# Patient Record
Sex: Female | Born: 1950 | Race: Black or African American | Hispanic: No | Smoking: Never smoker
Health system: Southern US, Community
[De-identification: ages and names within clinical notes are randomized; demographics above are authoritative.]

## PROBLEM LIST (undated history)

## (undated) DIAGNOSIS — C7951 Secondary malignant neoplasm of bone: Secondary | ICD-10-CM

## (undated) DIAGNOSIS — R011 Cardiac murmur, unspecified: Secondary | ICD-10-CM

## (undated) DIAGNOSIS — R600 Localized edema: Secondary | ICD-10-CM

## (undated) DIAGNOSIS — F419 Anxiety disorder, unspecified: Secondary | ICD-10-CM

## (undated) DIAGNOSIS — E785 Hyperlipidemia, unspecified: Secondary | ICD-10-CM

## (undated) DIAGNOSIS — E119 Type 2 diabetes mellitus without complications: Secondary | ICD-10-CM

## (undated) DIAGNOSIS — M1990 Unspecified osteoarthritis, unspecified site: Secondary | ICD-10-CM

## (undated) DIAGNOSIS — R609 Edema, unspecified: Secondary | ICD-10-CM

## (undated) DIAGNOSIS — R5383 Other fatigue: Secondary | ICD-10-CM

## (undated) HISTORY — PX: COLONOSCOPY: SHX174

## (undated) HISTORY — PX: CHOLECYSTECTOMY: SHX55

## (undated) HISTORY — PX: LAPAROSCOPIC ABDOMINAL EXPLORATION: SHX6249

---

## 2014-10-06 ENCOUNTER — Emergency Department: Payer: Self-pay | Admitting: Emergency Medicine

## 2020-03-21 DIAGNOSIS — E7800 Pure hypercholesterolemia, unspecified: Secondary | ICD-10-CM | POA: Insufficient documentation

## 2020-03-21 DIAGNOSIS — E1169 Type 2 diabetes mellitus with other specified complication: Secondary | ICD-10-CM | POA: Insufficient documentation

## 2020-04-02 ENCOUNTER — Encounter: Payer: Medicare PPO | Attending: Physician Assistant | Admitting: Physician Assistant

## 2020-04-02 ENCOUNTER — Other Ambulatory Visit: Payer: Self-pay

## 2020-04-02 DIAGNOSIS — Z87891 Personal history of nicotine dependence: Secondary | ICD-10-CM | POA: Diagnosis not present

## 2020-04-02 DIAGNOSIS — M1610 Unilateral primary osteoarthritis, unspecified hip: Secondary | ICD-10-CM | POA: Insufficient documentation

## 2020-04-02 DIAGNOSIS — E11622 Type 2 diabetes mellitus with other skin ulcer: Secondary | ICD-10-CM | POA: Diagnosis not present

## 2020-04-02 DIAGNOSIS — L98492 Non-pressure chronic ulcer of skin of other sites with fat layer exposed: Secondary | ICD-10-CM | POA: Insufficient documentation

## 2020-04-03 NOTE — Progress Notes (Signed)
Kristy York, Kristy York ([id number]) Visit Report for 04/02/2020 Abuse/Suicide Risk Screen Details Patient Name: Kristy York, Kristy York Date of Service: 04/02/2020 12:45 PM Medical Record Number: [id number] Patient Account Number: [id number] Date of Birth/Sex: 1951-07-08 (69 y.o. F) Treating RN: Cornell Barman Primary Care Vibhav Waddill: Dion Body Other Clinician: Referring Lynda Wanninger: Dion Body Treating Mickaela Starlin/Extender: Melburn Hake, HOYT Weeks in Treatment: 0 Abuse/Suicide Risk Screen Items Answer ABUSE RISK SCREEN: Has anyone close to you tried to hurt or harm you recentlyo No Do you feel uncomfortable with anyone in your familyo No Has anyone forced you do things that you didnot want to doo No Electronic Signature(s) Signed: 04/03/2020 12:55:53 PM By: Gretta Cool, BSN, RN, CWS, Kim RN, BSN Entered By: Gretta Cool, BSN, RN, CWS, Kim on 04/02/2020 13:08:31 Kristy York ([id number]) -------------------------------------------------------------------------------- Activities of Daily Living Details Patient Name: Kristy York Date of Service: 04/02/2020 12:45 PM Medical Record Number: [id number] Patient Account Number: [id number] Date of Birth/Sex: [birth year] (69 y.o. F) Treating RN: Cornell Barman Primary Care Makyna Niehoff: Dion Body Other Clinician: Referring Curly Mackowski: Dion Body Treating Baillie Mohammad/Extender: Melburn Hake, HOYT Weeks in Treatment: 0 Activities of Daily Living Items Answer Activities of Daily Living (Please select one for each item) Drive Automobile Completely Able Take Medications Completely Able Use Telephone Completely Able Care for Appearance Completely Able Use Toilet Completely Able Bath / Shower Completely Able Dress Self Completely Able Feed Self Completely Able Walk Completely Able Get In / Out Bed Completely Able Housework Completely Able Prepare Meals Completely [place] for Self Completely Able Electronic Signature(s) Signed: 04/03/2020  12:55:53 PM By: Gretta Cool, BSN, RN, CWS, Kim RN, BSN Entered By: Gretta Cool, BSN, RN, CWS, Kim on 04/02/2020 13:08:42 Kristy York ([id number]) -------------------------------------------------------------------------------- Education Screening Details Patient Name: Kristy York Date of Service: 04/02/2020 12:45 PM Medical Record Number: [id number] Patient Account Number: [id number] Date of Birth/Sex: 06-25-1951 (69 y.o. F) Treating RN: Cornell Barman Primary Care Aubrianne Molyneux: Dion Body Other Clinician: Referring Roxy Filler: Dion Body Treating Nichola Warren/Extender: Melburn Hake, HOYT Weeks in Treatment: 0 Primary Learner Assessed: Patient Learning Preferences/Education Level/Primary Language Learning Preference: Explanation, Demonstration Highest Education Level: High School Preferred Language: English Cognitive Barrier Language Barrier: No Translator Needed: No Memory Deficit: No Emotional Barrier: No Cultural/Religious Beliefs Affecting Medical Care: No Physical Barrier Impaired Vision: No Impaired Hearing: No Decreased Hand dexterity: No Knowledge/Comprehension Knowledge Level: Medium Comprehension Level: Medium Ability to understand written instructions: Medium Ability to understand verbal instructions: Medium Motivation Anxiety Level: Calm Cooperation: Cooperative Education Importance: Acknowledges Need Interest in Health Problems: Asks Questions Perception: Coherent Willingness to Engage in Self-Management High Activities: Readiness to Engage in Self-Management High Activities: Engineer, maintenance) Signed: 04/03/2020 12:55:53 PM By: Gretta Cool, BSN, RN, CWS, Kim RN, BSN Entered By: Gretta Cool, BSN, RN, CWS, Kim on 04/02/2020 13:09:17 Kristy York ([id number]) -------------------------------------------------------------------------------- Fall Risk Assessment Details Patient Name: Kristy York Date of Service: 04/02/2020 12:45 PM Medical Record Number: [id number] Patient  Account Number: [id number] Date of Birth/Sex: 08/12/1951 (69 y.o. F) Treating RN: Cornell Barman Primary Care Dulcy Sida: Dion Body Other Clinician: Referring Benuel Ly: Dion Body Treating Nyra Anspaugh/Extender: Melburn Hake, HOYT Weeks in Treatment: 0 Fall Risk Assessment Items Have you had 2 or more falls in the last 12 monthso 0 No Have you had any fall that resulted in injury in the last 12 monthso 0 No FALLS RISK SCREEN History of falling - immediate or within 3 months 0 No Secondary diagnosis (Do you have 2 or more medical diagnoseso) 0 No Ambulatory aid None/bed rest/wheelchair/nurse 0 Yes Crutches/cane/walker 0 No Furniture 0 No  Intravenous therapy Access/Saline/Heparin Lock 0 No Gait/Transferring Normal/ bed rest/ wheelchair 0 Yes Weak (short steps with or without shuffle, stooped but able to lift head while walking, may 0 No seek support from furniture) Impaired (short steps with shuffle, may have difficulty arising from chair, head down, impaired 0 No balance) Mental Status Oriented to own ability 0 Yes Electronic Signature(s) Signed: 04/03/2020 12:55:53 PM By: Gretta Cool, BSN, RN, CWS, Kim RN, BSN Entered By: Gretta Cool, BSN, RN, CWS, Kim on 04/02/2020 13:09:28 Kristy York ([id number]) -------------------------------------------------------------------------------- Foot Assessment Details Patient Name: Kristy York Date of Service: 04/02/2020 12:45 PM Medical Record Number: [id number] Patient Account Number: [id number] Date of Birth/Sex: [birth year] (69 y.o. F) Treating RN: Cornell Barman Primary Care Lashea Goda: Dion Body Other Clinician: Referring Hudsyn Champine: Dion Body Treating Ashlin Kreps/Extender: Melburn Hake, HOYT Weeks in Treatment: 0 Foot Assessment Items Site Locations + = Sensation present, - = Sensation absent, C = Callus, U = Ulcer R = Redness, W = Warmth, M = Maceration, PU = Pre-ulcerative lesion F = Fissure, S = Swelling, D =  Dryness Assessment Right: Left: Other Deformity: No No Prior Foot Ulcer: No No Prior Amputation: No No Charcot Joint: No No Ambulatory Status: Ambulatory Without Help Gait: Steady Electronic Signature(s) Signed: 04/03/2020 12:55:53 PM By: Gretta Cool, BSN, RN, CWS, Kim RN, BSN Entered By: Gretta Cool, BSN, RN, CWS, Kim on 04/02/2020 13:09:48 Kristy York ([id number]) -------------------------------------------------------------------------------- Nutrition Risk Screening Details Patient Name: Kristy York Date of Service: 04/02/2020 12:45 PM Medical Record Number: [id number] Patient Account Number: [id number] Date of Birth/Sex: 1951-08-05 (69 y.o. F) Treating RN: Cornell Barman Primary Care Caroleena Paolini: Dion Body Other Clinician: Referring Josue Kass: Dion Body Treating Chalisa Kobler/Extender: Melburn Hake, HOYT Weeks in Treatment: 0 Height (in): 63 Weight (lbs): 168 Body Mass Index (BMI): 29.8 Nutrition Risk Screening Items Score Screening NUTRITION RISK SCREEN: I have an illness or condition that made me change the kind and/or amount of food I eat 0 No I eat fewer than two meals per day 0 No I eat few fruits and vegetables, or milk products 0 No I have three or more drinks of beer, liquor or wine almost every day 0 No I have tooth or mouth problems that make it hard for me to eat 0 No I don't always have enough money to buy the food I need 0 No I eat alone most of the time 0 No I take three or more different prescribed or over-the-counter drugs a day 0 No Without wanting to, I have lost or gained 10 pounds in the last six months 0 No I am not always physically able to shop, cook and/or feed myself 0 No Nutrition Protocols Good Risk Protocol 0 No interventions needed Moderate Risk Protocol High Risk Proctocol Risk Level: Good Risk Score: 0 Electronic Signature(s) Signed: 04/03/2020 12:55:53 PM By: Gretta Cool, BSN, RN, CWS, Kim RN, BSN Entered By: Gretta Cool, BSN, RN, CWS, Kim on 04/02/2020  13:09:37

## 2020-04-03 NOTE — Progress Notes (Signed)
Kristy York, Kristy York ([id number]) Visit Report for 04/02/2020 Allergy List Details Patient Name: Kristy York, Kristy York Date of Service: 04/02/2020 12:45 PM Medical Record Number: [id number] Patient Account Number: [id number] Date of Birth/Sex: 1951-06-22 (69 y.o. F) Treating RN: Kristy York Primary Care Kristy York: Kristy York Other Clinician: Referring Kristy York: Kristy York Treating Kristy York/Extender: Kristy York Weeks in Treatment: 0 Allergies Active Allergies No Known Drug Allergies Allergy Notes Electronic Signature(s) Signed: 04/03/2020 12:55:53 PM By: Kristy York, BSN, RN, CWS, Kristy York Entered By: Kristy York, BSN, RN, CWS, Kristy on 04/02/2020 13:04:58 Kristy York ([id number]) -------------------------------------------------------------------------------- Arrival Information Details Patient Name: Kristy York Date of Service: 04/02/2020 12:45 PM Medical Record Number: [id number] Patient Account Number: [id number] Date of Birth/Sex: 29-Sep-1951 (69 y.o. F) Treating RN: Kristy York Primary Care Kristy York: Kristy York Other Clinician: Referring Kristy York: Kristy York Treating Kristy York/Extender: Kristy York Weeks in Treatment: 0 Visit Information Patient Arrived: Kristy York Arrival Time: 12:51 Accompanied By: self Transfer Assistance: None Patient Identification Verified: Yes Secondary Verification Process Completed: Yes Electronic Signature(s) Signed: 04/02/2020 4:25:49 PM By: Kristy York Entered By: Kristy Bears on 04/02/2020 12:52:08 Kristy York ([id number]) -------------------------------------------------------------------------------- Clinic Level of Care Assessment Details Patient Name: Kristy York Date of Service: 04/02/2020 12:45 PM Medical Record Number: [id number] Patient Account Number: [id number] Date of Birth/Sex: Jan 17, 1951 (69 y.o. F) Treating RN: Kristy York Primary Care Kristy York: Kristy York Other  Clinician: Referring Kristy York: Kristy York Treating Kristy York/Extender: Kristy York Weeks in Treatment: 0 Clinic Level of Care Assessment Items TOOL 2 Quantity Score []  - Use when only an EandM is performed on the INITIAL visit 0 ASSESSMENTS - Nursing Assessment / Reassessment X - General Physical Exam (combine w/ comprehensive assessment (listed just below) when performed on new 1 20 pt. evals) X- 1 25 Comprehensive Assessment (HX, ROS, Risk Assessments, Wounds Hx, etc.) ASSESSMENTS - Wound and Skin Assessment / Reassessment X - Simple Wound Assessment / Reassessment - one wound 1 5 []  - 0 Complex Wound Assessment / Reassessment - multiple wounds []  - 0 Dermatologic / Skin Assessment (not related to wound area) ASSESSMENTS - Ostomy and/or Continence Assessment and Care []  - Incontinence Assessment and Management 0 []  - 0 Ostomy Care Assessment and Management (repouching, etc.) PROCESS - Coordination of Care X - Simple Patient / Family Education for ongoing care 1 15 []  - 0 Complex (extensive) Patient / Family Education for ongoing care X- 1 10 Staff obtains Programmer, systems, Records, Test Results / Process Orders []  - 0 Staff telephones HHA, Nursing Homes / Clarify orders / etc []  - 0 Routine Transfer to another Facility (non-emergent condition) []  - 0 Routine Hospital Admission (non-emergent condition) []  - 0 New Admissions / Biomedical engineer / Ordering NPWT, Apligraf, etc. []  - 0 Emergency Hospital Admission (emergent condition) X- 1 10 Simple Discharge Coordination []  - 0 Complex (extensive) Discharge Coordination PROCESS - Special Needs []  - Pediatric / Minor Patient Management 0 []  - 0 Isolation Patient Management []  - 0 Hearing / Language / Visual special needs []  - 0 Assessment of Community assistance (transportation, D/C planning, etc.) []  - 0 Additional assistance / Altered mentation []  - 0 Support Surface(s) Assessment (bed, cushion, seat,  etc.) INTERVENTIONS - Wound Cleansing / Measurement X - Wound Imaging (photographs - any number of wounds) 1 5 []  - 0 Wound Tracing (instead of photographs) X- 1 5 Simple Wound Measurement - one wound []  - 0 Complex Wound Measurement - multiple wounds Kristy York ([id number]) X- 1 5 Simple Wound Cleansing -  one wound []  - 0 Complex Wound Cleansing - multiple wounds INTERVENTIONS - Wound Dressings []  - Small Wound Dressing one or multiple wounds 0 []  - 0 Medium Wound Dressing one or multiple wounds X- 1 20 Large Wound Dressing one or multiple wounds []  - 0 Application of Medications - injection INTERVENTIONS - Miscellaneous []  - External ear exam 0 []  - 0 Specimen Collection (cultures, biopsies, blood, York fluids, etc.) []  - 0 Specimen(s) / Culture(s) sent or taken to Lab for analysis []  - 0 Patient Transfer (multiple staff / Kristy York Lift / Similar devices) []  - 0 Simple Staple / Suture removal (25 or less) []  - 0 Complex Staple / Suture removal (26 or more) []  - 0 Hypo / Hyperglycemic Management (close monitor of Blood Glucose) []  - 0 Ankle / Brachial Index (ABI) - do not check if billed separately Has the patient been seen at the hospital within the last three years: Yes Total Score: 120 Level Of Care: New/Established - Level 4 Electronic Signature(s) Signed: 04/03/2020 12:55:53 PM By: Kristy York, BSN, RN, CWS, Kristy York Entered By: Kristy York, BSN, RN, CWS, Kristy on 04/02/2020 17:28:26 Kristy York ([id number]) -------------------------------------------------------------------------------- Encounter Discharge Information Details Patient Name: Kristy York Date of Service: 04/02/2020 12:45 PM Medical Record Number: [id number] Patient Account Number: [id number] Date of Birth/Sex: Jul 11, 1951 (69 y.o. F) Treating RN: Kristy York Primary Care Derel York: Kristy York Other Clinician: Referring Kristy York: Kristy York Treating Kristy York/Extender: Kristy York Weeks  in Treatment: 0 Encounter Discharge Information Items Post Procedure Vitals Discharge Condition: Stable Temperature (F): 98.4 Ambulatory Status: Ambulatory Pulse (bpm): 86 Discharge Destination: Home Respiratory Rate (breaths/min): 18 Transportation: Other Blood Pressure (mmHg): 139/72 Accompanied By: self Schedule Follow-up Appointment: Yes Clinical Summary of Care: Electronic Signature(s) Signed: 04/02/2020 5:33:18 PM By: Kristy York, BSN, RN, CWS, Kristy York Entered By: Kristy York, BSN, RN, CWS, Kristy on 04/02/2020 17:33:18 Kristy York ([id number]) -------------------------------------------------------------------------------- Lower Extremity Assessment Details Patient Name: Kristy York Date of Service: 04/02/2020 12:45 PM Medical Record Number: [id number] Patient Account Number: [id number] Date of Birth/Sex: [birth year] (69 y.o. F) Treating RN: Kristy York Primary Care Zalaya Astarita: Kristy York Other Clinician: Referring Gelena Klosinski: Kristy York Treating Raegyn Renda/Extender: Kristy York Weeks in Treatment: 0 Electronic Signature(s) Signed: 04/03/2020 12:55:53 PM By: Kristy York, BSN, RN, CWS, Kristy York Entered By: Kristy York, BSN, RN, CWS, Kristy on 04/02/2020 13:40:39 Kristy York ([id number]) -------------------------------------------------------------------------------- Multi Wound Chart Details Patient Name: Kristy York Date of Service: 04/02/2020 12:45 PM Medical Record Number: [id number] Patient Account Number: [id number] Date of Birth/Sex: 06-23-1951 (69 y.o. F) Treating RN: Kristy York Primary Care Antoine Fiallos: Kristy York Other Clinician: Referring Anaclara Acklin: Kristy York Treating Margia Wiesen/Extender: Kristy York Weeks in Treatment: 0 Vital Signs Height(in): 63 Pulse(bpm): 70 Weight(lbs): 168 Blood Pressure(mmHg): 139/72 York Mass Index(BMI): 30 Temperature(F): 98.4 Respiratory Rate(breaths/min): 18 Photos: [N/A:N/A] Wound Location: Right Breast N/A  N/A Wounding Event: Gradually Appeared N/A N/A Primary Etiology: Malignant Wound N/A N/A Comorbid History: Type II Diabetes, Osteoarthritis N/A N/A Date Acquired: 03/02/2020 N/A N/A Weeks of Treatment: 0 N/A N/A Wound Status: Open N/A N/A Measurements L x W x D (cm) 2x5x1.5 N/A N/A Area (cm) : 7.854 N/A N/A Volume (cm) : 11.781 N/A N/A % Reduction in Area: 0.00% N/A N/A % Reduction in Volume: 0.00% N/A N/A Classification: Unclassifiable N/A N/A Exudate Amount: Large N/A N/A Exudate Type: Sanguinous N/A N/A Exudate Color: red N/A N/A Wound Margin: Thickened N/A N/A Granulation Amount: None Present (0%) N/A N/A Necrotic Amount: Large (67-100%) N/A N/A Necrotic Tissue: Eschar,  Adherent Slough N/A N/A Exposed Structures: Fat Layer (Subcutaneous Tissue) N/A N/A Exposed: Yes Epithelialization: None N/A N/A Treatment Notes Electronic Signature(s) Signed: 04/02/2020 5:26:52 PM By: Kristy York, BSN, RN, CWS, Kristy York Entered By: Kristy York, BSN, RN, CWS, Kristy on 04/02/2020 17:26:52 Kristy York ([id number]) -------------------------------------------------------------------------------- Pain Assessment Details Patient Name: Kristy York Date of Service: 04/02/2020 12:45 PM Medical Record Number: [id number] Patient Account Number: [id number] Date of Birth/Sex: [birth year] (69 y.o. F) Treating RN: Kristy York Primary Care Reggie Bise: Kristy York Other Clinician: Referring Brianah Hopson: Kristy York Treating Quron Ruddy/Extender: Kristy York Weeks in Treatment: 0 Active Problems Location of Pain Severity and Description of Pain Patient Has Paino No Site Locations Pain Management and Medication Current Pain Management: Electronic Signature(s) Signed: 04/02/2020 4:25:49 PM By: Kristy York Signed: 04/03/2020 12:55:53 PM By: Kristy York, BSN, RN, CWS, Kristy York Entered By: Kristy Bears on 04/02/2020 12:52:22 Kristy York  ([id number]) -------------------------------------------------------------------------------- Patient/Caregiver Education Details Patient Name: Kristy York Date of Service: 04/02/2020 12:45 PM Medical Record Number: [id number] Patient Account Number: [id number] Date of Birth/Gender: 1950-10-31 (69 y.o. F) Treating RN: Kristy York Primary Care Physician: Kristy York Other Clinician: Referring Physician: Dion York Treating Physician/Extender: Sharalyn Ink in Treatment: 0 Education Assessment Education Provided To: Patient Education Topics Provided Malignant/Atypical Wounds: Handouts: Malignant/Atypical Wounds, Other: wound care Methods: Demonstration Responses: State content correctly Electronic Signature(s) Signed: 04/03/2020 12:55:53 PM By: Kristy York, BSN, RN, CWS, Kristy York Entered By: Kristy York, BSN, RN, CWS, Kristy on 04/02/2020 17:29:23 Kristy York ([id number]) -------------------------------------------------------------------------------- Wound Assessment Details Patient Name: Kristy York Date of Service: 04/02/2020 12:45 PM Medical Record Number: [id number] Patient Account Number: [id number] Date of Birth/Sex: [birth year] (69 y.o. 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 Wound Status: Open Wounding Event: Gradually Appeared Comorbid History: Type II Diabetes, Osteoarthritis Date Acquired: 03/02/2020 Weeks Of Treatment: 0 Clustered Wound: No Photos Wound Measurements Length: (cm) 2 Width: (cm) 5 Depth: (cm) 1.5 Area: (cm) 7.854 Volume: (cm) 11.781 % Reduction in Area: 0% % Reduction in Volume: 0% Epithelialization: None Tunneling: No Undermining: No Wound Description Classification:  Unclassifiable Wound Margin: Thickened Exudate Amount: Large Exudate Type: Sanguinous Exudate Color: red Foul Odor After Cleansing: Yes Due to Product Use: No Slough/Fibrino Yes Wound Bed Granulation Amount: None Present (0%) Exposed Structure Necrotic Amount: Large (67-100%) Fat Layer (Subcutaneous Tissue) Exposed: Yes Necrotic Quality: Eschar, Adherent Therapist, music) Signed: 04/02/2020 5:31:30 PM By: Kristy York, BSN, RN, CWS, Kristy York Previous Signature: 04/02/2020 1:21:18 PM Version By: Kristy York, BSN, RN, CWS, Kristy York Entered By: Kristy York, BSN, RN, CWS, Kristy on 04/02/2020 17:31:30 Kristy York ([id number]) -------------------------------------------------------------------------------- Vitals Details Patient Name: Kristy York Date of Service: 04/02/2020 12:45 PM Medical Record Number: [id number] Patient Account Number: [id number] Date of Birth/Sex: Feb 27, 1951 (69 y.o. F) Treating RN: Kristy York Primary Care Lashay Osborne: Kristy York Other Clinician: Referring Cailee Blanke: Kristy York Treating Bryton Waight/Extender: Kristy York Weeks in Treatment: 0 Vital Signs Time Taken: 13:50 Temperature (F): 98.4 Height (in): 63 Pulse (bpm): 86 Source: Stated Respiratory Rate (breaths/min): 18 Weight (lbs): 168 Blood Pressure (mmHg): 139/72 Source: Measured Reference Range: 80 - 120 mg / dl York Mass Index (BMI): 29.8 Electronic Signature(s) Signed: 04/02/2020 4:25:49 PM By: Kristy York Entered By: Kristy Bears on 04/02/2020 12:55:40

## 2020-04-04 NOTE — Progress Notes (Signed)
RAECHAL, RABEN ([id number]) Visit Report for 04/02/2020 Chief Complaint Document Details Patient Name: Kristy York, Kristy York Date of Service: 04/02/2020 12:45 PM Medical Record Number: [id number] Patient Account Number: [id number] Date of Birth/Sex: [birth year] (69 y.o. F) Treating RN: Cornell Barman Primary Care Provider: Dion Body Other Clinician: Referring Provider: Dion Body Treating Provider/Extender: Melburn Hake, Dominique Calvey Weeks in Treatment: 0 Information Obtained from: Patient Chief Complaint Right Breast Ulcer Electronic Signature(s) Signed: 04/02/2020 2:03:05 PM By: Worthy Keeler PA-C Entered By: Worthy Keeler on 04/02/2020 14:03:05 Kristy York ([id number]) -------------------------------------------------------------------------------- Debridement Details Patient Name: Kristy York Date of Service: 04/02/2020 12:45 PM Medical Record Number: [id number] Patient Account Number: [id number] Date of Birth/Sex: 11/07/1950 (69 y.o. F) Treating RN: Cornell Barman Primary Care Provider: Dion Body Other Clinician: Referring Provider: Dion Body Treating Provider/Extender: Melburn Hake, Elexia Friedt Weeks in Treatment: 0 Debridement Performed for Wound #1 Right Breast Assessment: Performed By: Physician STONE III, Lorice Lafave E., PA-C Debridement Type: Chemical/Enzymatic/Mechanical Agent Used: saline and gauze Level of Consciousness (Pre- Awake and Alert procedure): Pre-procedure Verification/Time Out Yes - 13:00 Taken: Instrument: Other : saline and gauze Bleeding: Moderate Hemostasis Achieved: Pressure Response to Treatment: Procedure was tolerated well Level of Consciousness (Post- Awake and Alert procedure): Post Debridement Measurements of Total Wound Length: (cm) 2 Width: (cm) 5 Depth: (cm) 1.5 Volume: (cm) 11.781 Character of Wound/Ulcer Post Debridement: Stable Post Procedure Diagnosis Same as Pre-procedure Electronic Signature(s) Signed: 04/02/2020 5:30:55 PM  By: Gretta Cool, BSN, RN, CWS, Kim RN, BSN Signed: 04/03/2020 5:22:40 PM By: Worthy Keeler PA-C Entered By: Gretta Cool, BSN, RN, CWS, Kim on 04/02/2020 17:30:54 Kristy York ([id number]) -------------------------------------------------------------------------------- HPI Details Patient Name: Kristy York Date of Service: 04/02/2020 12:45 PM Medical Record Number: [id number] Patient Account Number: [id number] Date of Birth/Sex: 1951/02/21 (69 y.o. F) Treating RN: Cornell Barman Primary Care Provider: Dion Body Other Clinician: Referring Provider: Dion Body Treating Provider/Extender: Melburn Hake, Delane Wessinger Weeks in Treatment: 0 History of Present Illness HPI Description: 04/02/2020 patient presents today for initial evaluation in our clinic as a referral from Dr. Netty Starring her primary care provider. She has an ulceration on her right breast which apparently has been present for at least a month although some of what she keeps telling me about ignoring this for a bit longer because she was afraid to go get it checked out makes me feel like that she has had something on this breast region affected for quite some time prior. With that being said there does not appear to be any obvious signs of infection based on what I am seeing today and I am most concerned to be honest about the possibility of breast cancer based on my evaluation today. She does have a history of diabetes mellitus type 2 but states no other major medical problems per review of systems which was reviewed today as well. She does have a family history of cancer although I am not sure what kind this was in her siblings. She does not have any personal history of cancer however. In fact I was not really able to inquire and asked her about this today simply due to the fact that to be honest she told me "I do not want you to give me any bad news". She subsequently only asked me one question as far as my opinion of the situation was  concerned whether or not I thought she was going to have to have surgery to which I answered yes. Other than that she would not really let me talk to her about  anything else also offered to talk to her son but she did not want me to really tell him anything either. They also during the discussion when I suggested that the ER would be the best place for her to go really did not want to take that route they wanted to just hold off and have her seen by her primary care provider and let them manage the situation. With that being said I am going to try to get in touch with them but nonetheless my recommendation had been to have her go to the ER for more expeditious care and imaging. Electronic Signature(s) Signed: 04/02/2020 2:04:04 PM By: Worthy Keeler PA-C Entered By: Worthy Keeler on 04/02/2020 14:04:03 Kristy York ([id number]) -------------------------------------------------------------------------------- Physical Exam Details Patient Name: Kristy York Date of Service: 04/02/2020 12:45 PM Medical Record Number: [id number] Patient Account Number: [id number] Date of Birth/Sex: [birth year] (69 y.o. F) Treating RN: Cornell Barman Primary Care Provider: Dion Body Other Clinician: Referring Provider: Dion Body Treating Provider/Extender: Melburn Hake, Cristen Bredeson Weeks in Treatment: 0 Constitutional patient is hypertensive.. pulse regular and within target range for patient.Marland Kitchen respirations regular, non-labored and within target range for patient.Marland Kitchen temperature within target range for patient.. Well-nourished and well-hydrated in no acute distress. Eyes conjunctiva clear no eyelid edema noted. pupils equal round and reactive to light and accommodation. Ears, Nose, Mouth, and Throat no gross abnormality of ear auricles or external auditory canals. normal hearing noted during conversation. mucus membranes moist. Respiratory normal breathing without difficulty. Cardiovascular no clubbing,  cyanosis, significant edema, <3 sec cap refill. Musculoskeletal Patient unable to walk without assistance. Psychiatric this patient is able to make decisions and demonstrates good insight into disease process. Alert and Oriented x 3. patient is agitated. Notes Upon inspection patient's wound bed actually showed signs of significant necrotic tissue at the base of the wound but again no debridement was attempted simply due to the fact that I do believe this is a cancerous lesion I do not believe that she would be benefited by sharp debridement and to be honest I do not see any signs of infection either. 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 With that being said I do not have any testing at this point in order to identify this for certain as such. Electronic Signature(s) Signed: 04/02/2020 2:05:12 PM By: Worthy Keeler PA-C Entered By: Worthy Keeler on 04/02/2020 14:05:10 Kristy York ([id number]) -------------------------------------------------------------------------------- Physician Orders Details Patient Name: Kristy York Date of Service: 04/02/2020 12:45 PM Medical Record Number: [id number] Patient Account Number: [id number] Date of Birth/Sex: 29-Apr-1951 (69 y.o. F) Treating RN: Cornell Barman Primary Care Provider: Dion Body Other Clinician: Referring Provider: Dion Body Treating Provider/Extender: Melburn Hake, Rae Sutcliffe Weeks in Treatment: 0 Verbal / Phone Orders: No Diagnosis Coding ICD-10 Coding Code Description L98.492 Non-pressure chronic ulcer of skin of other sites with fat layer exposed E11.622 Type 2 diabetes mellitus with other skin ulcer Wound Cleansing Wound #1 Right Breast o Cleanse wound with mild soap and water - Dial antibacterial soap Primary Wound Dressing Wound #1 Right Breast o Silver Alginate Secondary Dressing Wound #1 Right Breast o Other -  Carboflex and a boarder foam dressing Notes Care transferred to PCP per patient request. patient is a consult only today. Electronic Signature(s) Signed: 04/02/2020 2:08:46 PM By: Worthy Keeler PA-C Entered By: Worthy Keeler on 04/02/2020 14:08:45 Kristy York ([id number]) -------------------------------------------------------------------------------- Problem List Details Patient Name: Kristy York,  Kristy York Date of Service: 04/02/2020 12:45 PM Medical Record Number: [id number] Patient Account Number: [id number] Date of Birth/Sex: 01/11/1951 (69 y.o. F) Treating RN: Cornell Barman Primary Care Provider: Dion Body Other Clinician: Referring Provider: Dion Body Treating Provider/Extender: Melburn Hake, Estephany Perot Weeks in Treatment: 0 Active Problems ICD-10 Encounter Code Description Active Date MDM Diagnosis L98.492 Non-pressure chronic ulcer of skin of other sites with fat layer exposed 04/02/2020 No Yes E11.622 Type 2 diabetes mellitus with other skin ulcer 04/02/2020 No Yes Inactive Problems Resolved Problems Electronic Signature(s) Signed: 04/02/2020 2:01:48 PM By: Worthy Keeler PA-C Entered By: Worthy Keeler on 04/02/2020 14:01:48 Kristy York ([id number]) -------------------------------------------------------------------------------- Progress Note Details Patient Name: Kristy York Date of Service: 04/02/2020 12:45 PM Medical Record Number: [id number] Patient Account Number: [id number] Date of Birth/Sex: January 17, 1951 (69 y.o. F) Treating RN: Cornell Barman Primary Care Provider: Dion Body Other Clinician: Referring Provider: Dion Body Treating Provider/Extender: Melburn Hake, Shaunte Weissinger Weeks in Treatment: 0 Subjective Chief Complaint Information obtained from Patient Right Breast Ulcer History of Present Illness (HPI) 04/02/2020 patient presents today for initial evaluation in our clinic as a referral from Dr. Netty Starring her primary care provider. She has  an ulceration on her right breast which apparently has been present for at least a month although some of what she keeps telling me about ignoring this for a bit longer because she was afraid to go get it checked out makes me feel like that she has had something on this breast region affected for quite some time prior. With that being said there does not appear to be any obvious signs of infection based on what I am seeing today and I am most concerned to be honest about the possibility of breast cancer based on my evaluation today. She does have a history of diabetes mellitus type 2 but states no other major medical problems per review of systems which was reviewed today as well. She does have a family history of cancer although I am not sure what kind this was in her siblings. She does not have any personal history of cancer however. In fact I was not really able to inquire and asked her about this today simply due to the fact that to be honest she told me "I do not want you to give me any bad news". She subsequently only asked me one question as far as my opinion of the situation was concerned whether or not I thought she was going to have to have surgery to which I answered yes. Other than that she would not really let me talk to her about anything else also offered to talk to her son but she did not want me to really tell him anything either. They also during the discussion when I suggested that the ER would be the best place for her to go really did not want to take that route they wanted to just hold off and have her seen by her primary care provider and let them manage the situation. With that being said I am going to try to get in touch with them but nonetheless my recommendation had been to have her go to the ER for more expeditious care and imaging. Patient History Information obtained from Patient. Allergies No Known Drug Allergies Family History Cancer - Siblings, No family history  of Diabetes. Social History Former smoker, Marital Status - Divorced, Alcohol Use - Never, Drug Use - No History, Caffeine Use - Rarely. Medical History Endocrine Patient has history of Type  II Diabetes Musculoskeletal Patient has history of Osteoarthritis - Hip Medical And Surgical History Notes Endocrine Borderline Review of Systems (ROS) Eyes Complains or has symptoms of Glasses / Contacts. Denies complaints or symptoms of Dry Eyes, Vision Changes. Ear/Nose/Mouth/Throat Denies complaints or symptoms of Difficult clearing ears, Sinusitis. Hematologic/Lymphatic Denies complaints or symptoms of Bleeding / Clotting Disorders, Human Immunodeficiency Virus. Respiratory Denies complaints or symptoms of Chronic or frequent coughs, Shortness of Breath. Cardiovascular Denies complaints or symptoms of Chest pain, LE edema. Gastrointestinal Denies complaints or symptoms of Frequent diarrhea, Nausea, Vomiting. Genitourinary Denies complaints or symptoms of Kidney failure/ Dialysis, Incontinence/dribbling. Immunological Denies complaints or symptoms of Hives, Itching. Integumentary (Skin) Denies complaints or symptoms of Bleeding or bruising tendency. Kristy York, Kristy York ([id number]) Neurologic Denies complaints or symptoms of Numbness/parasthesias, Focal/Weakness. Psychiatric Denies complaints or symptoms of Anxiety, Claustrophobia. Objective Constitutional patient is hypertensive.. pulse regular and within target range for patient.Marland Kitchen respirations regular, non-labored and within target range for patient.Marland Kitchen temperature within target range for patient.. Well-nourished and well-hydrated in no acute distress. Vitals Time Taken: 1:50 PM, Height: 63 in, Source: Stated, Weight: 168 lbs, Source: Measured, BMI: 29.8, Temperature: 98.4 F, Pulse: 86 bpm, Respiratory Rate: 18 breaths/min, Blood Pressure: 139/72 mmHg. Eyes conjunctiva clear no eyelid edema noted. pupils equal round and reactive to light  and accommodation. Ears, Nose, Mouth, and Throat no gross abnormality of ear auricles or external auditory canals. normal hearing noted during conversation. mucus membranes moist. Respiratory normal breathing without difficulty. Cardiovascular no clubbing, cyanosis, significant edema, Musculoskeletal Patient unable to walk without assistance. Psychiatric this patient is able to make decisions and demonstrates good insight into disease process. Alert and Oriented x 3. patient is agitated. General Notes: Upon inspection patient's wound bed actually showed signs of significant necrotic tissue at the base of the wound but again no debridement was attempted simply due to the fact that I do believe this is a cancerous lesion I do not believe that she would be benefited by sharp debridement and to be honest I do not see any signs of infection either. 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 With that being said I do not have any testing at this point in order to identify this for certain as such. Integumentary (Hair, Skin) Wound #1 status is Open. Original cause of wound was Gradually Appeared. The wound is located on the Right Breast. The wound measures 2cm length x 5cm width x 1.5cm depth; 7.854cm^2 area and 11.781cm^3 volume. There is Fat Layer (Subcutaneous Tissue) Exposed exposed. There is no tunneling or undermining noted. There is a large amount of sanguinous drainage noted. The wound margin is thickened. There is no granulation within the wound bed. There is a large (67-100%) amount of necrotic tissue within the wound bed including Eschar and Adherent Slough. Assessment Active Problems ICD-10 Non-pressure chronic ulcer of skin of other sites with fat layer exposed Type 2 diabetes mellitus with other skin ulcer Plan Wound Cleansing: Wound #1 Right Breast: Cleanse wound with mild soap and water  - Dial antibacterial soap Primary Wound Dressing: Kristy York, Kristy York ([id number]) Wound #1 Right Breast: Silver Alginate Secondary Dressing: Wound #1 Right Breast: Other - Carboflex and a boarder foam dressing General Notes: Care transferred to PCP per patient request. patient is a consult only today. 1. I would recommend currently that the patient go to the ER for further evaluation and treatment specifically to have more rapid diagnosis by  way of imaging. With that being said the patient did not want to do this nor did her son want her to they want a wait till she sees her primary care provider. 2. I am going to call the primary care provider as I am dictating this note I was actually on hold for about 8 minutes in order to get in touch with him. I will continue to be on hold and relayed my opinion of what is going on to them as well. 3. 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 Obviously this will be better sooner rather than later especially since we do not know exactly how long this has been going on since the patient was reluctant to see anybody in the interim. 4. We did actually put silver alginate on this as well as CarboFlex and a border foam dressing for absorption purposes and to treat this as best we could. We will see about ordering supplies.could not perform a sharp debridement saline and gauze debridement was performed today. Not much of the slough was removed however. We will see the patient back for follow-up visit as needed as she really wants to transfer care to primary care and again this I do think is a cancerous lesion I believe that she is good to be under the care of oncology shortly if she decides to go that route. I was able to finally get in touch with Dr. Netty Starring and apparently the patient has seen him  just 1 time currently. I did discuss that I do not believe that this wound is good to heal with just wound care measures alone as I do believe that it is a cancerous lesion. Subsequently I did explain that the patient was wanting to go back and see him in order to order the appropriate testing required in order to manage the wound currently. I think the patient likely needs an ultrasound and/or mammogram in order to determine the extent of what is going on and where she needs to proceed from here. He noted all the above and told me that he had also discussed this with the patient but he was not sure they be able to do the testing without having the wound closed. I again reiterated that I am not sure that working to be able to get this to close being that it is a wound secondary to a cancerous lesion which is not doing well at all to be honest. Subsequently he is going to see her back and then have the discussion with her as far as getting the testing set up and moving forward according to what he told me today. With this could be seen the patient as a consult only as there is really not much more we can do we are can order her some dressing supplies until she gets into testing and likely the cancer center where she needs to be ASAP in my opinion. Electronic Signature(s) Signed: 04/02/2020 5:22:27 PM By: Worthy Keeler PA-C Previous Signature: 04/02/2020 2:16:33 PM Version By: Worthy Keeler PA-C Entered By: Worthy Keeler on 04/02/2020 17:22:27 Kristy York ([id number]) -------------------------------------------------------------------------------- ROS/PFSH Details Patient Name: Kristy York Date of Service: 04/02/2020 12:45 PM Medical Record Number: [id number] Patient Account Number: [id number] Date of Birth/Sex: [birth year] (69 y.o. F) Treating RN: Cornell Barman  Primary Care Provider: Dion Body Other Clinician: Referring Provider: Dion Body Treating Provider/Extender: Melburn Hake, Clarivel Callaway Weeks in Treatment: 0 Information Obtained From Patient Eyes Complaints and Symptoms: Positive for: Glasses / Contacts Negative for: Dry Eyes; Vision Changes Ear/Nose/Mouth/Throat Complaints and Symptoms: Negative for: Difficult clearing ears; Sinusitis Hematologic/Lymphatic Complaints and Symptoms: Negative for: Bleeding / Clotting Disorders; Human Immunodeficiency Virus Respiratory Complaints and Symptoms: Negative for: Chronic or frequent coughs; Shortness of Breath Cardiovascular Complaints and Symptoms: Negative for: Chest pain; LE edema Gastrointestinal Complaints and Symptoms: Negative for: Frequent diarrhea; Nausea; Vomiting Genitourinary Complaints and Symptoms: Negative for: Kidney failure/ Dialysis; Incontinence/dribbling Immunological Complaints and Symptoms: Negative for: Hives; Itching Integumentary (Skin) Complaints and Symptoms: Negative for: Bleeding or bruising tendency Neurologic Complaints and Symptoms: Negative for: Numbness/parasthesias; Focal/Weakness Psychiatric Complaints and Symptoms: Negative for: Anxiety; Kristy York, Kristy York ([id number]) Endocrine Medical History: Positive for: Type II Diabetes Past Medical History Notes: Borderline Musculoskeletal Medical History: Positive for: Osteoarthritis - Hip Oncologic Immunizations Pneumococcal Vaccine: Received Pneumococcal Vaccination: No Implantable Devices None Family and Social History Cancer: Yes - Siblings; Diabetes: No; Former smoker; Marital Status - Divorced; Alcohol Use: Never; Drug Use: No History; Caffeine Use: Rarely Electronic Signature(s) Signed: 04/02/2020 5:22:45 PM By: Worthy Keeler PA-C Signed: 04/03/2020 12:55:53 PM By: Gretta Cool, BSN, RN, CWS, Kim RN, BSN Entered By: Gretta Cool, BSN, RN, CWS, Kim on 04/02/2020 13:08:24 Kristy York ([id number]) -------------------------------------------------------------------------------- SuperBill Details Patient Name:  Kristy York Date of Service: 04/02/2020 Medical Record Number: [id number] Patient Account Number: [id number] Date of Birth/Sex: 1950/10/14 (69 y.o. F) Treating RN: Cornell Barman Primary Care Provider: Dion Body Other Clinician: Referring Provider: Dion Body Treating Provider/Extender: Melburn Hake, Jastin Fore Weeks in Treatment: 0 Diagnosis Coding ICD-10 Codes Code Description [phone] Non-pressure chronic ulcer of skin of other sites with fat layer exposed E11.622 Type 2 diabetes mellitus with other skin ulcer Facility Procedures CPT4 Code: 22336122 Description: 99214 - WOUND CARE VISIT-LEV 4 EST PT Modifier: Quantity: 1 Physician Procedures CPT4 Code: 4497530 Description: 05110 - WC PHYS LEVEL 4 - NEW PT Modifier: Quantity: 1 CPT4 Code: Description: ICD-10 Diagnosis Description L98.492 Non-pressure chronic ulcer of skin of other sites with fat layer expos E11.622 Type 2 diabetes mellitus with other skin ulcer Modifier: ed Quantity: Electronic Signature(s) Signed: 04/02/2020 5:28:55 PM By: Gretta Cool, BSN, RN, CWS, Kim RN, BSN Signed: 04/03/2020 5:22:40 PM By: Worthy Keeler PA-C Previous Signature: 04/02/2020 2:16:49 PM Version By: Worthy Keeler PA-C Entered By: Gretta Cool, BSN, RN, CWS, Kim on 04/02/2020 17:28:54

## 2020-04-16 ENCOUNTER — Other Ambulatory Visit: Payer: Self-pay | Admitting: Family Medicine

## 2020-04-16 DIAGNOSIS — L98499 Non-pressure chronic ulcer of skin of other sites with unspecified severity: Secondary | ICD-10-CM

## 2020-04-24 ENCOUNTER — Ambulatory Visit
Admission: RE | Admit: 2020-04-24 | Discharge: 2020-04-24 | Disposition: A | Discharge status: Home / Self Care | Payer: Medicare PPO | Source: Ambulatory Visit | Attending: Family Medicine | Admitting: Family Medicine

## 2020-04-24 DIAGNOSIS — L98499 Non-pressure chronic ulcer of skin of other sites with unspecified severity: Secondary | ICD-10-CM | POA: Diagnosis not present

## 2020-04-24 DIAGNOSIS — N6311 Unspecified lump in the right breast, upper outer quadrant: Secondary | ICD-10-CM | POA: Diagnosis not present

## 2020-04-26 ENCOUNTER — Other Ambulatory Visit: Payer: Self-pay | Admitting: Family Medicine

## 2020-04-26 DIAGNOSIS — R928 Other abnormal and inconclusive findings on diagnostic imaging of breast: Secondary | ICD-10-CM

## 2020-04-26 DIAGNOSIS — N6310 Unspecified lump in the right breast, unspecified quadrant: Secondary | ICD-10-CM

## 2020-05-02 ENCOUNTER — Ambulatory Visit
Admission: RE | Admit: 2020-05-02 | Discharge: 2020-05-02 | Disposition: A | Discharge status: Home / Self Care | Payer: Medicare PPO | Source: Ambulatory Visit | Attending: Family Medicine | Admitting: Family Medicine

## 2020-05-02 DIAGNOSIS — N6310 Unspecified lump in the right breast, unspecified quadrant: Secondary | ICD-10-CM | POA: Diagnosis present

## 2020-05-02 DIAGNOSIS — R928 Other abnormal and inconclusive findings on diagnostic imaging of breast: Secondary | ICD-10-CM | POA: Diagnosis not present

## 2020-05-02 DIAGNOSIS — C50919 Malignant neoplasm of unspecified site of unspecified female breast: Secondary | ICD-10-CM

## 2020-05-02 HISTORY — PX: BREAST BIOPSY: SHX20

## 2020-05-02 HISTORY — DX: Malignant neoplasm of unspecified site of unspecified female breast: C50.919

## 2020-05-07 ENCOUNTER — Encounter: Payer: Self-pay | Admitting: *Deleted

## 2020-05-07 DIAGNOSIS — C50911 Malignant neoplasm of unspecified site of right female breast: Secondary | ICD-10-CM

## 2020-05-07 NOTE — Progress Notes (Signed)
Received message from Electa Sniff, RN at [facility] that patient's son had been notified of his moms biopsy results and that he would be expecting the call from navigation.  Called patient's son [name] to review path and next steps.  He has requested Dr. Bary Castilla for surgical consult.  I have scheduled the patient to see Dr. Grayland Ormond on 05/08/20 and Dr. Bary Castilla on 05/09/20.  Will give educational literature at that time.  [name] is to call with any questions or needs.

## 2020-05-08 ENCOUNTER — Other Ambulatory Visit: Payer: Self-pay

## 2020-05-08 ENCOUNTER — Inpatient Hospital Stay: Payer: Medicare PPO

## 2020-05-08 ENCOUNTER — Encounter: Payer: Self-pay | Admitting: Oncology

## 2020-05-08 ENCOUNTER — Encounter: Payer: Self-pay | Admitting: *Deleted

## 2020-05-08 ENCOUNTER — Inpatient Hospital Stay: Payer: Medicare PPO | Attending: Oncology | Admitting: Oncology

## 2020-05-08 ENCOUNTER — Encounter (INDEPENDENT_AMBULATORY_CARE_PROVIDER_SITE_OTHER): Payer: Self-pay

## 2020-05-08 VITALS — BP 152/73 | HR 79 | Temp 98.0°F | Wt 167.7 lb

## 2020-05-08 DIAGNOSIS — C773 Secondary and unspecified malignant neoplasm of axilla and upper limb lymph nodes: Secondary | ICD-10-CM | POA: Diagnosis not present

## 2020-05-08 DIAGNOSIS — C7951 Secondary malignant neoplasm of bone: Secondary | ICD-10-CM | POA: Insufficient documentation

## 2020-05-08 DIAGNOSIS — Z853 Personal history of malignant neoplasm of breast: Secondary | ICD-10-CM | POA: Insufficient documentation

## 2020-05-08 DIAGNOSIS — E041 Nontoxic single thyroid nodule: Secondary | ICD-10-CM | POA: Insufficient documentation

## 2020-05-08 DIAGNOSIS — R590 Localized enlarged lymph nodes: Secondary | ICD-10-CM | POA: Diagnosis not present

## 2020-05-08 DIAGNOSIS — Z170 Estrogen receptor positive status [ER+]: Secondary | ICD-10-CM | POA: Diagnosis not present

## 2020-05-08 DIAGNOSIS — C50911 Malignant neoplasm of unspecified site of right female breast: Secondary | ICD-10-CM | POA: Diagnosis not present

## 2020-05-08 DIAGNOSIS — Z79811 Long term (current) use of aromatase inhibitors: Secondary | ICD-10-CM | POA: Insufficient documentation

## 2020-05-08 DIAGNOSIS — C50511 Malignant neoplasm of lower-outer quadrant of right female breast: Secondary | ICD-10-CM | POA: Diagnosis not present

## 2020-05-08 DIAGNOSIS — Z79899 Other long term (current) drug therapy: Secondary | ICD-10-CM | POA: Insufficient documentation

## 2020-05-08 DIAGNOSIS — F419 Anxiety disorder, unspecified: Secondary | ICD-10-CM | POA: Insufficient documentation

## 2020-05-08 NOTE — Progress Notes (Signed)
[place]  [phone] [phone]  ID: Kristy York OB: 26-Apr-1951  MR#: [id number]  IOE#:[id number]  Patient Care Team: Dion Body, MD as PCP - General (Family Medicine) Rico Junker, RN as Registered Nurse  CHIEF COMPLAINT: Locally advanced right breast cancer.  INTERVAL HISTORY: Patient is a 68 year old female who noticed a lump in her right breast several months ago and subsequently developed ulceration of the skin.  She was initially referred to wound care clinic, but subsequent biopsy revealed underlying malignancy.  She is anxious, but otherwise feels well.  She has no neurologic complaints.  She denies any recent fevers or illnesses.  She has a fair appetite, but denies weight loss.  She has no chest pain, shortness of breath, cough, or hemoptysis.  She denies any nausea, vomiting, constipation, or diarrhea.  She has no urinary complaints.  [name]  REVIEW OF SYSTEMS:   Review of Systems  Constitutional: Negative.  Negative for fever, malaise/fatigue and weight loss.  Respiratory: Negative.  Negative for cough, hemoptysis and shortness of breath.   Cardiovascular: Negative.  Negative for chest pain and leg swelling.  Gastrointestinal: Negative.  Negative for abdominal pain.  Genitourinary: Negative.  Negative for dysuria.  Musculoskeletal: Negative.  Negative for back pain.  Skin: Negative.  Negative for rash.  Neurological: Negative.  Negative for dizziness, focal weakness, weakness and headaches.  Psychiatric/Behavioral: The patient is nervous/anxious.     As per HPI. Otherwise, a complete review of systems is negative.  PAST MEDICAL HISTORY: History reviewed. No pertinent past medical history.  PAST SURGICAL HISTORY: Past Surgical History:  Procedure Laterality Date  . BREAST BIOPSY Right 05/02/2020   [place] Bx, Q clip, path pending  . BREAST BIOPSY Right 05/02/2020   [place]  Axilla Bx, path pending    FAMILY HISTORY: Family History  Problem Relation Age of Onset  . Breast cancer Sister     ADVANCED DIRECTIVES (Y/N):  N  HEALTH MAINTENANCE: Social History   Tobacco Use  . Smoking status: Never Smoker  . Smokeless tobacco: Never Used  Vaping Use  . Vaping Use: Never used  Substance Use Topics  . Alcohol use: Never  . Drug use: Not on file     Colonoscopy:  PAP:  Bone density:  Lipid panel:  No Known Allergies  No current outpatient medications on file.   No current facility-administered medications for this visit.    OBJECTIVE: Vitals:   05/08/20 0919  BP: (!) 152/73  Pulse: 79  Temp: 98 F (36.7 C)  SpO2: 99%     There is no height or weight on file to calculate BMI.    ECOG FS:0 - Asymptomatic  General: Well-developed, well-nourished, no acute distress. Eyes: Pink conjunctiva, anicteric sclera. HEENT: Normocephalic, moist mucous membranes. Breast: Patient declined exam today. Lungs: No audible wheezing or coughing. Heart: Regular rate and rhythm. Abdomen: Soft, nontender, no obvious distention. Musculoskeletal: No edema, cyanosis, or clubbing. Neuro: Alert, answering all questions appropriately. Cranial nerves grossly intact. Skin: No rashes or petechiae noted. Psych: Normal affect. Lymphatics: No cervical, calvicular, axillary or inguinal LAD.   LAB RESULTS:  No results found for: NA, K, CL, CO2, GLUCOSE, BUN, CREATININE, CALCIUM, PROT, ALBUMIN, AST, ALT, ALKPHOS, BILITOT, GFRNONAA, GFRAA  No results found for: WBC, NEUTROABS, HGB, HCT, MCV, PLT   STUDIES: US BREAST LTD UNI LEFT INC AXILLA  Result Date: 04/24/2020 CLINICAL DATA:  RIGHT breast wound for 3-6 months. EXAM: DIGITAL DIAGNOSTIC  BILATERAL MAMMOGRAM WITH CAD AND TOMO ULTRASOUND BILATERAL BREAST COMPARISON:  None. ACR Breast Density Category b: There are scattered areas of fibroglandular density. FINDINGS: RIGHT BREAST: Mammogram: RIGHT breast is largely  replaced by an ulcerating mass. There is marked skin and trabecular thickening, skin measuring up to 11 millimeters anteriorly. The central portion of the RIGHT breast shows an irregular mass with spiculated margins measuring at least 5.6 x 7.7 x 5.5 centimeters. This mass is contiguous with the thickened, irregular nipple. Large ulcerating cavity identified along the UPPER OUTER QUADRANT. Mammographic images were processed with CAD. Physical Exam: The RIGHT breast is replaced by a large mass. Along the superior portion of the RIGHT breast, there is a large open wound, measuring at least 10 centimeters in maximum diameter and 2 centimeters in depth. The RIGHT nipple is enlarged, thickened, and woody in appearance. The skin of the entire RIGHT breast is hyper pigmented and firm. I palpate enlarged RIGHT axillary lymph nodes. Ultrasound: Ultrasound of the RIGHT breast is limited by the open wound. Limited ultrasound along the inferior and LATERAL portions are RIGHT breast show marked skin thickening. The mass suspected in the central portion of the RIGHT breast is not well seen sonographically due to marked breast edema. Evaluation of the RIGHT axilla shows at least 8 lymph nodes with abnormal cortical thickening. Largest lymph nodes has a cortical thickness of 7 millimeters. LEFT BREAST: Mammogram: An oval circumscribed mass is identified in the superior LATERAL portion of the LEFT breast. Mass measures 1.6 x 1.3 centimeters. Remainder of the LEFT breast is negative. Mammographic images were processed with CAD. Physical Exam: I palpate enlarged LOWER LEFT axillary lymph node. Ultrasound: Targeted ultrasound is performed, showing numerous enlarged LEFT axillary lymph nodes, largest corresponding to the mammographic finding and measuring 2.3 x 1.5 x 1.8 centimeters. Additionally, 3 other axillary lymph nodes have abnormal morphology with thickened cortex. IMPRESSION: 1. Large ulcerating RIGHT breast mass is highly  likely to represent malignancy. 2. Suspect metastatic lymph nodes to the RIGHT axilla. 3. Suspect LEFT axillary adenopathy. There is no imaging evidence for primary malignancy of the LEFT breast, raising the question of diffuse metastatic disease. 4. At the patient's specific request, results of this study have not been shared with her. Instead, at her request, I spoke with her sister, Shauna Hugh in person, and her son, Arleatha Philipps by telephone in a conference call. RECOMMENDATION: 1. Recommend ultrasound-guided core biopsy of RIGHT breast and RIGHT axilla. If the patient agrees to only a single biopsy, I would recommend core biopsy of the RIGHT breast. 2. Tissue diagnosis of the LEFT axilla may be helpful but could be deferred, pending total-body scans. I have discussed the findings and recommendations with the patient's sister and son. If applicable, a reminder letter will be sent to the patient regarding the next appointment. BI-RADS CATEGORY  5: Highly suggestive of malignancy. Electronically Signed   By: Nolon Nations M.D.   On: 04/24/2020 13:00   US BREAST LTD UNI RIGHT INC AXILLA  Result Date: 04/24/2020 CLINICAL DATA:  RIGHT breast wound for 3-6 months. EXAM: DIGITAL DIAGNOSTIC BILATERAL MAMMOGRAM WITH CAD AND TOMO ULTRASOUND BILATERAL BREAST COMPARISON:  None. ACR Breast Density Category b: There are scattered areas of fibroglandular density. FINDINGS: RIGHT BREAST: Mammogram: RIGHT breast is largely replaced by an ulcerating mass. There is marked skin and trabecular thickening, skin measuring up to 11 millimeters anteriorly. The central portion of the RIGHT breast shows an irregular mass with spiculated margins measuring at least  5.6 x 7.7 x 5.5 centimeters. This mass is contiguous with the thickened, irregular nipple. Large ulcerating cavity identified along the UPPER OUTER QUADRANT. Mammographic images were processed with CAD. Physical Exam: The RIGHT breast is replaced by a large mass. Along the  superior portion of the RIGHT breast, there is a large open wound, measuring at least 10 centimeters in maximum diameter and 2 centimeters in depth. The RIGHT nipple is enlarged, thickened, and woody in appearance. The skin of the entire RIGHT breast is hyper pigmented and firm. I palpate enlarged RIGHT axillary lymph nodes. Ultrasound: Ultrasound of the RIGHT breast is limited by the open wound. Limited ultrasound along the inferior and LATERAL portions are RIGHT breast show marked skin thickening. The mass suspected in the central portion of the RIGHT breast is not well seen sonographically due to marked breast edema. Evaluation of the RIGHT axilla shows at least 8 lymph nodes with abnormal cortical thickening. Largest lymph nodes has a cortical thickness of 7 millimeters. LEFT BREAST: Mammogram: An oval circumscribed mass is identified in the superior LATERAL portion of the LEFT breast. Mass measures 1.6 x 1.3 centimeters. Remainder of the LEFT breast is negative. Mammographic images were processed with CAD. Physical Exam: I palpate enlarged LOWER LEFT axillary lymph node. Ultrasound: Targeted ultrasound is performed, showing numerous enlarged LEFT axillary lymph nodes, largest corresponding to the mammographic finding and measuring 2.3 x 1.5 x 1.8 centimeters. Additionally, 3 other axillary lymph nodes have abnormal morphology with thickened cortex. IMPRESSION: 1. Large ulcerating RIGHT breast mass is highly likely to represent malignancy. 2. Suspect metastatic lymph nodes to the RIGHT axilla. 3. Suspect LEFT axillary adenopathy. There is no imaging evidence for primary malignancy of the LEFT breast, raising the question of diffuse metastatic disease. 4. At the patient's specific request, results of this study have not been shared with her. Instead, at her request, I spoke with her sister, Shauna Hugh in person, and her son, Chrishelle Zito by telephone in a conference call. RECOMMENDATION: 1. Recommend ultrasound-guided  core biopsy of RIGHT breast and RIGHT axilla. If the patient agrees to only a single biopsy, I would recommend core biopsy of the RIGHT breast. 2. Tissue diagnosis of the LEFT axilla may be helpful but could be deferred, pending total-body scans. I have discussed the findings and recommendations with the patient's sister and son. If applicable, a reminder letter will be sent to the patient regarding the next appointment. BI-RADS CATEGORY  5: Highly suggestive of malignancy. Electronically Signed   By: Nolon Nations M.D.   On: 04/24/2020 13:00   MM DIAG BREAST TOMO BILATERAL  Result Date: 04/24/2020 CLINICAL DATA:  RIGHT breast wound for 3-6 months. EXAM: DIGITAL DIAGNOSTIC BILATERAL MAMMOGRAM WITH CAD AND TOMO ULTRASOUND BILATERAL BREAST COMPARISON:  None. ACR Breast Density Category b: There are scattered areas of fibroglandular density. FINDINGS: RIGHT BREAST: Mammogram: RIGHT breast is largely replaced by an ulcerating mass. There is marked skin and trabecular thickening, skin measuring up to 11 millimeters anteriorly. The central portion of the RIGHT breast shows an irregular mass with spiculated margins measuring at least 5.6 x 7.7 x 5.5 centimeters. This mass is contiguous with the thickened, irregular nipple. Large ulcerating cavity identified along the UPPER OUTER QUADRANT. Mammographic images were processed with CAD. Physical Exam: The RIGHT breast is replaced by a large mass. Along the superior portion of the RIGHT breast, there is a large open wound, measuring at least 10 centimeters in maximum diameter and 2 centimeters in depth. The RIGHT  nipple is enlarged, thickened, and woody in appearance. The skin of the entire RIGHT breast is hyper pigmented and firm. I palpate enlarged RIGHT axillary lymph nodes. Ultrasound: Ultrasound of the RIGHT breast is limited by the open wound. Limited ultrasound along the inferior and LATERAL portions are RIGHT breast show marked skin thickening. The mass suspected  in the central portion of the RIGHT breast is not well seen sonographically due to marked breast edema. Evaluation of the RIGHT axilla shows at least 8 lymph nodes with abnormal cortical thickening. Largest lymph nodes has a cortical thickness of 7 millimeters. LEFT BREAST: Mammogram: An oval circumscribed mass is identified in the superior LATERAL portion of the LEFT breast. Mass measures 1.6 x 1.3 centimeters. Remainder of the LEFT breast is negative. Mammographic images were processed with CAD. Physical Exam: I palpate enlarged LOWER LEFT axillary lymph node. Ultrasound: Targeted ultrasound is performed, showing numerous enlarged LEFT axillary lymph nodes, largest corresponding to the mammographic finding and measuring 2.3 x 1.5 x 1.8 centimeters. Additionally, 3 other axillary lymph nodes have abnormal morphology with thickened cortex. IMPRESSION: 1. Large ulcerating RIGHT breast mass is highly likely to represent malignancy. 2. Suspect metastatic lymph nodes to the RIGHT axilla. 3. Suspect LEFT axillary adenopathy. There is no imaging evidence for primary malignancy of the LEFT breast, raising the question of diffuse metastatic disease. 4. At the patient's specific request, results of this study have not been shared with her. Instead, at her request, I spoke with her sister, Shauna Hugh in person, and her son, Nazia Rhines by telephone in a conference call. RECOMMENDATION: 1. Recommend ultrasound-guided core biopsy of RIGHT breast and RIGHT axilla. If the patient agrees to only a single biopsy, I would recommend core biopsy of the RIGHT breast. 2. Tissue diagnosis of the LEFT axilla may be helpful but could be deferred, pending total-body scans. I have discussed the findings and recommendations with the patient's sister and son. If applicable, a reminder letter will be sent to the patient regarding the next appointment. BI-RADS CATEGORY  5: Highly suggestive of malignancy. Electronically Signed   By: Nolon Nations  M.D.   On: 04/24/2020 13:00   MM CLIP PLACEMENT RIGHT  Result Date: 05/02/2020 CLINICAL DATA:  69 year old female presenting for biopsy of a right breast mass and right axillary lymph node. EXAM: DIAGNOSTIC RIGHT MAMMOGRAM POST ULTRASOUND BIOPSY COMPARISON:  Previous exam(s). FINDINGS: Mammographic images were obtained following ultrasound guided biopsy of a right breast mass at 9 o'clock. The Q biopsy marking clip is in expected position at the site of biopsy. Mammographic images were obtained following ultrasound guided biopsy of a right axillary lymph node. The vision biopsy marking clip is in expected position at the site of biopsy. IMPRESSION: Appropriate positioning of the Q shaped biopsy marking clip at the site of biopsy in the right breast at 9 o'clock. Appropriate positioning of the vision shaped biopsy marking clip at the site of biopsy in the right axilla. Final Assessment: Post Procedure Mammograms for Marker Placement Electronically Signed   By: Audie Pinto M.D.   On: 05/02/2020 09:10   [place] RT BREAST BX W LOC DEV 1ST LESION IMG BX SPEC US GUIDE  Addendum Date: 05/07/2020   ADDENDUM REPORT: 05/07/2020 12:23 ADDENDUM: PATHOLOGY revealed: A. BREAST, RIGHT 9:00; ULTRASOUND-GUIDED BIOPSY: - PREDOMINANTLY ADIPOSE TISSUE WITH SCATTERED MAMMARY EPITHELIUM AND RARE FOCUS OF ATYPICAL CELLS. - FINDINGS ARE NOT DIAGNOSTIC OF MALIGNANCY. - DEEPER SECTIONS EXAMINED. B. 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 - EXTRACAPSULAR EXTENSION IS PRESENT. - LARGEST LINEAR FOCUS OF CARCINOMA MEASURES 0.9 CM. Comment: Definitive malignancy is not identified in the biopsy of the right breast 9:00 (specimen A). A limited panel of stains was performed and the metastatic carcinoma in the lymph node (specimen B) is positive for GATA-3 and GCDFP-15, supporting breast origin. While the metastatic carcinoma present in the lymph node  (specimen B) is poorly differentiated and morphologically resembles pleomorphic lobular-type carcinoma in areas definitive sub-classification and grading is deferred to an excision specimen. IHC for ER, PR, and HER2 will be performed on part B and resulted in an addendum. IHC slides were prepared by [facility] Path[facility], [place], [employer]. All controls stained appropriately. Pathology result A is DISCORDANT with imaging findings, per Dr. Audie Pinto. Pathology result B is CONCORDANT with imaging findings, per Dr. Audie Pinto. I spoke with patient, on 05/04/2020, to check on biopsy site. Patient reported biopsy site within normal limits with slight tenderness at the site. Post biopsy care instructions were reviewed and questions were answered. Patient was instructed to call [facility] if any concerns or questions arise related to the biopsy. At family's request, I spoke with patient's daughter Lorayne Bender) by telephone on 05/04/2020 and discussed patient's biopsy results and follow-up plan. Recommendation: Surgical referral. Rebiopsy of the right breast will be deferred at this time pending surgical consultation but may be considered at a future date. Request for surgical referral was relayed to Tanya Nones RN at [facility] by Electa Sniff RN on 05/04/2020. Patient is scheduled with medical oncologist (Dr. Delight Hoh) on 05/08/2020, and surgeon (Dr. Ollen Bowl) on 05/09/2020. Addendum by Electa Sniff RN on 05/07/2020. Electronically Signed   By: Audie Pinto M.D.   On: 05/07/2020 12:23   Result Date: 05/07/2020 CLINICAL DATA:  69 year old female presenting for biopsy of a right breast mass and right axillary lymph node. EXAM: ULTRASOUND GUIDED RIGHT BREAST CORE NEEDLE BIOPSY ULTRASOUND GUIDED RIGHT AXILLARY CORE NEEDLE BIOPSY COMPARISON:  Previous exam(s). PROCEDURE: I met with the patient and we discussed the procedure of ultrasound-guided biopsy,  including benefits and alternatives. We discussed the high likelihood of a successful procedure. We discussed the risks of the procedure, including infection, bleeding, tissue injury, clip migration, and inadequate sampling. Informed written consent was given. The usual time-out protocol was performed immediately prior to the procedure. 1.  Lesion quadrant: Lower outer quadrant Using sterile technique and 1% Lidocaine as local anesthetic, under direct ultrasound visualization, a 12 gauge spring-loaded device was used to perform biopsy of a mass in the right breast at 9 o'clock using a inferior approach. At the conclusion of the procedure a Q tissue marker clip was deployed into the biopsy cavity. Follow up 2 view mammogram was performed and dictated separately. 2. Axilla Using sterile technique and 1% Lidocaine as local anesthetic, under direct ultrasound visualization, a 14 gauge spring-loaded device was used to perform biopsy of a right axillary lymph node using a lateral approach. At the conclusion of the procedure a vision tissue marker clip was deployed into the biopsy cavity. Follow up 2 view mammogram was performed and dictated separately. IMPRESSION: Ultrasound guided biopsy of a right breast mass at 9 o'clock and of a right axillary lymph node. No apparent complications. Electronically Signed: By: Audie Pinto M.D. On: 05/02/2020 09:12   [place] RT BREAST BX W LOC DEV EA ADD LESION IMG BX SPEC US GUIDE  Addendum Date:  05/07/2020   ADDENDUM REPORT: 05/07/2020 12:23 ADDENDUM: PATHOLOGY revealed: A. BREAST, RIGHT 9:00; ULTRASOUND-GUIDED BIOPSY: - PREDOMINANTLY ADIPOSE TISSUE WITH SCATTERED MAMMARY EPITHELIUM AND RARE FOCUS OF ATYPICAL CELLS. - FINDINGS ARE NOT DIAGNOSTIC OF MALIGNANCY. - DEEPER SECTIONS EXAMINED. B. LYMPH NODE, RIGHT AXILLA; ULTRASOUND-GUIDED BIOPSY: - METASTATIC CARCINOMA CONSISTENT WITH BREAST ORIGIN INVOLVING LYMPH NODE. - EXTRACAPSULAR EXTENSION IS PRESENT. - LARGEST LINEAR FOCUS OF  CARCINOMA MEASURES 0.9 CM. Comment: Definitive malignancy is not identified in the biopsy of the right breast 9:00 (specimen A). A limited panel of stains was performed and the metastatic carcinoma in the lymph node (specimen B) is positive for GATA-3 and GCDFP-15, supporting breast origin. While the metastatic carcinoma present in the lymph node (specimen B) is poorly differentiated and morphologically resembles pleomorphic lobular-type carcinoma in areas definitive sub-classification and grading is deferred to an excision specimen. IHC for ER, PR, and HER2 will be performed on part B and resulted in an addendum. IHC slides were prepared by [facility] Path[facility], [facility], [place]. All controls stained appropriately. Pathology result A is DISCORDANT with imaging findings, per Dr. Audie Pinto. Pathology result B is CONCORDANT with imaging findings, per Dr. Audie Pinto. I spoke with patient, on 05/04/2020, to check on biopsy site. Patient reported biopsy site within normal limits with slight tenderness at the site. Post biopsy care instructions were reviewed and questions were answered. Patient was instructed to call [facility] if any concerns or questions arise related to the biopsy. At family's request, I spoke with patient's daughter Lorayne Bender) by telephone on 05/04/2020 and discussed patient's biopsy results and follow-up plan. Recommendation: Surgical referral. Rebiopsy of the right breast will be deferred at this time pending surgical consultation but may be considered at a future date. Request for surgical referral was relayed to Tanya Nones RN at [facility] by Electa Sniff RN on 05/04/2020. Patient is scheduled with medical oncologist (Dr. Delight Hoh) on 05/08/2020, and surgeon (Dr. Ollen Bowl) on 05/09/2020. Addendum by Electa Sniff RN on 05/07/2020. Electronically Signed   By: Audie Pinto M.D.   On: 05/07/2020 12:23   Result Date:  05/07/2020 CLINICAL DATA:  69 year old female presenting for biopsy of a right breast mass and right axillary lymph node. EXAM: ULTRASOUND GUIDED RIGHT BREAST CORE NEEDLE BIOPSY ULTRASOUND GUIDED RIGHT AXILLARY CORE NEEDLE BIOPSY COMPARISON:  Previous exam(s). PROCEDURE: I met with the patient and we discussed the procedure of ultrasound-guided biopsy, including benefits and alternatives. We discussed the high likelihood of a successful procedure. We discussed the risks of the procedure, including infection, bleeding, tissue injury, clip migration, and inadequate sampling. Informed written consent was given. The usual time-out protocol was performed immediately prior to the procedure. 1.  Lesion quadrant: Lower outer quadrant Using sterile technique and 1% Lidocaine as local anesthetic, under direct ultrasound visualization, a 12 gauge spring-loaded device was used to perform biopsy of a mass in the right breast at 9 o'clock using a inferior approach. At the conclusion of the procedure a Q tissue marker clip was deployed into the biopsy cavity. Follow up 2 view mammogram was performed and dictated separately. 2. Axilla Using sterile technique and 1% Lidocaine as local anesthetic, under direct ultrasound visualization, a 14 gauge spring-loaded device was used to perform biopsy of a right axillary lymph node using a lateral approach. At the conclusion of the procedure a vision tissue marker clip was deployed into the biopsy cavity. Follow up 2 view mammogram was performed and dictated separately.  IMPRESSION: Ultrasound guided biopsy of a right breast mass at 9 o'clock and of a right axillary lymph node. No apparent complications. Electronically Signed: By: Audie Pinto M.D. On: 05/02/2020 09:12    ASSESSMENT: Locally advanced right breast cancer.  PLAN:    1. Locally advanced right breast cancer: Imaging and biopsy results reviewed independently and discussed with pathology.  Concern for metastatic disease  given left axillary lymphadenopathy without definitive left breast mass.  Will get PET scan in the next week to complete the staging process.  Patient also has surgical consultation later this week.  Given the extent of disease, unclear if surgery is an option, but patient will require port placement in the future if chemotherapy is desired.  Return to clinic in 1 week after PET scan to discuss the final staging and pathology results and as well as treatment planning.  I spent a total of 60 minutes reviewing chart data, face-to-face evaluation with the patient, counseling and coordination of care as detailed above.   Patient expressed understanding and was in agreement with this plan. She also understands that She can call clinic at any time with any questions, concerns, or complaints.   Cancer Staging Breast cancer, right [facility]) Staging form: Breast, AJCC 8th Edition - Clinical: Stage IV (cT4b, cN2, cM1, ER+, PR+, HER2: Equivocal) - Signed by Lloyd Huger, MD on 05/08/2020   Lloyd Huger, MD   05/08/2020 5:09 PM

## 2020-05-08 NOTE — Progress Notes (Signed)
Met patient and her son [name] today during her initial medical oncology consult with Dr. Grayland Ormond.  Molecular studies pending.  At this time, plan is for PET scan and possible neoadjuvant chemotherapy.  Gave patient breast cancer educational literature, "My Breast Cancer Treatment Handbook" by Josephine Igo, RN.  Patient and her son were encouraged to call with any questions or needs.

## 2020-05-10 ENCOUNTER — Other Ambulatory Visit: Payer: Self-pay | Admitting: General Surgery

## 2020-05-11 NOTE — Progress Notes (Signed)
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 Electronically Signed   By: Audie Pinto M.D.   On: 05/07/2020 12:23   Result Date: 05/07/2020 CLINICAL DATA:  69 year old female presenting for biopsy of a right breast mass and right axillary lymph node. EXAM: ULTRASOUND GUIDED RIGHT BREAST CORE NEEDLE BIOPSY ULTRASOUND GUIDED RIGHT AXILLARY CORE NEEDLE BIOPSY COMPARISON:  Previous exam(s). PROCEDURE: I met with the patient and we discussed the procedure of ultrasound-guided biopsy, including benefits and alternatives. We discussed the high likelihood of a successful procedure. We discussed the risks of the procedure, including infection, bleeding, tissue injury, clip migration, and inadequate sampling. Informed written consent was given. The usual time-out protocol was performed immediately prior to the procedure. 1.  Lesion quadrant: Lower outer quadrant Using sterile technique and 1% Lidocaine as local anesthetic, under direct ultrasound visualization, a 12 gauge spring-loaded device was used to perform biopsy of a mass in the right breast at 9 o'clock using a inferior approach. At the conclusion of the  procedure a Q tissue marker clip was deployed into the biopsy cavity. Follow up 2 view mammogram was performed and dictated separately. 2. Axilla Using sterile technique and 1% Lidocaine as local anesthetic, under direct ultrasound visualization, a 14 gauge spring-loaded device was used to perform biopsy of a right axillary lymph node using a lateral approach. At the conclusion of the procedure a vision tissue marker clip was deployed into the biopsy cavity. Follow up 2 view mammogram was performed and dictated separately. IMPRESSION: Ultrasound guided biopsy of a right breast mass at 9 o'clock and of a right axillary lymph node. No apparent complications. Electronically Signed: By: Audie Pinto M.D. 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  PLAN:    1. 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  Patient will undergo total mastectomy on May 21, 2020.  She was given a prescription of letrozole today as initial treatment.  Patient will also require Zometa in the future for her bony metastasis.  We also discussed the option of adding in chemotherapy with oral Ibrance or possibly IV treatment, but patient  wishes to stick with letrozole only at this time.  Return to clinic 2 to 3 weeks after surgery for further evaluation and initiation of Zometa.   I spent a total of 30 minutes reviewing chart data, face-to-face evaluation with the patient, counseling and coordination of care as detailed above.   Patient expressed understanding and was in agreement with this plan. She also understands that  She can call clinic at any time with any questions, concerns, or complaints.   Cancer Staging Breast cancer, right [facility]) Staging form: Breast, AJCC 8th Edition - Clinical: Stage IV (cT4b, cN2, cM1, ER+, PR+, HER2-) - Signed by Lloyd Huger, MD on 05/17/2020   Lloyd Huger, MD   05/17/2020 2:58 PM

## 2020-05-14 ENCOUNTER — Encounter: Payer: Self-pay | Admitting: *Deleted

## 2020-05-14 ENCOUNTER — Other Ambulatory Visit: Payer: Self-pay | Admitting: *Deleted

## 2020-05-14 MED ORDER — ALPRAZOLAM 0.25 MG PO TABS: 0.2500 mg | ORAL_TABLET | Freq: Two times a day (BID) | ORAL | 0 refills | Status: DC | PRN

## 2020-05-14 MED ORDER — ALPRAZOLAM 0.25 MG PO TABS
0.2500 mg | ORAL_TABLET | Freq: Two times a day (BID) | ORAL | 0 refills | Status: DC | PRN
Start: 2020-05-14 — End: 2020-05-14

## 2020-05-14 NOTE — Progress Notes (Signed)
Patients son [name] called and states patient is very anxious about her upcoming PET scan.  Wants to know if Dr. Grayland Ormond could prescribe her an antianxiety.  Discussed with Dr. Grayland Ormond.  Order for Xanax 0.25 mg by mouth BID ordered.  Patient's son informed.

## 2020-05-15 ENCOUNTER — Encounter: Payer: Self-pay | Admitting: Oncology

## 2020-05-15 LAB — SURGICAL PATHOLOGY

## 2020-05-16 ENCOUNTER — Encounter
Admission: RE | Admit: 2020-05-16 | Discharge: 2020-05-16 | Disposition: A | Discharge status: Home / Self Care | Payer: Medicare PPO | Source: Ambulatory Visit | Attending: General Surgery | Admitting: General Surgery

## 2020-05-16 ENCOUNTER — Ambulatory Visit
Admission: RE | Admit: 2020-05-16 | Discharge: 2020-05-16 | Disposition: A | Discharge status: Home / Self Care | Payer: Medicare PPO | Source: Ambulatory Visit | Attending: Oncology | Admitting: Oncology

## 2020-05-16 ENCOUNTER — Other Ambulatory Visit: Payer: Self-pay

## 2020-05-16 DIAGNOSIS — C7951 Secondary malignant neoplasm of bone: Secondary | ICD-10-CM | POA: Insufficient documentation

## 2020-05-16 DIAGNOSIS — I700 Atherosclerosis of aorta: Secondary | ICD-10-CM | POA: Diagnosis not present

## 2020-05-16 DIAGNOSIS — C773 Secondary and unspecified malignant neoplasm of axilla and upper limb lymph nodes: Secondary | ICD-10-CM | POA: Diagnosis not present

## 2020-05-16 DIAGNOSIS — C50911 Malignant neoplasm of unspecified site of right female breast: Secondary | ICD-10-CM | POA: Insufficient documentation

## 2020-05-16 DIAGNOSIS — J439 Emphysema, unspecified: Secondary | ICD-10-CM | POA: Insufficient documentation

## 2020-05-16 HISTORY — DX: Anxiety disorder, unspecified: F41.9

## 2020-05-16 LAB — GLUCOSE, CAPILLARY: Glucose-Capillary: 107 mg/dL — ABNORMAL HIGH (ref 70–99)

## 2020-05-16 MED ORDER — FLUDEOXYGLUCOSE F - 18 (FDG) INJECTION
8.7000 | Freq: Once | INTRAVENOUS | Status: AC | PRN
  Administered 2020-05-16: 8.92 via INTRAVENOUS

## 2020-05-16 NOTE — Patient Instructions (Signed)
Your procedure is scheduled on:  Wednesday 05/23/20.  Report to DAY SURGERY DEPARTMENT LOCATED ON 2ND FLOOR MEDICAL MALL ENTRANCE. To find out your arrival time please call [phone] between 1PM - 3PM on Tuesday 05/22/20.   Remember: Instructions that are not followed completely may result in serious medical risk, up to and including death, or upon the discretion of your surgeon and anesthesiologist your surgery may need to be rescheduled.     __X__ 1. Do not eat food after midnight the night before your procedure.                 No gum chewing or hard candies. You may drink clear liquids up to 2 hours                 before you are scheduled to arrive for your surgery- DO NOT drink clear                 liquids within 2 hours of the start of your surgery.                 Clear Liquids include:  water, apple juice without pulp, clear carbohydrate                 drink such as Clearfast or Gatorade, Black Coffee or Tea (Do not add                 milk or creamer to coffee or tea).  __X__2.  On the morning of surgery brush your teeth with toothpaste and water, you may rinse your mouth with mouthwash if you wish.  Do not swallow any toothpaste or mouthwash.    __X__ 3.  No Alcohol for 24 hours before or after surgery.  __X__ 4.  Do Not Smoke or use e-cigarettes For 24 Hours Prior to Your Surgery.                 Do not use any chewable tobacco products for at least 6 hours prior to                 surgery.  __X__5.  Notify your doctor if there is any change in your medical condition      (cold, fever, infections).      Do NOT wear jewelry, make-up, hairpins, clips or nail polish. Do NOT wear lotions, powders, or perfumes.  Do NOT shave 48 hours prior to surgery. Men may shave face and neck. Do NOT bring valuables to the hospital.     [facility] is not responsible for any belongings or valuables.   Contacts, dentures/partials or body piercings may not be worn into surgery.  Bring a case for your contacts, glasses or hearing aids, a denture cup will be supplied.   For patients admitted to the hospital, discharge time is determined by your treatment team.    Patients discharged the day of surgery will not be allowed to drive home.     __X__ Take these medicines the morning of surgery with A SIP OF WATER:     1. ALPRAZolam Duanne Moron) if needed    __X__ Use CHG Soap as directed  __X__ Stop Anti-inflammatories 7 days before surgery such as Advil, Ibuprofen, Motrin, BC or Goodies Powder, Naprosyn, Naproxen, Aleve, Aspirin, Meloxicam. May take Tylenol if needed for pain or discomfort.   __X__Do not start taking any new herbal supplements or vitamins prior to your procedure.    Wear comfortable clothing (specific  to your surgery type) to the hospital.  Plan for stool softeners for home use; pain medications have a tendency to cause constipation. You can also help prevent constipation by eating foods high in fiber such as fruits and vegetables and drinking plenty of fluids as your diet allows.  After surgery, you can prevent lung complications by doing breathing exercises.Take deep breaths and cough every 1-2 hours. Your doctor may order a device called an Incentive Spirometer to help you take deep breaths.  Please call the [place] Department at [phone] if you have any questions about these instructions.

## 2020-05-17 ENCOUNTER — Other Ambulatory Visit: Payer: Self-pay

## 2020-05-17 ENCOUNTER — Inpatient Hospital Stay (HOSPITAL_BASED_OUTPATIENT_CLINIC_OR_DEPARTMENT_OTHER): Payer: Medicare PPO | Admitting: Oncology

## 2020-05-17 ENCOUNTER — Encounter: Payer: Self-pay | Admitting: Urgent Care

## 2020-05-17 ENCOUNTER — Encounter
Admission: RE | Admit: 2020-05-17 | Discharge: 2020-05-17 | Disposition: A | Discharge status: Home / Self Care | Payer: Medicare PPO | Source: Ambulatory Visit | Attending: General Surgery | Admitting: General Surgery

## 2020-05-17 VITALS — BP 127/70 | HR 92 | Temp 96.8°F | Resp 17 | Wt 168.3 lb

## 2020-05-17 DIAGNOSIS — C50911 Malignant neoplasm of unspecified site of right female breast: Secondary | ICD-10-CM

## 2020-05-17 DIAGNOSIS — R9431 Abnormal electrocardiogram [ECG] [EKG]: Secondary | ICD-10-CM | POA: Insufficient documentation

## 2020-05-17 DIAGNOSIS — Z01810 Encounter for preprocedural cardiovascular examination: Secondary | ICD-10-CM | POA: Insufficient documentation

## 2020-05-17 DIAGNOSIS — Z7189 Other specified counseling: Secondary | ICD-10-CM

## 2020-05-17 DIAGNOSIS — C50511 Malignant neoplasm of lower-outer quadrant of right female breast: Secondary | ICD-10-CM | POA: Diagnosis not present

## 2020-05-17 DIAGNOSIS — Z170 Estrogen receptor positive status [ER+]: Secondary | ICD-10-CM | POA: Diagnosis not present

## 2020-05-17 HISTORY — DX: Abnormal electrocardiogram (ECG) (EKG): R94.31

## 2020-05-17 MED ORDER — LETROZOLE 2.5 MG PO TABS: 2.5000 mg | ORAL_TABLET | Freq: Every day | ORAL | 3 refills | Status: DC

## 2020-05-17 NOTE — Progress Notes (Signed)
Here today for PET results. L hip and groin discomfort continues. The pain is not bad but ROM is difficult in that L hip.

## 2020-05-18 ENCOUNTER — Encounter: Payer: Self-pay | Admitting: General Surgery

## 2020-05-18 ENCOUNTER — Other Ambulatory Visit: Payer: Self-pay | Admitting: Cardiology

## 2020-05-18 DIAGNOSIS — I340 Nonrheumatic mitral (valve) insufficiency: Secondary | ICD-10-CM | POA: Insufficient documentation

## 2020-05-18 DIAGNOSIS — Z01818 Encounter for other preprocedural examination: Secondary | ICD-10-CM

## 2020-05-18 DIAGNOSIS — R9431 Abnormal electrocardiogram [ECG] [EKG]: Secondary | ICD-10-CM

## 2020-05-18 NOTE — Progress Notes (Signed)
[facility] Perioperative Services  Pre-Admission/Anesthesia Testing Clinical Review  Date: 05/18/20  Patient Demographics:  Name: Kristy York DOB:   Oct 28, 1950 MRN:   [id number]  Planned Surgical Procedure(s):    Case: [id number] Date/Time: 05/23/20 1145   Procedures:      MASTECTOMY MODIFIED RADICAL (Right )     INSERTION PORT-A-CATH (Left )     AXILLARY LYMPH NODE BIOPSY (Left )   Anesthesia type: General   Pre-op diagnosis: right breast cancer, left axillary adenopathy   Location: [facility] OR ROOM 09 / [place] ORS FOR ANESTHESIA GROUP   Surgeons: Robert Bellow, MD     NOTE: Available PAT nursing documentation and vital signs have been reviewed. Clinical nursing staff has updated patient's PMH/PSHx, current medication list, and drug allergies/intolerances to ensure comprehensive history available to assist in medical decision making as it pertains to the aforementioned surgical procedure and anticipated anesthetic course.   Clinical Discussion:  Kristy York is a 69 y.o. female who is submitted for pre-surgical anesthesia review and clearance prior to her undergoing the above procedure. Patient has never been a smoker. Pertinent PMH includes: abnormal EKG (new on 05/17/20), T2DM, HLD, OA, metastatic breast cancer (osseous metastasis), anxiety (on BZO).   Until recently, patient has not been seen for routine health care purposes "in decades". Patient established care with internal medicine at [facility] Netty Starring, MD) in June of this year at which time she was diagnosed with T2DM (diet controlled) and HLD. Patient was seen for concerns of a several month history of a non-healing wound to her RIGHT breast. Wound culture grew out  Morganella morganii, and patient was subsequently treated with a 10 day course of levofloxacin. Despite antibiotics, patient did not improve.  Mammogram done that revealed a large breast mass with associated RIGHT axillary LAD. Biopsy  obtained that resulted as possible for invasive carcinoma; ER/PR (+) and HER2/neu (-).    Patient referred to oncology for staging and treatment. She was seen by Dr. Delight Hoh on 05/09/2019. PET scan on 05/16/2020 showed intensely hypermetabolic 6.5 x 4.6 cm mass in the RIGHT breast. There was evidence of metastasis to the RIGHT axilla and lumbar spine, sacrum, and BILATERAL pelvic girdle. Patient diagnosed with metastatic stage IV invasive carcinoma of the RIGHT breast.    She was referred to general surgery for consideration of mastectomy. Seen in consult on 05/09/2020 by Dr. Hervey Ard; notes reviewed. Decision was made to pursue a modified RIGHT radical mastectomy.   Patient came in on 05/17/2020 for her pre-admission testing. At that time, 12 lead ECG was performed that revealed marked TWI. Patient denied associated symptoms; no chest pain, shortness of breath, nausea, diaphoresis, or vertiginous symptoms. She was very anxious during her appointment despite pre-visit anxiolytic dose (alprazolam). Patient denied any significant medical history beyond the aforementioned HLD and T2DM. She has never had any known cardiac issues. Given her age and significant EKG changes, I took the liberty of referring the patient to cardiology for further evaluation. We discussed the potential implications of the noted ECG changes. Patient became even more anxious. [facility] graciously able to work patient in on 05/18/2020 at 0930; patient amenable to seeing cardiology.  She was advised prior to leaving clinic that if she developed any symptoms prior to seeing cardiology she should proceed directly to the emergency department at [facility] for evaluation and treatment; verbalized understanding and consent.   Patient seen in consult by cardiology Margarito Courser, PA-C) on 05/18/2020; notes reviewed. ECG repeated  that was similar to the one obtained in PAT. Concern for HOCM. Patient was asymptomatic. She denied angina  or anginal equivalent symptoms; no shortness of breath, orthopnea, PND, palpitations, vertiginous symptoms, presyncope/syncope. Her only complaints in clinic were related to anxiety regarding her newly diagnosed malignancy and the plans for surgical treatment and adjuvant chemotherapy. Patient reported intermittent peripheral (pedal) edema. Exam revealed a grade 2/6 systolic cardiac murmur. Given her EKG changes, the decision was made to obtain a STAT echocardiogram while patient was in clinic. TTE performed revealing an LVEF of > 55% (see full interpretation below). Following review of the EKG and TTE, Dr. Saralyn Pilar issued clearance to proceed with surgery with a LOW risk stratification. This patient is not on daily anticoagulation therapy.   She denies previous intra-operative complications with anesthesia.    Vitals with BMI 05/17/2020 05/16/2020 05/08/2020  Height - '5\' 2"'  -  Weight 168 lbs 5 oz - 167 lbs 11 oz  BMI 25.95 - -  Systolic 638 - 756  Diastolic 70 - 73  Pulse 92 - 79    Providers/Specialists:   NOTE: Primary physician provider listed below. Patient may have been seen by APP or partner within same practice.   PROVIDER ROLE LAST Desma Mcgregor, MD General Surgery 05/09/2020  Dion Body, MD Primary Care Provider 04/16/2020  Isaias Cowman, MD Cardiology 05/18/2020  Delight Hoh, MD Oncology 05/17/2020   Allergies:  Nickel  Current Home Medications:   No current facility-administered medications for this encounter.   Marland Kitchen ALPRAZolam (XANAX) 0.25 MG tablet  . letrozole (FEMARA) 2.5 MG tablet   History:   Past Medical History:  Diagnosis Date  . Anxiety   . Arthritis   . Diet-controlled diabetes mellitus ([place])   . Fatigue   . HLD (hyperlipidemia)   . Invasive carcinoma of breast ([place]) 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; ER/PR (+), HER2/neu (-)  . Metastasis to bone [facility])    Breast primary  . Murmur, cardiac   . Peripheral edema   . T wave inversion on  electrocardiogram 05/17/2020   leads I, II, III, aVF, V3-V6   Past Surgical History:  Procedure Laterality Date  . BREAST BIOPSY Right 05/02/2020   [place] Bx, Q clip, path pending  . BREAST BIOPSY Right 05/02/2020   [place] Axilla Bx, path pending  . CHOLECYSTECTOMY    . COLONOSCOPY    . LAPAROSCOPIC ABDOMINAL EXPLORATION     Family History  Problem Relation Age of Onset  . Breast cancer Sister    Social History   Tobacco Use  . Smoking status: Never Smoker  . Smokeless tobacco: Never Used  Vaping Use  . Vaping Use: Never used  Substance Use Topics  . Alcohol use: Never  . Drug use: Never    Pertinent Clinical Results:  LABS: Labs reviewed: Acceptable for surgery.        ECG: Date: 05/17/2020 Time ECG obtained: 1208 PM Rate: 79 bpm Rhythm: normal sinus Axis (leads I and aVF): Extreme Intervals: PR 154 ms. QTc 472 ms. ST segment and T wave changes: Marked TWI in leads I, II, III, aVF, V3-V6 Comparison: No previous tracings available for review and comparison.    IMAGING / PROCEDURES: ECHOCARDIOGRAM done on 05/18/2020 1. LVEF > 55% 2. Normal LV systolic function 3. Normal RV systolic function 4. Mild MR 5. Trivial TR, PR 6. No valvular stenosis 7. Mild to moderate LVH  PET SCAN done on 05/16/2020 1. Intensely hypermetabolic infiltrative 6.5 x 4.6 cm  right breast mass with asymmetric diffuse hypermetabolic right breast skin thickening, compatible with primary right breast malignancy. 2. Hypermetabolic bilateral axillary lymph node metastases. 3. Multifocal sclerotic hypermetabolic bone metastases in the lumbar spine, sacrum and bilateral pelvic girdle. 4. Aortic atherosclerosis and emphysema   Impression and Plan:  Kristy York has been referred for pre-anesthesia review and clearance prior her undergoing the planned anesthetic and procedural courses. Available labs, pertinent testing, and imaging results were personally reviewed by me. This patient has been  appropriately cleared by cardiology.   Based on clinical review performed today (05/18/20), barring any significant acute changes in the patient's overall condition, it is anticipated that she will be able to proceed with the planned surgical intervention. Any acute changes in clinical condition may necessitate her procedure being postponed and/or cancelled. Pre-surgical instructions were reviewed with the patient during her PAT appointment and questions were fielded by PAT clinical staff.  Honor Loh, MSN, APRN, FNP-C, CEN [facility]  Peri-operative Services Nurse Practitioner Phone: [phone] 05/18/20 4:18 PM  NOTE: This note has been prepared using Dragon dictation software. Despite my best ability to proofread, there is always the potential that unintentional transcriptional errors may still occur from this process.

## 2020-05-21 ENCOUNTER — Other Ambulatory Visit
Admission: RE | Admit: 2020-05-21 | Discharge: 2020-05-21 | Disposition: A | Discharge status: Home / Self Care | Payer: Medicare PPO | Source: Ambulatory Visit | Attending: General Surgery | Admitting: General Surgery

## 2020-05-21 ENCOUNTER — Other Ambulatory Visit: Payer: Self-pay

## 2020-05-21 DIAGNOSIS — Z20822 Contact with and (suspected) exposure to covid-19: Secondary | ICD-10-CM | POA: Diagnosis not present

## 2020-05-21 DIAGNOSIS — Z01812 Encounter for preprocedural laboratory examination: Secondary | ICD-10-CM | POA: Diagnosis present

## 2020-05-21 LAB — SARS CORONAVIRUS 2 (TAT 6-24 HRS): SARS Coronavirus 2: NEGATIVE

## 2020-05-22 ENCOUNTER — Ambulatory Visit: Payer: Medicare PPO

## 2020-05-23 ENCOUNTER — Ambulatory Visit: Payer: Medicare PPO

## 2020-05-23 ENCOUNTER — Ambulatory Visit: Payer: Medicare PPO | Admitting: Urgent Care

## 2020-05-23 ENCOUNTER — Ambulatory Visit
Admission: RE | Admit: 2020-05-23 | Discharge: 2020-05-23 | Disposition: A | Discharge status: Home / Self Care | Payer: Medicare PPO | Attending: General Surgery | Admitting: General Surgery

## 2020-05-23 ENCOUNTER — Encounter
Admission: RE | Disposition: A | Discharge status: Home / Self Care | Payer: Self-pay | Source: Home / Self Care | Attending: General Surgery

## 2020-05-23 ENCOUNTER — Encounter: Payer: Self-pay | Admitting: General Surgery

## 2020-05-23 ENCOUNTER — Other Ambulatory Visit: Payer: Self-pay

## 2020-05-23 DIAGNOSIS — F419 Anxiety disorder, unspecified: Secondary | ICD-10-CM | POA: Insufficient documentation

## 2020-05-23 DIAGNOSIS — Z79811 Long term (current) use of aromatase inhibitors: Secondary | ICD-10-CM | POA: Insufficient documentation

## 2020-05-23 DIAGNOSIS — Z170 Estrogen receptor positive status [ER+]: Secondary | ICD-10-CM | POA: Diagnosis not present

## 2020-05-23 DIAGNOSIS — Z95828 Presence of other vascular implants and grafts: Secondary | ICD-10-CM

## 2020-05-23 DIAGNOSIS — Z452 Encounter for adjustment and management of vascular access device: Secondary | ICD-10-CM | POA: Diagnosis not present

## 2020-05-23 DIAGNOSIS — M1990 Unspecified osteoarthritis, unspecified site: Secondary | ICD-10-CM | POA: Diagnosis not present

## 2020-05-23 DIAGNOSIS — C773 Secondary and unspecified malignant neoplasm of axilla and upper limb lymph nodes: Secondary | ICD-10-CM | POA: Diagnosis not present

## 2020-05-23 DIAGNOSIS — C7951 Secondary malignant neoplasm of bone: Secondary | ICD-10-CM | POA: Diagnosis not present

## 2020-05-23 DIAGNOSIS — Z79899 Other long term (current) drug therapy: Secondary | ICD-10-CM | POA: Insufficient documentation

## 2020-05-23 DIAGNOSIS — R7303 Prediabetes: Secondary | ICD-10-CM | POA: Diagnosis not present

## 2020-05-23 DIAGNOSIS — C50911 Malignant neoplasm of unspecified site of right female breast: Secondary | ICD-10-CM | POA: Diagnosis not present

## 2020-05-23 HISTORY — PX: PORTACATH PLACEMENT: SHX2246

## 2020-05-23 HISTORY — PX: MASTECTOMY MODIFIED RADICAL: SHX5962

## 2020-05-23 HISTORY — DX: Type 2 diabetes mellitus without complications: E11.9

## 2020-05-23 HISTORY — DX: Edema, unspecified: R60.9

## 2020-05-23 HISTORY — DX: Hyperlipidemia, unspecified: E78.5

## 2020-05-23 HISTORY — DX: Unspecified osteoarthritis, unspecified site: M19.90

## 2020-05-23 HISTORY — DX: Localized edema: R60.0

## 2020-05-23 HISTORY — DX: Other fatigue: R53.83

## 2020-05-23 HISTORY — DX: Secondary malignant neoplasm of bone: C79.51

## 2020-05-23 HISTORY — DX: Cardiac murmur, unspecified: R01.1

## 2020-05-23 LAB — GLUCOSE, CAPILLARY: Glucose-Capillary: 128 mg/dL — ABNORMAL HIGH (ref 70–99)

## 2020-05-23 SURGERY — MASTECTOMY, MODIFIED RADICAL
Anesthesia: General | Laterality: Right

## 2020-05-23 MED ORDER — GLYCOPYRROLATE 0.2 MG/ML IJ SOLN
INTRAMUSCULAR | Status: AC
  Filled 2020-05-23: qty 1

## 2020-05-23 MED ORDER — MIDAZOLAM HCL 2 MG/2ML IJ SOLN
INTRAMUSCULAR | Status: DC | PRN
  Administered 2020-05-23: 2 mg via INTRAVENOUS

## 2020-05-23 MED ORDER — BUPIVACAINE-EPINEPHRINE 0.5% -1:200000 IJ SOLN
INTRAMUSCULAR | Status: DC | PRN
  Administered 2020-05-23: 10 mL

## 2020-05-23 MED ORDER — PHENYLEPHRINE HCL (PRESSORS) 10 MG/ML IV SOLN
INTRAVENOUS | Status: DC | PRN
  Administered 2020-05-23 (×3): 100 ug via INTRAVENOUS

## 2020-05-23 MED ORDER — PROPOFOL 500 MG/50ML IV EMUL
INTRAVENOUS | Status: DC | PRN
  Administered 2020-05-23: 15 ug/kg/min via INTRAVENOUS

## 2020-05-23 MED ORDER — FAMOTIDINE 20 MG PO TABS: 20.0000 mg | ORAL_TABLET | Freq: Once | ORAL | Status: AC

## 2020-05-23 MED ORDER — HYDROCODONE-ACETAMINOPHEN 5-325 MG PO TABS
1.0000 | ORAL_TABLET | ORAL | 0 refills | Status: DC | PRN
Start: 2020-05-23 — End: 2020-09-11

## 2020-05-23 MED ORDER — ONDANSETRON HCL 4 MG/2ML IJ SOLN: 4.0000 mg | Freq: Once | INTRAMUSCULAR | Status: DC | PRN

## 2020-05-23 MED ORDER — KETOROLAC TROMETHAMINE 30 MG/ML IJ SOLN
INTRAMUSCULAR | Status: DC | PRN
  Administered 2020-05-23: 30 mg via INTRAVENOUS

## 2020-05-23 MED ORDER — CEFAZOLIN SODIUM-DEXTROSE 2-4 GM/100ML-% IV SOLN
2.0000 g | INTRAVENOUS | Status: AC
  Administered 2020-05-23: 2 g via INTRAVENOUS

## 2020-05-23 MED ORDER — EPHEDRINE 5 MG/ML INJ
INTRAVENOUS | Status: AC
  Filled 2020-05-23: qty 10

## 2020-05-23 MED ORDER — FAMOTIDINE 20 MG PO TABS
ORAL_TABLET | ORAL | Status: AC
  Administered 2020-05-23: 20 mg via ORAL
  Filled 2020-05-23: qty 1

## 2020-05-23 MED ORDER — BUPIVACAINE-EPINEPHRINE (PF) 0.5% -1:200000 IJ SOLN
INTRAMUSCULAR | Status: AC
  Filled 2020-05-23: qty 30

## 2020-05-23 MED ORDER — KETOROLAC TROMETHAMINE 30 MG/ML IJ SOLN
INTRAMUSCULAR | Status: AC
  Filled 2020-05-23: qty 1

## 2020-05-23 MED ORDER — SODIUM CHLORIDE 0.9 % IV SOLN
INTRAVENOUS | Status: DC | PRN
  Administered 2020-05-23: 30 ug/min via INTRAVENOUS

## 2020-05-23 MED ORDER — FENTANYL CITRATE (PF) 100 MCG/2ML IJ SOLN: 25.0000 ug | INTRAMUSCULAR | Status: DC | PRN

## 2020-05-23 MED ORDER — FENTANYL CITRATE (PF) 100 MCG/2ML IJ SOLN
INTRAMUSCULAR | Status: AC
  Filled 2020-05-23: qty 2

## 2020-05-23 MED ORDER — SODIUM CHLORIDE (PF) 0.9 % IJ SOLN
INTRAMUSCULAR | Status: AC
  Filled 2020-05-23: qty 40

## 2020-05-23 MED ORDER — LIDOCAINE HCL (PF) 1 % IJ SOLN
INTRAMUSCULAR | Status: AC
  Filled 2020-05-23: qty 30

## 2020-05-23 MED ORDER — EPHEDRINE SULFATE 50 MG/ML IJ SOLN
INTRAMUSCULAR | Status: DC | PRN
  Administered 2020-05-23: 5 mg via INTRAVENOUS

## 2020-05-23 MED ORDER — PROPOFOL 10 MG/ML IV BOLUS
INTRAVENOUS | Status: AC
  Filled 2020-05-23: qty 40

## 2020-05-23 MED ORDER — DEXAMETHASONE SODIUM PHOSPHATE 10 MG/ML IJ SOLN
INTRAMUSCULAR | Status: DC | PRN
  Administered 2020-05-23: 8 mg via INTRAVENOUS

## 2020-05-23 MED ORDER — METOPROLOL SUCCINATE ER 25 MG PO TB24
25.0000 mg | ORAL_TABLET | Freq: Every day | ORAL | Status: DC
  Administered 2020-05-23: 25 mg via ORAL
  Filled 2020-05-23: qty 1

## 2020-05-23 MED ORDER — PROPOFOL 10 MG/ML IV BOLUS
INTRAVENOUS | Status: DC | PRN
  Administered 2020-05-23: 150 mg via INTRAVENOUS

## 2020-05-23 MED ORDER — REMIFENTANIL HCL 1 MG IV SOLR
INTRAVENOUS | Status: AC
  Filled 2020-05-23: qty 1000

## 2020-05-23 MED ORDER — SUCCINYLCHOLINE CHLORIDE 200 MG/10ML IV SOSY
PREFILLED_SYRINGE | INTRAVENOUS | Status: AC
  Filled 2020-05-23: qty 10

## 2020-05-23 MED ORDER — ONDANSETRON HCL 4 MG/2ML IJ SOLN
INTRAMUSCULAR | Status: AC
  Filled 2020-05-23: qty 2

## 2020-05-23 MED ORDER — CEFAZOLIN SODIUM-DEXTROSE 2-4 GM/100ML-% IV SOLN
INTRAVENOUS | Status: AC
  Filled 2020-05-23: qty 100

## 2020-05-23 MED ORDER — GLYCOPYRROLATE 0.2 MG/ML IJ SOLN
INTRAMUSCULAR | Status: DC | PRN
  Administered 2020-05-23: .2 mg via INTRAVENOUS

## 2020-05-23 MED ORDER — MIDAZOLAM HCL 2 MG/2ML IJ SOLN
INTRAMUSCULAR | Status: AC
  Filled 2020-05-23: qty 2

## 2020-05-23 MED ORDER — LIDOCAINE HCL (CARDIAC) PF 100 MG/5ML IV SOSY
PREFILLED_SYRINGE | INTRAVENOUS | Status: DC | PRN
  Administered 2020-05-23: 100 mg via INTRAVENOUS

## 2020-05-23 MED ORDER — DEXAMETHASONE SODIUM PHOSPHATE 10 MG/ML IJ SOLN
INTRAMUSCULAR | Status: AC
  Filled 2020-05-23: qty 1

## 2020-05-23 MED ORDER — LIDOCAINE HCL (PF) 2 % IJ SOLN
INTRAMUSCULAR | Status: AC
  Filled 2020-05-23: qty 5

## 2020-05-23 MED ORDER — ONDANSETRON HCL 4 MG/2ML IJ SOLN
INTRAMUSCULAR | Status: DC | PRN
  Administered 2020-05-23: 4 mg via INTRAVENOUS

## 2020-05-23 MED ORDER — CHLORHEXIDINE GLUCONATE 0.12 % MT SOLN
OROMUCOSAL | Status: AC
  Administered 2020-05-23: 15 mL via OROMUCOSAL
  Filled 2020-05-23: qty 15

## 2020-05-23 MED ORDER — FENTANYL CITRATE (PF) 100 MCG/2ML IJ SOLN
INTRAMUSCULAR | Status: DC | PRN
  Administered 2020-05-23: 50 ug via INTRAVENOUS
  Administered 2020-05-23: 100 ug via INTRAVENOUS

## 2020-05-23 MED ORDER — SUCCINYLCHOLINE CHLORIDE 20 MG/ML IJ SOLN
INTRAMUSCULAR | Status: DC | PRN
  Administered 2020-05-23: 100 mg via INTRAVENOUS

## 2020-05-23 MED ORDER — LACTATED RINGERS IV SOLN: INTRAVENOUS | Status: DC

## 2020-05-23 MED ORDER — CEFAZOLIN SODIUM 1 G IJ SOLR
INTRAMUSCULAR | Status: AC
  Filled 2020-05-23: qty 10

## 2020-05-23 MED ORDER — ORAL CARE MOUTH RINSE: 15.0000 mL | Freq: Once | OROMUCOSAL | Status: AC

## 2020-05-23 MED ORDER — CEFAZOLIN SODIUM-DEXTROSE 1-4 GM/50ML-% IV SOLN
INTRAVENOUS | Status: DC | PRN
  Administered 2020-05-23: 1 g via INTRAVENOUS

## 2020-05-23 MED ORDER — CHLORHEXIDINE GLUCONATE 0.12 % MT SOLN: 15.0000 mL | Freq: Once | OROMUCOSAL | Status: AC

## 2020-05-23 MED ORDER — REMIFENTANIL HCL 1 MG IV SOLR
INTRAVENOUS | Status: DC | PRN
  Administered 2020-05-23: .2 ug/kg/min via INTRAVENOUS

## 2020-05-23 SURGICAL SUPPLY — 86 items
APL PRP STRL LF DISP 70% ISPRP (MISCELLANEOUS) ×2
APPLIER CLIP 11 MED OPEN (CLIP)
APPLIER CLIP 13 LRG OPEN (CLIP)
APR CLP LRG 13 20 CLIP (CLIP)
APR CLP MED 11 20 MLT OPN (CLIP)
BAG DECANTER FOR FLEXI CONT (MISCELLANEOUS) ×4 IMPLANT
BINDER BREAST LRG (GAUZE/BANDAGES/DRESSINGS) IMPLANT
BINDER BREAST MEDIUM (GAUZE/BANDAGES/DRESSINGS) IMPLANT
BINDER BREAST XLRG (GAUZE/BANDAGES/DRESSINGS) ×2 IMPLANT
BINDER BREAST XXLRG (GAUZE/BANDAGES/DRESSINGS) IMPLANT
BLADE PHOTON ILLUMINATED (MISCELLANEOUS) ×2 IMPLANT
BLADE SURG 15 STRL SS SAFETY (BLADE) ×4 IMPLANT
BULB RESERV EVAC DRAIN JP 100C (MISCELLANEOUS) ×4 IMPLANT
CANISTER SUCT 1200ML W/VALVE (MISCELLANEOUS) ×4 IMPLANT
CHLORAPREP W/TINT 26 (MISCELLANEOUS) ×4 IMPLANT
CLIP APPLIE 11 MED OPEN (CLIP) IMPLANT
CLIP APPLIE 13 LRG OPEN (CLIP) IMPLANT
CLIP VESOCCLUDE MED 6/CT (CLIP) ×2 IMPLANT
CLOSURE WOUND 1/2 X4 (GAUZE/BANDAGES/DRESSINGS) ×1
CNTNR SPEC 2.5X3XGRAD LEK (MISCELLANEOUS) ×6
CONT SPEC 4OZ STER OR WHT (MISCELLANEOUS) ×6
CONT SPEC 4OZ STRL OR WHT (MISCELLANEOUS) ×6
CONTAINER SPEC 2.5X3XGRAD LEK (MISCELLANEOUS) ×6 IMPLANT
COVER LIGHT HANDLE STERIS (MISCELLANEOUS) ×8 IMPLANT
COVER PROBE FLX POLY STRL (MISCELLANEOUS) ×4 IMPLANT
COVER WAND RF STERILE (DRAPES) ×4 IMPLANT
DECANTER SPIKE VIAL GLASS SM (MISCELLANEOUS) ×8 IMPLANT
DEVICE DUBIN SPECIMEN MAMMOGRA (MISCELLANEOUS) IMPLANT
DRAIN CHANNEL JP 15F RND 16 (MISCELLANEOUS) ×4 IMPLANT
DRAPE C-ARM XRAY 36X54 (DRAPES) ×4 IMPLANT
DRAPE LAPAROTOMY TRNSV 106X77 (MISCELLANEOUS) ×4 IMPLANT
DRSG GAUZE FLUFF 36X18 (GAUZE/BANDAGES/DRESSINGS) ×6 IMPLANT
DRSG TEGADERM 2-3/8X2-3/4 SM (GAUZE/BANDAGES/DRESSINGS) ×4 IMPLANT
DRSG TEGADERM 4X4.75 (GAUZE/BANDAGES/DRESSINGS) ×6 IMPLANT
DRSG TELFA 3X8 NADH (GAUZE/BANDAGES/DRESSINGS) ×8 IMPLANT
DRSG TELFA 4X3 1S NADH ST (GAUZE/BANDAGES/DRESSINGS) ×4 IMPLANT
ELECT CAUTERY BLADE TIP 2.5 (TIP) ×4
ELECT REM PT RETURN 9FT ADLT (ELECTROSURGICAL) ×4
ELECTRODE CAUTERY BLDE TIP 2.5 (TIP) ×2 IMPLANT
ELECTRODE REM PT RTRN 9FT ADLT (ELECTROSURGICAL) ×2 IMPLANT
GAUZE SPONGE 4X4 12PLY STRL (GAUZE/BANDAGES/DRESSINGS) ×4 IMPLANT
GLOVE BIO SURGEON STRL SZ7.5 (GLOVE) ×4 IMPLANT
GLOVE INDICATOR 8.0 STRL GRN (GLOVE) ×4 IMPLANT
GOWN STRL REUS W/ TWL LRG LVL3 (GOWN DISPOSABLE) ×4 IMPLANT
GOWN STRL REUS W/TWL LRG LVL3 (GOWN DISPOSABLE) ×8
IV NS 500ML (IV SOLUTION) ×4
IV NS 500ML BAXH (IV SOLUTION) ×2 IMPLANT
KIT PORT POWER 8FR ISP CVUE (Port) ×4 IMPLANT
KIT TURNOVER KIT A (KITS) ×4 IMPLANT
LABEL OR SOLS (LABEL) ×4 IMPLANT
NDL FILTER BLUNT 18X1 1/2 (NEEDLE) IMPLANT
NDL HYPO 25X1 1.5 SAFETY (NEEDLE) ×2 IMPLANT
NEEDLE FILTER BLUNT 18X 1/2SAF (NEEDLE)
NEEDLE FILTER BLUNT 18X1 1/2 (NEEDLE) IMPLANT
NEEDLE HYPO 25X1 1.5 SAFETY (NEEDLE) ×4 IMPLANT
NS IRRIG 500ML POUR BTL (IV SOLUTION) ×4 IMPLANT
PACK BASIN MINOR (MISCELLANEOUS) ×4 IMPLANT
PACK PORT-A-CATH (MISCELLANEOUS) ×4 IMPLANT
PAD DRESSING TELFA 3X8 NADH (GAUZE/BANDAGES/DRESSINGS) ×2 IMPLANT
PIN SAFETY STRL (MISCELLANEOUS) ×4 IMPLANT
RETRACTOR RING XSMALL (MISCELLANEOUS) IMPLANT
RTRCTR WOUND ALEXIS 13CM XS SH (MISCELLANEOUS)
SHEARS FOC LG CVD HARMONIC 17C (MISCELLANEOUS) ×2 IMPLANT
SLEVE PROBE SENORX GAMMA FIND (MISCELLANEOUS) ×4 IMPLANT
SPONGE LAP 18X18 RF (DISPOSABLE) ×8 IMPLANT
STAPLER SKIN PROX 35W (STAPLE) ×4 IMPLANT
STRIP CLOSURE SKIN 1/2X4 (GAUZE/BANDAGES/DRESSINGS) ×5 IMPLANT
SUT ETHILON 3-0 FS-10 30 BLK (SUTURE) ×4
SUT MAXON ABS #0 GS21 30IN (SUTURE) ×2 IMPLANT
SUT PROLENE 3 0 SH DA (SUTURE) ×2 IMPLANT
SUT SILK 2 0 (SUTURE) ×4
SUT SILK 2-0 30XBRD TIE 12 (SUTURE) ×2 IMPLANT
SUT VIC AB 2-0 CT1 27 (SUTURE) ×36
SUT VIC AB 2-0 CT1 TAPERPNT 27 (SUTURE) ×6 IMPLANT
SUT VIC AB 3-0 54X BRD REEL (SUTURE) ×2 IMPLANT
SUT VIC AB 3-0 BRD 54 (SUTURE)
SUT VIC AB 3-0 SH 27 (SUTURE) ×8
SUT VIC AB 3-0 SH 27X BRD (SUTURE) ×2 IMPLANT
SUT VIC AB 4-0 FS2 27 (SUTURE) ×4 IMPLANT
SUT VIC AB 4-0 PS2 18 (SUTURE) ×4 IMPLANT
SUT VICRYL+ 3-0 144IN (SUTURE) ×2 IMPLANT
SUTURE EHLN 3-0 FS-10 30 BLK (SUTURE) ×2 IMPLANT
SWABSTK COMLB BENZOIN TINCTURE (MISCELLANEOUS) ×4 IMPLANT
SYR 10ML LL (SYRINGE) ×4 IMPLANT
SYR 10ML SLIP (SYRINGE) ×4 IMPLANT
TAPE TRANSPORE STRL 2 31045 (GAUZE/BANDAGES/DRESSINGS) ×4 IMPLANT

## 2020-05-23 NOTE — Anesthesia Procedure Notes (Signed)
Procedure Name: Intubation Date/Time: 05/23/2020 12:53 PM Performed by: Rudean Hitt, CRNA Pre-anesthesia Checklist: Patient identified, Patient being monitored, Timeout performed, Emergency Drugs available and Suction available Patient Re-evaluated:Patient Re-evaluated prior to induction Oxygen Delivery Method: Circle system utilized Preoxygenation: Pre-oxygenation with 100% oxygen Induction Type: IV induction Ventilation: Mask ventilation without difficulty and Oral airway inserted - appropriate to patient size Laryngoscope Size: McGraph and 4 (Elective Mcgrath. Patient has pertruding front teeth) Grade View: Grade I Tube type: Oral Tube size: 7.0 mm Number of attempts: 1 Airway Equipment and Method: Stylet Placement Confirmation: ETT inserted through vocal cords under direct vision,  positive ETCO2 and breath sounds checked- equal and bilateral Secured at: 21 cm Tube secured with: Tape Dental Injury: Teeth and Oropharynx as per pre-operative assessment

## 2020-05-23 NOTE — Op Note (Addendum)
Preoperative diagnosis: Advanced right breast cancer, stage IV.  Need for central venous access.  Postoperative diagnosis: Same.  Operative procedure: Right modified radical mastectomy with flap elevation for primary closure, left subclavian PowerPort placement with ultrasound and fluoroscopic guidance.  Operating surgeon: Hervey Ard, MD.  Anesthesia: General endotracheal, Marcaine 0.5% with 1: 200,000 units of epinephrine, 10 cc.  Estimated blood loss: 75 cc.  Clinical note: This 69 year old woman presented with an ulcerated mass where her right breast had previously resided.  PET scan shows metastatic disease.  She is felt to be a candidate for salvage mastectomy and PowerPort placement.  Operative note: The patient received Ancef 2 g intravenously on induction of anesthesia.  Skin flaps were outlined using the palpable tumor as a guide and trying to maintain 1 cm distance from this.  This resulted in a 25 cm ellipse on the superior and inferior aspects.  The skin was incised sharply and then the remaining dissection was completed with the photon blade.  Flaps were elevated to the area above the clavicle superiorly, cross the midline medially, approximately 8 cm below the inframammary fold inferiorly and laterally to the posterior axillary line.  The breast was elevated off the underlying pectoralis muscle taking the fascia of that muscle with the specimen.  Beneath the central tumor mass the pectoralis muscle was taken with the specimen.  The breast was amputated at the axillary tail and sent fresh for pathology review.  Attention was then turned towards axillary dissection.  The axillary vein was identified as well as the long thoracic nerve of Bell and thoracodorsal nerve artery and vein bundle.  These were protected while the axillary contents from behind the pectoralis minor muscle swept inferiorly and laterally.  This was sent in formalin for routine histology.  Multiple palpable nodes  were evident.  Good hemostasis was appreciated and both nerves were functioning at the end of the procedure.  Three hemoclips were placed at the apex of the axillary dissection.  A Blake drain was brought out through the inferior medial aspect of the flap and anchored in place with 3-0 nylon suture.  The flaps were then approximated with interrupted 2-0 Vicryl figure-of-eight sutures after taking tension off the wound with 0 Maxon transcutaneous sutures which were removed at the end of the case.  Minimal blanching of the skin.  Staples were applied.  Wound closure of this very large defect added approximately one hour to the procedure.   The area was covered with a towel and attention was turned to the left chest for port placement.  The patient was redosed with 1 g of Ancef.  Ultrasound was used to confirm patency of the left subclavian vein.  This was cannulated under ultrasound guidance and a permanent image was obtained for storage.  The guidewire was passed followed by the dilator.  The catheter was initially terminated near the junction of the SVC and right atrium.  This was then tunneled to a pocket on the left anterior chest.  It was in this location a local anesthesia was utilized.  The port was transfixed with 3, 3-0 Prolene sutures to minimize motion.  The catheter easily irrigated and aspirated at the end of the procedure.  The subcutaneous fat was approximated with a running 3-0 Vicryl suture.  The skin was closed with a running 4-0 Vicryl subcuticular suture.  Benzoin Steri-Strips followed by Telfa and Tegaderm dressings were applied.  Erect portable chest x-ray in recovery room showed the catheter in the proximal  SVC.  No evidence of pneumothorax.  The patient tolerated the procedure well.

## 2020-05-23 NOTE — Progress Notes (Signed)
Patient was concerned about consent stating "left breast biopsy". Anesthesia spoke with surgeon and verified that we are not doing that. I spoke with surgeon to get verification that we are in fact not doing that. Spoke with patient. Marked out left breast biopsy on consent with my initials and patients initials.

## 2020-05-23 NOTE — Discharge Instructions (Signed)
[facility] Surgical drains are used to remove extra fluid that normally builds up in a surgical wound after surgery. A surgical drain helps to heal a surgical wound. Different kinds of surgical drains include:  Active drains. These drains use suction to pull drainage away from the surgical wound. Drainage flows through a tube to a container outside of the body. With these drains, you need to keep the bulb or the drainage container flat (compressed) at all times, except while you empty it. Flattening the bulb or container creates suction.  Passive drains. These drains allow fluid to drain naturally, by gravity. Drainage flows through a tube to a bandage (dressing) or a container outside of the body. Passive drains do not need to be emptied. A drain is placed during surgery. Right after surgery, drainage is usually bright red and a little thicker than water. The drainage may gradually turn yellow or pink and become thinner. It is likely that your health care provider will remove the drain when the drainage stops or when the amount decreases to 1-2 Tbsp (15-30 mL) during a 24-hour period. Supplies needed:  Tape.  Germ-free cleaning solution (sterile saline).  Cotton swabs.  Split gauze drain sponge: 4 x 4 inches (10 x 10 cm).  Gauze square: 4 x 4 inches (10 x 10 cm). How to care for your surgical drain Care for your drain as told by your health care provider. This is important to help prevent infection. If your drain is placed at your back, or any other hard-to-reach area, ask another person to assist you in performing the following tasks: General care  Keep the skin around the drain dry and covered with a dressing at all times.  Check your drain area every day for signs of infection. Check for: ? Redness, swelling, or pain. ? Pus or a bad smell. ? Cloudy drainage. ? Tenderness or pressure at the drain exit site. Changing the dressing Follow instructions from your health care  provider about how to change your dressing. Change your dressing at least once a day. Change it more often if needed to keep the dressing dry. Make sure you: 1. Gather your supplies. 2. Wash your hands with soap and water before you change your dressing. If soap and water are not available, use hand sanitizer. 3. Remove the old dressing. Avoid using scissors to do that. 4. Wash your hands with soap and water again after removing the old dressing. 5. Use sterile saline to clean your skin around the drain. You may need to use a cotton swab to clean the skin. 6. Place the tube through the slit in a drain sponge. Place the drain sponge so that it covers your wound. 7. Place the gauze square or another drain sponge on top of the drain sponge that is on the wound. Make sure the tube is between those layers. 8. Tape the dressing to your skin. 9. Tape the drainage tube to your skin 1-2 inches (2.5-5 cm) below the place where the tube enters your body. Taping keeps the tube from pulling on any stitches (sutures) that you have. 10. Wash your hands with soap and water. 11. Write down the color of your drainage and how often you change your dressing. How to empty your active drain  1. Make sure that you have a measuring cup that you can empty your drainage into. 2. Wash your hands with soap and water. If soap and water are not available, use hand sanitizer. 3.  Loosen any pins or clips that hold the tube in place. 4. If your health care provider tells you to strip the tube to prevent clots and tube blockages: ? Hold the tube at the skin with one hand. Use your other hand to pinch the tubing with your thumb and first finger. ? Gently move your fingers down the tube while squeezing very lightly. This clears any drainage, clots, or tissue from the tube. ? You may need to do this several times each day to keep the tube clear. Do not pull on the tube. 5. Open the bulb cap or the drain plug. Do not touch the  inside of the cap or the bottom of the plug. 6. Turn the device upside down and gently squeeze. 7. Empty all of the drainage into the measuring cup. 8. Compress the bulb or the container and replace the cap or the plug. To compress the bulb or the container, squeeze it firmly in the middle while you close the cap or plug the container. 9. Write down the amount of drainage that you have in each 24-hour period. If you have less than 2 Tbsp (30 mL) of drainage during 24 hours, contact your health care provider. 10. Flush the drainage down the toilet. 11. Wash your hands with soap and water. Contact a health care provider if:  You have redness, swelling, or pain around your drain area.  You have pus or a bad smell coming from your drain area.  You have a fever or chills.  The skin around your drain is warm to the touch.  The amount of drainage that you have is increasing instead of decreasing.  You have drainage that is cloudy.  There is a sudden stop or a sudden decrease in the amount of drainage that you have.  Your drain tube falls out.  Your active drain does not stay compressed after you empty it. Summary  Surgical drains are used to remove extra fluid that normally builds up in a surgical wound after surgery.  Different kinds of surgical drains include active drains and passive drains. Active drains use suction to pull drainage away from the surgical wound, and passive drains allow fluid to drain naturally.  It is important to care for your drain to prevent infection. If your drain is placed at your back, or any other hard-to-reach area, ask another person to assist you.  Contact your health care provider if you have redness, swelling, or pain around your drain area. This information is not intended to replace advice given to you by your health care provider. Make sure you discuss any questions you have with your health care provider. Document Revised: 10/27/2018 Document  Reviewed: 10/27/2018 Elsevier Patient Education  2020 [place]   1) The drugs that you were given will stay in your system until tomorrow so for the next 24 hours you should not:  A) Drive an automobile B) Make any legal decisions C) Drink any alcoholic beverage   2) You may resume regular meals tomorrow.  Today it is better to start with liquids and gradually work up to solid foods.  You may eat anything you prefer, but it is better to start with liquids, then soup and crackers, and gradually work up to  solid foods.   3) Please notify your doctor immediately if you have any unusual bleeding, trouble breathing, redness and pain at the surgery site, drainage, fever, or pain not relieved by medication.    4) Additional Instructions:        Please contact your physician with any problems or Same Day Surgery at [phone], Monday through Friday 6 am to 4 pm, or [place] at [facility] number at [phone].

## 2020-05-23 NOTE — Anesthesia Postprocedure Evaluation (Signed)
Anesthesia Post Note  Patient: Jeanna Giuffre  Procedure(s) Performed: MASTECTOMY MODIFIED RADICAL, WITH EXCISION OF AXILLARY CONTENTS (Right ) INSERTION PORT-A-CATH (Left )  Patient location during evaluation: PACU Anesthesia Type: General Level of consciousness: awake and alert Pain management: pain level controlled Vital Signs Assessment: post-procedure vital signs reviewed and stable Respiratory status: spontaneous breathing, nonlabored ventilation, respiratory function stable and patient connected to nasal cannula oxygen Cardiovascular status: blood pressure returned to baseline and stable Postop Assessment: no apparent nausea or vomiting Anesthetic complications: no   No complications documented.   Last Vitals:  Vitals:   05/23/20 1718 05/23/20 1740  BP: (!) 146/70 136/69  Pulse: 74 64  Resp: 10 16  Temp:  (!) 36.2 C  SpO2: 96% 99%    Last Pain:  Vitals:   05/23/20 1740  TempSrc: Temporal  PainSc: 1                  Arita Miss

## 2020-05-23 NOTE — Anesthesia Preprocedure Evaluation (Addendum)
Anesthesia Evaluation  Patient identified by MRN, date of birth, ID band Patient awake    Reviewed: Allergy & Precautions, NPO status , Patient's Chart, lab work & pertinent test results  History of Anesthesia Complications Negative for: history of anesthetic complications  Airway Mallampati: II       Dental   Pulmonary neg sleep apnea, neg COPD, Not current smoker,           Cardiovascular (-) hypertension(-) Past MI and (-) CHF (-) dysrhythmias + Valvular Problems/Murmurs (murmur, no tx)      Neuro/Psych neg Seizures Anxiety    GI/Hepatic Neg liver ROS, neg GERD  ,  Endo/Other  diabetes (borderline, no meds), Type 2  Renal/GU negative Renal ROS     Musculoskeletal   Abdominal   Peds  Hematology   Anesthesia Other Findings   Reproductive/Obstetrics                             Anesthesia Physical Anesthesia Plan  ASA: III  Anesthesia Plan: General   Post-op Pain Management:    Induction: Intravenous  PONV Risk Score and Plan: 3 and Ondansetron, Dexamethasone and Treatment may vary due to age or medical condition  Airway Management Planned: Oral ETT  Additional Equipment:   Intra-op Plan:   Post-operative Plan:   Informed Consent: I have reviewed the patients History and Physical, chart, labs and discussed the procedure including the risks, benefits and alternatives for the proposed anesthesia with the patient or authorized representative who has indicated his/her understanding and acceptance.       Plan Discussed with:   Anesthesia Plan Comments:        Anesthesia Quick Evaluation

## 2020-05-23 NOTE — H&P (Signed)
Kristy York [id number] [birth year]     HPI:  69 y/o woman with Stage IV breast cancer with ulcerated mass involving the right breast for toilet mastectomy.  Power port placement for planned adjuvant chemotherapy.   Medications Prior to Admission  Medication Sig Dispense Refill Last Dose  . ALPRAZolam (XANAX) 0.25 MG tablet Take 1 tablet (0.25 mg total) by mouth 2 (two) times daily as needed for anxiety. 60 tablet 0 Past Month at Unknown time  . letrozole (FEMARA) 2.5 MG tablet Take 1 tablet (2.5 mg total) by mouth daily. 90 tablet 3 05/21/2020   Allergies  Allergen Reactions  . Nickel    Past Medical History:  Diagnosis Date  . Anxiety   . Arthritis   . Diet-controlled diabetes mellitus ([place])    borderline  . Fatigue   . HLD (hyperlipidemia)   . Invasive carcinoma of breast ([place]) 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; ER/PR (+), HER2/neu (-)  . Metastasis to bone [facility])    Breast primary  . Murmur, cardiac   . Peripheral edema   . T wave inversion on electrocardiogram 05/17/2020   leads I, II, III, aVF, V3-V6   Past Surgical History:  Procedure Laterality Date  . BREAST BIOPSY Right 05/02/2020   [place] Bx, Q clip, path pending  . BREAST BIOPSY Right 05/02/2020   [place] Axilla Bx, path pending  . CHOLECYSTECTOMY    . COLONOSCOPY    . LAPAROSCOPIC ABDOMINAL EXPLORATION     Social History   Socioeconomic History  . Marital status: Divorced    Spouse name: Not on file  . Number of children: Not on file  . Years of education: Not on file  . Highest education level: Not on file  Occupational History  . Not on file  Tobacco Use  . Smoking status: Never Smoker  . Smokeless tobacco: Never Used  Vaping Use  . Vaping Use: Never used  Substance and Sexual Activity  . Alcohol use: Never  . Drug use: Never  . Sexual activity: Not on file  Other Topics Concern  . Not on file  Social History Narrative  . Not on file   Social Determinants of Health   Financial Resource Strain:   .  Difficulty of Paying Living Expenses:   Food Insecurity:   . Worried About Charity fundraiser in the Last Year:   . Arboriculturist in the Last Year:   Transportation Needs:   . Film/video editor (Medical):   Marland Kitchen Lack of Transportation (Non-Medical):   Physical Activity:   . Days of Exercise per Week:   . Minutes of Exercise per Session:   Stress:   . Feeling of Stress :   Social Connections:   . Frequency of Communication with Friends and Family:   . Frequency of Social Gatherings with Friends and Family:   . Attends Religious Services:   . Active Member of Clubs or Organizations:   . Attends Archivist Meetings:   Marland Kitchen Marital Status:   Intimate Partner Violence:   . Fear of Current or Ex-Partner:   . Emotionally Abused:   Marland Kitchen Physically Abused:   . Sexually Abused:    Social History   Social History Narrative  . Not on file     ROS: Negative.     PE: HEENT: Negative. Lungs: Clear. Cardio: RR.   Assessment/Plan:  Proceed with planned right modified radical mastectomy and left power port placement. Based on PET/CT results, biopsy of the  previously noted left axillary node is not required.    Forest Gleason [facility] 05/23/2020

## 2020-05-23 NOTE — Transfer of Care (Signed)
Immediate Anesthesia Transfer of Care Note  Patient: Kristy York  Procedure(s) Performed: MASTECTOMY MODIFIED RADICAL (Right ) INSERTION PORT-A-CATH (Left ) AXILLARY LYMPH NODE BIOPSY (Left )  Patient Location: PACU  Anesthesia Type:General  Level of Consciousness: drowsy  Airway & Oxygen Therapy: Patient Spontanous Breathing and Patient connected to face mask oxygen  Post-op Assessment: Report given to RN and Post -op Vital signs reviewed and stable  Post vital signs: Reviewed and stable  Last Vitals:  Vitals Value Taken Time  BP 145/75 05/23/20 1633  Temp 36.2 C 05/23/20 1633  Pulse 89 05/23/20 1636  Resp 22 05/23/20 1636  SpO2 99 % 05/23/20 1636  Vitals shown include unvalidated device data.  Last Pain:  Vitals:   05/23/20 1049  TempSrc: Oral  PainSc: 1          Complications: No complications documented.

## 2020-05-24 ENCOUNTER — Encounter: Payer: Self-pay | Admitting: General Surgery

## 2020-05-25 ENCOUNTER — Other Ambulatory Visit: Payer: Self-pay | Admitting: Anatomic Pathology & Clinical Pathology

## 2020-05-25 LAB — SURGICAL PATHOLOGY

## 2020-06-15 ENCOUNTER — Other Ambulatory Visit: Payer: Self-pay | Admitting: *Deleted

## 2020-06-15 ENCOUNTER — Other Ambulatory Visit: Payer: Self-pay

## 2020-06-15 ENCOUNTER — Encounter: Payer: Self-pay | Admitting: Oncology

## 2020-06-15 DIAGNOSIS — C50911 Malignant neoplasm of unspecified site of right female breast: Secondary | ICD-10-CM

## 2020-06-15 DIAGNOSIS — Z170 Estrogen receptor positive status [ER+]: Secondary | ICD-10-CM

## 2020-06-15 NOTE — Progress Notes (Signed)
Called patient for pre assessment. Spoke to patient's son. He denied any concerns at this time. Would like to have provider go over treatment with them with side effects

## 2020-06-16 NOTE — Progress Notes (Signed)
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  INTERVAL HISTORY: Patient returns to clinic today for further evaluation and initiation of Zometa.  She underwent palliative mastectomy and axillary node sampling on May 23, 2020.  She is tolerating letrozole without significant side effects.  She continues to be highly anxious, but otherwise feels well.  She has no neurologic complaints. She denies any recent fevers or illnesses.  She has a fair appetite, but denies weight loss.  She has no chest pain, shortness of breath, cough, or hemoptysis.  She denies any nausea, vomiting, constipation, or diarrhea.  She has no urinary complaints.  [name]  REVIEW OF SYSTEMS:   Review of Systems  Constitutional: Negative.  Negative for fever, malaise/fatigue and weight loss.  Respiratory: Negative.  Negative for cough, hemoptysis and shortness of breath.   Cardiovascular: Negative.  Negative for chest pain and leg swelling.  Gastrointestinal: Negative.  Negative for abdominal pain.  Genitourinary: Negative.  Negative for dysuria.  Musculoskeletal: Negative.  Negative for back pain.  Skin: Negative.  Negative for rash.  Neurological: Negative.  Negative for dizziness, focal weakness, weakness and headaches.  Psychiatric/Behavioral: The patient is nervous/anxious.     As per HPI. Otherwise, a complete review of systems is negative.  PAST MEDICAL HISTORY: Past Medical History:  Diagnosis Date  . Anxiety   . Arthritis   . Diet-controlled diabetes mellitus ([place])    borderline  . Fatigue   . HLD (hyperlipidemia)     . Invasive carcinoma of breast ([place]) 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; ER/PR (+), HER2/neu (-)  . Metastasis to bone [facility])    Breast primary  . Murmur, cardiac   . Peripheral edema   . T wave inversion on electrocardiogram 05/17/2020   leads I, II, III, aVF, V3-V6    PAST SURGICAL HISTORY: Past Surgical History:  Procedure Laterality Date  . BREAST BIOPSY Right 05/02/2020   [place] Bx, Q clip, path pending  . BREAST BIOPSY Right 05/02/2020   [place] Axilla Bx, path pending  . CHOLECYSTECTOMY    . COLONOSCOPY    . LAPAROSCOPIC ABDOMINAL EXPLORATION    . MASTECTOMY MODIFIED RADICAL Right 05/23/2020   Procedure: MASTECTOMY MODIFIED RADICAL, WITH EXCISION OF AXILLARY CONTENTS;  Surgeon: Robert Bellow, MD;  Location: [facility] ORS;  Service: General;  Laterality: Right;  . PORTACATH PLACEMENT Left 05/23/2020   Procedure: INSERTION PORT-A-CATH;  Surgeon: Robert Bellow, MD;  Location: [facility] ORS;  Service: General;  Laterality: Left;    FAMILY HISTORY: Family History  Problem Relation Age of Onset  . Breast cancer Sister     ADVANCED DIRECTIVES (Y/N):  N  HEALTH MAINTENANCE: Social History   Tobacco Use  . Smoking status: Never Smoker  . Smokeless tobacco: Never Used  Vaping Use  . Vaping Use: Never used  Substance Use Topics  . Alcohol use: Never  . Drug use: Never     Colonoscopy:  PAP:  Bone density:  Lipid panel:  Allergies  Allergen Reactions  . Nickel     Current Outpatient Medications  Medication Sig Dispense Refill  . ALPRAZolam (XANAX) 0.25 MG tablet Take 1 tablet (0.25 mg total)  by mouth 2 (two) times daily as needed for anxiety. 60 tablet 0  . HYDROcodone-acetaminophen (NORCO/VICODIN) 5-325 MG tablet Take 1 tablet by mouth every 4 (four) hours as needed for moderate pain. 30 tablet 0  . letrozole (FEMARA) 2.5 MG tablet Take 1 tablet (2.5 mg total) by mouth daily. 90 tablet 3  . metoprolol succinate (TOPROL-XL) 25 MG 24 hr tablet Take 25 mg by mouth daily.    Marland Kitchen  lidocaine-prilocaine (EMLA) cream Apply 1 application topically See admin instructions. APPLY A SMALL AMOUNT TO PORT SITE AT LEAST 1 HOUR PRIOR TO IT BEING ACCESSED THEN COVER WITH PLASTIC WRAP 30 g 3   No current facility-administered medications for this visit.    OBJECTIVE: Vitals:   06/18/20 1021  BP: (!) 148/71  Pulse: 73  Resp: 20  Temp: 97.8 F (36.6 C)  SpO2: 100%     Body mass index is 30.54 kg/m.    ECOG FS:0 - Asymptomatic  General: Well-developed, well-nourished, no acute distress. Eyes: Pink conjunctiva, anicteric sclera. HEENT: Normocephalic, moist mucous membranes. Breast: Right mastectomy with drains in place. Lungs: No audible wheezing or coughing. Heart: Regular rate and rhythm. Abdomen: Soft, nontender, no obvious distention. Musculoskeletal: No edema, cyanosis, or clubbing. Neuro: Alert, answering all questions appropriately. Cranial nerves grossly intact. Skin: No rashes or petechiae noted. Psych: Normal affect.   LAB RESULTS:  Lab Results  Component Value Date   NA 142 06/18/2020   K 3.9 06/18/2020   CL 107 06/18/2020   CO2 26 06/18/2020   GLUCOSE 129 (H) 06/18/2020   BUN 13 06/18/2020   CREATININE 0.94 06/18/2020   CALCIUM 9.1 06/18/2020   GFRNONAA >60 06/18/2020   GFRAA >60 06/18/2020    No results found for: WBC, NEUTROABS, HGB, HCT, MCV, PLT   STUDIES: DG Chest Port 1 View  Result Date: 05/23/2020 CLINICAL DATA:  Port-A-Cath placement. EXAM: PORTABLE CHEST 1 VIEW COMPARISON:  PET-CT 05/16/2020 FINDINGS: The left subclavian Port-A-Cath tip is in the mid SVC near the level of the carina. The cardiac silhouette, mediastinal and hilar contours are within normal limits. Surgical changes related to recent breast cancer surgery with a surgical drain in place. No acute pulmonary findings. IMPRESSION: Left subclavian Port-A-Cath in good position without complicating features. Electronically Signed   By: Marijo Sanes M.D.   On: 05/23/2020 17:41    DG C-Arm 1-60 Min-No Report  Result Date: 05/23/2020 Fluoroscopy was utilized by the requesting physician.  No radiographic interpretation.    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  PLAN:    1. 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  Patient underwent palliative mastectomy with bilateral lymph node sampling on May 23, 2020.  Continue letrozole indefinitely.  We also discussed the option of adding in chemotherapy with oral Ibrance or possibly IV treatment, but patient is not interested at this time.  Proceed with Zometa today.  Return to clinic in 4 weeks for further evaluation and continuation of treatment.     I spent a total of 30 minutes reviewing chart data, face-to-face evaluation with the patient, counseling and coordination of care as detailed above.   Patient expressed understanding and was in  agreement with this plan. She also understands that She can call clinic at any time with any questions, concerns, or complaints.   Cancer Staging Breast cancer, right [facility]) Staging form: Breast, AJCC 8th Edition - Clinical: Stage IV (cT4b, cN2, cM1, ER+, PR+, HER2-) - Signed by Lloyd Huger, MD on 05/17/2020   Lloyd Huger, MD   06/18/2020 1:35 PM

## 2020-06-18 ENCOUNTER — Other Ambulatory Visit: Payer: Self-pay

## 2020-06-18 ENCOUNTER — Inpatient Hospital Stay (HOSPITAL_BASED_OUTPATIENT_CLINIC_OR_DEPARTMENT_OTHER): Payer: Medicare PPO | Admitting: Oncology

## 2020-06-18 ENCOUNTER — Encounter: Payer: Self-pay | Admitting: Oncology

## 2020-06-18 ENCOUNTER — Inpatient Hospital Stay: Payer: Medicare PPO

## 2020-06-18 ENCOUNTER — Inpatient Hospital Stay: Payer: Medicare PPO | Attending: Oncology

## 2020-06-18 VITALS — BP 148/71 | HR 73 | Temp 97.8°F | Resp 20 | Wt 167.0 lb

## 2020-06-18 DIAGNOSIS — C7951 Secondary malignant neoplasm of bone: Secondary | ICD-10-CM | POA: Insufficient documentation

## 2020-06-18 DIAGNOSIS — Z170 Estrogen receptor positive status [ER+]: Secondary | ICD-10-CM | POA: Diagnosis not present

## 2020-06-18 DIAGNOSIS — C50911 Malignant neoplasm of unspecified site of right female breast: Secondary | ICD-10-CM | POA: Insufficient documentation

## 2020-06-18 DIAGNOSIS — Z79811 Long term (current) use of aromatase inhibitors: Secondary | ICD-10-CM | POA: Insufficient documentation

## 2020-06-18 LAB — BASIC METABOLIC PANEL
Anion gap: 9 (ref 5–15)
BUN: 13 mg/dL (ref 8–23)
CO2: 26 mmol/L (ref 22–32)
Calcium: 9.1 mg/dL (ref 8.9–10.3)
Chloride: 107 mmol/L (ref 98–111)
Creatinine, Ser: 0.94 mg/dL (ref 0.44–1.00)
GFR calc Af Amer: >60 mL/min (ref >60)
GFR calc non Af Amer: >60 mL/min (ref >60)
Glucose, Bld: 129 mg/dL — ABNORMAL HIGH (ref 70–99)
Potassium: 3.9 mmol/L (ref 3.5–5.1)
Sodium: 142 mmol/L (ref 135–145)

## 2020-06-18 MED ORDER — ZOLEDRONIC ACID 4 MG/100ML IV SOLN
4.0000 mg | Freq: Once | INTRAVENOUS | Status: AC
  Administered 2020-06-18: 4 mg via INTRAVENOUS
  Filled 2020-06-18: qty 100

## 2020-06-18 MED ORDER — HEPARIN SOD (PORK) LOCK FLUSH 100 UNIT/ML IV SOLN
INTRAVENOUS | Status: AC
  Filled 2020-06-18: qty 5

## 2020-06-18 MED ORDER — HEPARIN SOD (PORK) LOCK FLUSH 100 UNIT/ML IV SOLN
500.0000 [IU] | Freq: Once | INTRAVENOUS | Status: AC
  Administered 2020-06-18: 500 [IU] via INTRAVENOUS
  Filled 2020-06-18: qty 5

## 2020-06-18 MED ORDER — LIDOCAINE-PRILOCAINE 2.5-2.5 % EX CREA
1.0000 | TOPICAL_CREAM | CUTANEOUS | 3 refills | Status: DC
Start: 2020-06-18 — End: 2021-08-21

## 2020-06-18 MED ORDER — SODIUM CHLORIDE 0.9 % IV SOLN
Freq: Once | INTRAVENOUS | Status: AC
  Filled 2020-06-18: qty 250

## 2020-07-12 NOTE — Progress Notes (Signed)
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  INTERVAL HISTORY: Patient returns to clinic today for further evaluation and continuation of Zometa.  She continues to tolerate letrozole well without significant side effects.  She currently feels well and is asymptomatic.  She does not complain of pain. She has no neurologic complaints. She denies any recent fevers or illnesses.  She has a fair appetite, but denies weight loss.  She has no chest pain, shortness of breath, cough, or hemoptysis.  She denies any nausea, vomiting, constipation, or diarrhea.  She has no urinary complaints.  Patient offers no specific complaints today.    REVIEW OF SYSTEMS:   Review of Systems  Constitutional: Negative.  Negative for fever, malaise/fatigue and weight loss.  Respiratory: Negative.  Negative for cough, hemoptysis and shortness of breath.   Cardiovascular: Negative.  Negative for chest pain and leg swelling.  Gastrointestinal: Negative.  Negative for abdominal pain.  Genitourinary: Negative.  Negative for dysuria.  Musculoskeletal: Negative.  Negative for back pain.  Skin: Negative.  Negative for rash.  Neurological: Negative.  Negative for dizziness, focal weakness, weakness and headaches.  Psychiatric/Behavioral: The patient is nervous/anxious.     As per HPI. Otherwise, a complete review of systems is negative.  PAST MEDICAL HISTORY: Past Medical History:  Diagnosis Date  . Anxiety   . Arthritis   . Diet-controlled diabetes mellitus ([place])    borderline  . Fatigue   . HLD (hyperlipidemia)   . Invasive carcinoma of breast ([place]) 05/02/2020   Stage  IV; ER/PR (+), HER2/neu (-)  . Metastasis to bone [facility])    Breast primary  . Murmur, cardiac   . Peripheral edema   . T wave inversion on electrocardiogram 05/17/2020   leads I, II, III, aVF, V3-V6    PAST SURGICAL HISTORY: Past Surgical History:  Procedure Laterality Date  . BREAST BIOPSY Right 05/02/2020   [place] Bx, Q clip, path pending  . BREAST BIOPSY Right 05/02/2020   [place] Axilla Bx, path pending  . CHOLECYSTECTOMY    . COLONOSCOPY    . LAPAROSCOPIC ABDOMINAL EXPLORATION    . MASTECTOMY MODIFIED RADICAL Right 05/23/2020   Procedure: MASTECTOMY MODIFIED RADICAL, WITH EXCISION OF AXILLARY CONTENTS;  Surgeon: Robert Bellow, MD;  Location: [facility] ORS;  Service: General;  Laterality: Right;  . PORTACATH PLACEMENT Left 05/23/2020   Procedure: INSERTION PORT-A-CATH;  Surgeon: Robert Bellow, MD;  Location: [facility] ORS;  Service: General;  Laterality: Left;    FAMILY HISTORY: Family History  Problem Relation Age of Onset  . Breast cancer Sister     ADVANCED DIRECTIVES (Y/N):  N  HEALTH MAINTENANCE: Social History   Tobacco Use  . Smoking status: Never Smoker  . Smokeless tobacco: Never Used  Vaping Use  . Vaping Use: Never used  Substance Use Topics  . Alcohol use: Never  . Drug use: Never     Colonoscopy:  PAP:  Bone density:  Lipid panel:  Allergies  Allergen Reactions  . Nickel     Current Outpatient Medications  Medication Sig Dispense Refill  . letrozole (FEMARA) 2.5 MG tablet Take 1 tablet (2.5 mg total) by mouth daily. 90 tablet 3  .  lidocaine-prilocaine (EMLA) cream Apply 1 application topically See admin instructions. APPLY A SMALL AMOUNT TO PORT SITE AT LEAST 1 HOUR PRIOR TO IT BEING ACCESSED THEN COVER WITH PLASTIC WRAP 30 g 3  . metoprolol succinate (TOPROL-XL) 25 MG 24 hr tablet Take 25 mg by mouth daily.    Marland Kitchen ALPRAZolam (XANAX) 0.25 MG tablet Take 1 tablet (0.25 mg total) by mouth 2 (two) times daily as needed for anxiety. (Patient not taking:  Reported on 07/17/2020) 60 tablet 0  . HYDROcodone-acetaminophen (NORCO/VICODIN) 5-325 MG tablet Take 1 tablet by mouth every 4 (four) hours as needed for moderate pain. (Patient not taking: Reported on 07/17/2020) 30 tablet 0   No current facility-administered medications for this visit.   Facility-Administered Medications Ordered in Other Visits  Medication Dose Route Frequency Provider Last Rate Last Admin  . 0.9 %  sodium chloride infusion   Intravenous Once Lloyd Huger, MD      . heparin lock flush 100 unit/mL  500 Units Intravenous Once Lloyd Huger, MD      . heparin lock flush 100 unit/mL  500 Units Intracatheter Once PRN Lloyd Huger, MD      . Zoledronic Acid (ZOMETA) IVPB 4 mg  4 mg Intravenous Once Lloyd Huger, MD        OBJECTIVE: Vitals:   07/17/20 1012  BP: 139/62  Pulse: 64  Resp: 20  Temp: (!) 96.6 F (35.9 C)  SpO2: 100%     Body mass index is 30.76 kg/m.    ECOG FS:0 - Asymptomatic  General: Well-developed, well-nourished, no acute distress. Eyes: Pink conjunctiva, anicteric sclera. HEENT: Normocephalic, moist mucous membranes. Lungs: No audible wheezing or coughing. Heart: Regular rate and rhythm. Abdomen: Soft, nontender, no obvious distention. Musculoskeletal: No edema, cyanosis, or clubbing. Neuro: Alert, answering all questions appropriately. Cranial nerves grossly intact. Skin: No rashes or petechiae noted. Psych: Normal affect.  LAB RESULTS:  Lab Results  Component Value Date   NA 140 07/17/2020   K 3.9 07/17/2020   CL 107 07/17/2020   CO2 26 07/17/2020   GLUCOSE 118 (H) 07/17/2020   BUN 12 07/17/2020   CREATININE 0.75 07/17/2020   CALCIUM 8.7 (L) 07/17/2020   GFRNONAA >60 07/17/2020   GFRAA >60 06/18/2020    No results found for: WBC, NEUTROABS, HGB, HCT, MCV, PLT   STUDIES: No results found.  ASSESSMENT: Stage IV ER/PR positive, HER-2 negative invasive carcinoma of the right breast with metastatic  lesions to bone.  PLAN:    1. 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  Patient underwent palliative mastectomy with bilateral lymph node sampling on May 23, 2020.  Continue letrozole indefinitely.  We also discussed the option of adding in chemotherapy with oral Ibrance or possibly IV treatment, but patient is not interested at this time.  Proceed with Zometa today.  Return to clinic in 4 weeks for further evaluation and continuation of treatment. 2.  Hypocalcemia: Proceed with Zometa as above.  I spent a total of 30 minutes reviewing chart data, face-to-face evaluation with the patient, counseling and coordination of care as detailed above.   Patient expressed understanding and was in agreement with this plan. She also understands that She can call clinic  at any time with any questions, concerns, or complaints.   Cancer Staging Breast cancer, right [facility]) Staging form: Breast, AJCC 8th Edition - Clinical: Stage IV (cT4b, cN2, cM1, ER+, PR+, HER2-) - Signed by Lloyd Huger, MD on 05/17/2020   Lloyd Huger, MD   07/17/2020 10:36 AM

## 2020-07-16 ENCOUNTER — Ambulatory Visit: Payer: Medicare PPO

## 2020-07-16 ENCOUNTER — Other Ambulatory Visit: Payer: Medicare PPO

## 2020-07-16 ENCOUNTER — Ambulatory Visit: Payer: Medicare PPO | Admitting: Oncology

## 2020-07-17 ENCOUNTER — Inpatient Hospital Stay: Payer: Medicare PPO

## 2020-07-17 ENCOUNTER — Inpatient Hospital Stay: Payer: Medicare PPO | Attending: Oncology | Admitting: Oncology

## 2020-07-17 ENCOUNTER — Encounter: Payer: Self-pay | Admitting: Oncology

## 2020-07-17 ENCOUNTER — Other Ambulatory Visit: Payer: Self-pay

## 2020-07-17 VITALS — BP 139/62 | HR 64 | Temp 96.6°F | Resp 20 | Wt 168.2 lb

## 2020-07-17 DIAGNOSIS — C50911 Malignant neoplasm of unspecified site of right female breast: Secondary | ICD-10-CM | POA: Diagnosis not present

## 2020-07-17 DIAGNOSIS — C7951 Secondary malignant neoplasm of bone: Secondary | ICD-10-CM | POA: Insufficient documentation

## 2020-07-17 DIAGNOSIS — Z95828 Presence of other vascular implants and grafts: Secondary | ICD-10-CM

## 2020-07-17 DIAGNOSIS — Z170 Estrogen receptor positive status [ER+]: Secondary | ICD-10-CM

## 2020-07-17 LAB — BASIC METABOLIC PANEL
Anion gap: 7 (ref 5–15)
BUN: 12 mg/dL (ref 8–23)
CO2: 26 mmol/L (ref 22–32)
Calcium: 8.7 mg/dL — ABNORMAL LOW (ref 8.9–10.3)
Chloride: 107 mmol/L (ref 98–111)
Creatinine, Ser: 0.75 mg/dL (ref 0.44–1.00)
GFR, Estimated: >60 mL/min (ref >60)
Glucose, Bld: 118 mg/dL — ABNORMAL HIGH (ref 70–99)
Potassium: 3.9 mmol/L (ref 3.5–5.1)
Sodium: 140 mmol/L (ref 135–145)

## 2020-07-17 MED ORDER — HEPARIN SOD (PORK) LOCK FLUSH 100 UNIT/ML IV SOLN
500.0000 [IU] | Freq: Once | INTRAVENOUS | Status: AC | PRN
  Administered 2020-07-17: 500 [IU]
  Filled 2020-07-17: qty 5

## 2020-07-17 MED ORDER — HEPARIN SOD (PORK) LOCK FLUSH 100 UNIT/ML IV SOLN
500.0000 [IU] | Freq: Once | INTRAVENOUS | Status: DC
  Filled 2020-07-17: qty 5

## 2020-07-17 MED ORDER — ZOLEDRONIC ACID 4 MG/100ML IV SOLN
4.0000 mg | Freq: Once | INTRAVENOUS | Status: AC
  Administered 2020-07-17: 4 mg via INTRAVENOUS
  Filled 2020-07-17: qty 100

## 2020-07-17 MED ORDER — SODIUM CHLORIDE 0.9 % IV SOLN
Freq: Once | INTRAVENOUS | Status: AC
  Filled 2020-07-17: qty 250

## 2020-07-17 MED ORDER — SODIUM CHLORIDE 0.9% FLUSH
10.0000 mL | Freq: Once | INTRAVENOUS | Status: AC
  Administered 2020-07-17: 10 mL via INTRAVENOUS
  Filled 2020-07-17: qty 10

## 2020-07-17 MED ORDER — HEPARIN SOD (PORK) LOCK FLUSH 100 UNIT/ML IV SOLN
INTRAVENOUS | Status: AC
  Filled 2020-07-17: qty 5

## 2020-08-12 NOTE — Progress Notes (Signed)
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  INTERVAL HISTORY: Patient returns to clinic today for further evaluation and continuation of Zometa.  She continues to tolerate her treatments well without significant side effects.  She continues to remain anxious, but otherwise feels well.  She does not complain of pain. She has no neurologic complaints. She denies any recent fevers or illnesses.  She has a fair appetite, but denies weight loss.  She has no chest pain, shortness of breath, cough, or hemoptysis.  She denies any nausea, vomiting, constipation, or diarrhea.  She has no urinary complaints.  Patient offers no specific complaints today.  REVIEW OF SYSTEMS:   Review of Systems  Constitutional: Negative.  Negative for fever, malaise/fatigue and weight loss.  Respiratory: Negative.  Negative for cough, hemoptysis and shortness of breath.   Cardiovascular: Negative.  Negative for chest pain and leg swelling.  Gastrointestinal: Negative.  Negative for abdominal pain.  Genitourinary: Negative.  Negative for dysuria.  Musculoskeletal: Negative.  Negative for back pain.  Skin: Negative.  Negative for rash.  Neurological: Negative.  Negative for dizziness, focal weakness, weakness and headaches.  Psychiatric/Behavioral: The patient is nervous/anxious.     As per HPI. Otherwise, a complete review of systems is negative.  PAST MEDICAL HISTORY: Past Medical History:  Diagnosis Date  . Anxiety   . Arthritis   . Diet-controlled diabetes mellitus ([place])    borderline  . Fatigue   . HLD (hyperlipidemia)   . Invasive carcinoma of breast ([place])  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; ER/PR (+), HER2/neu (-)  . Metastasis to bone [facility])    Breast primary  . Murmur, cardiac   . Peripheral edema   . T wave inversion on electrocardiogram 05/17/2020   leads I, II, III, aVF, V3-V6    PAST SURGICAL HISTORY: Past Surgical History:  Procedure Laterality Date  . BREAST BIOPSY Right 05/02/2020   [place] Bx, Q clip, path pending  . BREAST BIOPSY Right 05/02/2020   [place] Axilla Bx, path pending  . CHOLECYSTECTOMY    . COLONOSCOPY    . LAPAROSCOPIC ABDOMINAL EXPLORATION    . MASTECTOMY MODIFIED RADICAL Right 05/23/2020   Procedure: MASTECTOMY MODIFIED RADICAL, WITH EXCISION OF AXILLARY CONTENTS;  Surgeon: Robert Bellow, MD;  Location: [facility] ORS;  Service: General;  Laterality: Right;  . PORTACATH PLACEMENT Left 05/23/2020   Procedure: INSERTION PORT-A-CATH;  Surgeon: Robert Bellow, MD;  Location: [facility] ORS;  Service: General;  Laterality: Left;    FAMILY HISTORY: Family History  Problem Relation Age of Onset  . Breast cancer Sister     ADVANCED DIRECTIVES (Y/N):  N  HEALTH MAINTENANCE: Social History   Tobacco Use  . Smoking status: Never Smoker  . Smokeless tobacco: Never Used  Vaping Use  . Vaping Use: Never used  Substance Use Topics  . Alcohol use: Never  . Drug use: Never     Colonoscopy:  PAP:  Bone density:  Lipid panel:  Allergies  Allergen Reactions  . Nickel     Current Outpatient Medications  Medication Sig Dispense Refill  . Calcium Carbonate-Vitamin D 600-400 MG-UNIT tablet Take by mouth.    . letrozole (FEMARA) 2.5 MG tablet  Take 1 tablet (2.5 mg total) by mouth daily. 90 tablet 3  . lidocaine-prilocaine (EMLA) cream Apply 1 application topically See admin instructions. APPLY A SMALL AMOUNT TO PORT SITE AT LEAST 1 HOUR PRIOR TO IT BEING ACCESSED THEN COVER WITH PLASTIC WRAP 30 g 3  . metoprolol succinate (TOPROL-XL) 25 MG 24 hr tablet Take 25 mg by mouth daily.    Marland Kitchen ALPRAZolam (XANAX) 0.25 MG tablet Take 1 tablet  (0.25 mg total) by mouth 2 (two) times daily as needed for anxiety. (Patient not taking: Reported on 07/17/2020) 60 tablet 0  . HYDROcodone-acetaminophen (NORCO/VICODIN) 5-325 MG tablet Take 1 tablet by mouth every 4 (four) hours as needed for moderate pain. (Patient not taking: Reported on 07/17/2020) 30 tablet 0   No current facility-administered medications for this visit.    OBJECTIVE: Vitals:   08/14/20 1028  BP: (!) 146/63  Pulse: 68  Resp: 20  Temp: 98 F (36.7 C)  SpO2: 100%     Body mass index is 30.75 kg/m.    ECOG FS:0 - Asymptomatic  General: Well-developed, well-nourished, no acute distress. Eyes: Pink conjunctiva, anicteric sclera. HEENT: Normocephalic, moist mucous membranes. Lungs: No audible wheezing or coughing. Heart: Regular rate and rhythm. Abdomen: Soft, nontender, no obvious distention. Musculoskeletal: No edema, cyanosis, or clubbing. Neuro: Alert, answering all questions appropriately. Cranial nerves grossly intact. Skin: No rashes or petechiae noted. Psych: Normal affect.  LAB RESULTS:  Lab Results  Component Value Date   NA 138 08/14/2020   K 4.0 08/14/2020   CL 103 08/14/2020   CO2 25 08/14/2020   GLUCOSE 107 (H) 08/14/2020   BUN 13 08/14/2020   CREATININE 0.98 08/14/2020   CALCIUM 9.3 08/14/2020   GFRNONAA >60 08/14/2020   GFRAA >60 06/18/2020    No results found for: WBC, NEUTROABS, HGB, HCT, MCV, PLT   STUDIES: No results found.  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  PLAN:    1. 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  Patient underwent  palliative mastectomy with bilateral lymph node sampling on May 23, 2020.  Continue letrozole indefinitely.  We previously discussed the option of adding in chemotherapy with oral Ibrance or possibly IV treatment, but patient is not interested at this time.  Proceed with Zometa today.  Return to clinic in 4 weeks for further evaluation and continuation of treatment. 2.  Hypocalcemia: Resolved. 3.  Anxiety: Continue Xanax as needed.   Patient expressed understanding and was in agreement with this plan. She also understands that She can call clinic at any time with any questions, concerns, or complaints.   Cancer Staging Breast cancer, right [facility]) Staging form: Breast, AJCC 8th Edition - Clinical: Stage IV (cT4b, cN2, cM1, ER+, PR+, HER2-) - Signed by Lloyd Huger, MD on 05/17/2020   Lloyd Huger, MD   08/14/2020 12:07 PM

## 2020-08-13 ENCOUNTER — Encounter: Payer: Self-pay | Admitting: Oncology

## 2020-08-14 ENCOUNTER — Encounter: Payer: Self-pay | Admitting: Oncology

## 2020-08-14 ENCOUNTER — Inpatient Hospital Stay: Payer: Medicare PPO

## 2020-08-14 ENCOUNTER — Inpatient Hospital Stay: Payer: Medicare PPO | Attending: Oncology

## 2020-08-14 ENCOUNTER — Inpatient Hospital Stay (HOSPITAL_BASED_OUTPATIENT_CLINIC_OR_DEPARTMENT_OTHER): Payer: Medicare PPO | Admitting: Oncology

## 2020-08-14 VITALS — BP 146/63 | HR 68 | Temp 98.0°F | Resp 20 | Wt 168.1 lb

## 2020-08-14 DIAGNOSIS — C50911 Malignant neoplasm of unspecified site of right female breast: Secondary | ICD-10-CM

## 2020-08-14 DIAGNOSIS — Z170 Estrogen receptor positive status [ER+]: Secondary | ICD-10-CM

## 2020-08-14 DIAGNOSIS — C7951 Secondary malignant neoplasm of bone: Secondary | ICD-10-CM | POA: Insufficient documentation

## 2020-08-14 DIAGNOSIS — Z79811 Long term (current) use of aromatase inhibitors: Secondary | ICD-10-CM | POA: Diagnosis not present

## 2020-08-14 LAB — BASIC METABOLIC PANEL
Anion gap: 10 (ref 5–15)
BUN: 13 mg/dL (ref 8–23)
CO2: 25 mmol/L (ref 22–32)
Calcium: 9.3 mg/dL (ref 8.9–10.3)
Chloride: 103 mmol/L (ref 98–111)
Creatinine, Ser: 0.98 mg/dL (ref 0.44–1.00)
GFR, Estimated: >60 mL/min (ref >60)
Glucose, Bld: 107 mg/dL — ABNORMAL HIGH (ref 70–99)
Potassium: 4 mmol/L (ref 3.5–5.1)
Sodium: 138 mmol/L (ref 135–145)

## 2020-08-14 MED ORDER — HEPARIN SOD (PORK) LOCK FLUSH 100 UNIT/ML IV SOLN
INTRAVENOUS | Status: AC
  Filled 2020-08-14: qty 5

## 2020-08-14 MED ORDER — SODIUM CHLORIDE 0.9 % IV SOLN
Freq: Once | INTRAVENOUS | Status: AC
  Filled 2020-08-14: qty 250

## 2020-08-14 MED ORDER — SODIUM CHLORIDE 0.9% FLUSH
10.0000 mL | Freq: Once | INTRAVENOUS | Status: AC
  Administered 2020-08-14: 10 mL via INTRAVENOUS
  Filled 2020-08-14: qty 10

## 2020-08-14 MED ORDER — HEPARIN SOD (PORK) LOCK FLUSH 100 UNIT/ML IV SOLN
500.0000 [IU] | Freq: Once | INTRAVENOUS | Status: AC | PRN
  Administered 2020-08-14: 500 [IU]
  Filled 2020-08-14: qty 5

## 2020-08-14 MED ORDER — ZOLEDRONIC ACID 4 MG/100ML IV SOLN
4.0000 mg | Freq: Once | INTRAVENOUS | Status: AC
  Administered 2020-08-14: 4 mg via INTRAVENOUS
  Filled 2020-08-14: qty 100

## 2020-08-14 NOTE — Addendum Note (Signed)
Addended by: Heinz Knuckles on: 08/14/2020 11:32 AM   Modules accepted: Orders

## 2020-08-14 NOTE — Progress Notes (Signed)
Pt tolerated infusion well with no signs of complications. VSS. Pt stable for discharge. Pt discharged home with son.  Jamari Moten [employer]

## 2020-08-15 LAB — CANCER ANTIGEN 27.29: CA 27.29: 69.6 U/mL — ABNORMAL HIGH (ref 0.0–38.6)

## 2020-09-08 NOTE — Progress Notes (Signed)
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  INTERVAL HISTORY: Patient returns to clinic today for further evaluation and continuation of Zometa.  She currently feels well and is asymptomatic.  She is tolerating letrozole well without significant side effects. She does not complain of pain. She has no neurologic complaints. She denies any recent fevers or illnesses.  She has a fair appetite, but denies weight loss.  She has no chest pain, shortness of breath, cough, or hemoptysis.  She denies any nausea, vomiting, constipation, or diarrhea.  She has no urinary complaints.  Patient offers no specific complaints today.  REVIEW OF SYSTEMS:   Review of Systems  Constitutional: Negative.  Negative for fever, malaise/fatigue and weight loss.  Respiratory: Negative.  Negative for cough, hemoptysis and shortness of breath.   Cardiovascular: Negative.  Negative for chest pain and leg swelling.  Gastrointestinal: Negative.  Negative for abdominal pain.  Genitourinary: Negative.  Negative for dysuria.  Musculoskeletal: Negative.  Negative for back pain.  Skin: Negative.  Negative for rash.  Neurological: Negative.  Negative for dizziness, focal weakness, weakness and headaches.  Psychiatric/Behavioral: The patient is nervous/anxious.     As per HPI. Otherwise, a complete review of systems is negative.  PAST MEDICAL HISTORY: Past Medical History:  Diagnosis Date  . Anxiety   . Arthritis   . Diet-controlled diabetes mellitus ([place])    borderline  . Fatigue   . HLD (hyperlipidemia)   . Invasive carcinoma of breast ([place]) 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; ER/PR  (+), HER2/neu (-)  . Metastasis to bone [facility])    Breast primary  . Murmur, cardiac   . Peripheral edema   . T wave inversion on electrocardiogram 05/17/2020   leads I, II, III, aVF, V3-V6    PAST SURGICAL HISTORY: Past Surgical History:  Procedure Laterality Date  . BREAST BIOPSY Right 05/02/2020   [place] Bx, Q clip, path pending  . BREAST BIOPSY Right 05/02/2020   [place] Axilla Bx, path pending  . CHOLECYSTECTOMY    . COLONOSCOPY    . LAPAROSCOPIC ABDOMINAL EXPLORATION    . MASTECTOMY MODIFIED RADICAL Right 05/23/2020   Procedure: MASTECTOMY MODIFIED RADICAL, WITH EXCISION OF AXILLARY CONTENTS;  Surgeon: Robert Bellow, MD;  Location: [facility] ORS;  Service: General;  Laterality: Right;  . PORTACATH PLACEMENT Left 05/23/2020   Procedure: INSERTION PORT-A-CATH;  Surgeon: Robert Bellow, MD;  Location: [facility] ORS;  Service: General;  Laterality: Left;    FAMILY HISTORY: Family History  Problem Relation Age of Onset  . Breast cancer Sister     ADVANCED DIRECTIVES (Y/N):  N  HEALTH MAINTENANCE: Social History   Tobacco Use  . Smoking status: Never Smoker  . Smokeless tobacco: Never Used  Vaping Use  . Vaping Use: Never used  Substance Use Topics  . Alcohol use: Never  . Drug use: Never     Colonoscopy:  PAP:  Bone density:  Lipid panel:  Allergies  Allergen Reactions  . Nickel     Current Outpatient Medications  Medication Sig Dispense Refill  . Calcium Carbonate-Vitamin D 600-400 MG-UNIT tablet Take by mouth.    . letrozole (FEMARA) 2.5 MG tablet Take 1 tablet (2.5 mg  total) by mouth daily. 90 tablet 3  . metoprolol succinate (TOPROL-XL) 25 MG 24 hr tablet Take 25 mg by mouth daily.    Marland Kitchen lidocaine-prilocaine (EMLA) cream Apply 1 application topically See admin instructions. APPLY A SMALL AMOUNT TO PORT SITE AT LEAST 1 HOUR PRIOR TO IT BEING ACCESSED THEN COVER WITH PLASTIC WRAP 30 g 3   No current facility-administered medications for this visit.     OBJECTIVE: Vitals:   09/11/20 1056  BP: (!) 165/71  Pulse: 82  Resp: 18  Temp: 97.9 F (36.6 C)  SpO2: 99%     Body mass index is 31.68 kg/m.    ECOG FS:0 - Asymptomatic  General: Well-developed, well-nourished, no acute distress. Eyes: Pink conjunctiva, anicteric sclera. HEENT: Normocephalic, moist mucous membranes. Lungs: No audible wheezing or coughing. Heart: Regular rate and rhythm. Abdomen: Soft, nontender, no obvious distention. Musculoskeletal: No edema, cyanosis, or clubbing. Neuro: Alert, answering all questions appropriately. Cranial nerves grossly intact. Skin: No rashes or petechiae noted. Psych: Normal affect.  LAB RESULTS:  Lab Results  Component Value Date   NA 138 09/11/2020   K 4.0 09/11/2020   CL 103 09/11/2020   CO2 27 09/11/2020   GLUCOSE 98 09/11/2020   BUN 9 09/11/2020   CREATININE 0.83 09/11/2020   CALCIUM 9.1 09/11/2020   GFRNONAA >60 09/11/2020   GFRAA >60 06/18/2020    No results found for: WBC, NEUTROABS, HGB, HCT, MCV, PLT   STUDIES: No results found.  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  PLAN:    1. 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  Patient underwent palliative mastectomy with bilateral lymph node sampling on May 23, 2020.  Patient's most recent CA 27-29 is 69.6.  Continue letrozole indefinitely.  We previously discussed the option of adding in chemotherapy with oral Ibrance or possibly IV treatment, but patient is not interested at this time.  Patient initiated monthly Zometa on June 18, 2020.  Continue this for 1 year and then transition her  to treatment every 3 months.  Proceed with Zometa today.  Return to clinic in 4 weeks for further evaluation and continuation of treatment.   2.  Hypocalcemia: Resolved. 3.  Anxiety: Continue Xanax as needed.   I spent a total of 30 minutes reviewing chart data, face-to-face evaluation with the patient, counseling and coordination of care as detailed above.   Patient expressed understanding and was in agreement with this plan. She also understands that She can call clinic at any time with any questions, concerns, or complaints.   Cancer Staging Breast cancer, right [facility]) Staging form: Breast, AJCC 8th Edition - Clinical: Stage IV (cT4b, cN2, cM1, ER+, PR+, HER2-) - Signed by Lloyd Huger, MD on 05/17/2020   Lloyd Huger, MD   09/12/2020 2:59 PM

## 2020-09-11 ENCOUNTER — Inpatient Hospital Stay (HOSPITAL_BASED_OUTPATIENT_CLINIC_OR_DEPARTMENT_OTHER): Payer: Medicare PPO | Admitting: Oncology

## 2020-09-11 ENCOUNTER — Encounter: Payer: Self-pay | Admitting: Oncology

## 2020-09-11 ENCOUNTER — Inpatient Hospital Stay: Payer: Medicare PPO

## 2020-09-11 ENCOUNTER — Other Ambulatory Visit: Payer: Self-pay

## 2020-09-11 ENCOUNTER — Inpatient Hospital Stay: Payer: Medicare PPO | Attending: Oncology

## 2020-09-11 VITALS — BP 165/71 | HR 82 | Temp 97.9°F | Resp 18 | Wt 173.2 lb

## 2020-09-11 DIAGNOSIS — Z79899 Other long term (current) drug therapy: Secondary | ICD-10-CM | POA: Insufficient documentation

## 2020-09-11 DIAGNOSIS — C50911 Malignant neoplasm of unspecified site of right female breast: Secondary | ICD-10-CM

## 2020-09-11 DIAGNOSIS — C7951 Secondary malignant neoplasm of bone: Secondary | ICD-10-CM | POA: Diagnosis present

## 2020-09-11 DIAGNOSIS — Z170 Estrogen receptor positive status [ER+]: Secondary | ICD-10-CM

## 2020-09-11 DIAGNOSIS — Z79811 Long term (current) use of aromatase inhibitors: Secondary | ICD-10-CM | POA: Diagnosis not present

## 2020-09-11 LAB — BASIC METABOLIC PANEL
Anion gap: 8 (ref 5–15)
BUN: 9 mg/dL (ref 8–23)
CO2: 27 mmol/L (ref 22–32)
Calcium: 9.1 mg/dL (ref 8.9–10.3)
Chloride: 103 mmol/L (ref 98–111)
Creatinine, Ser: 0.83 mg/dL (ref 0.44–1.00)
GFR, Estimated: >60 mL/min (ref >60)
Glucose, Bld: 98 mg/dL (ref 70–99)
Potassium: 4 mmol/L (ref 3.5–5.1)
Sodium: 138 mmol/L (ref 135–145)

## 2020-09-11 MED ORDER — SODIUM CHLORIDE 0.9% FLUSH
10.0000 mL | Freq: Once | INTRAVENOUS | Status: AC
  Administered 2020-09-11: 10 mL via INTRAVENOUS
  Filled 2020-09-11: qty 10

## 2020-09-11 MED ORDER — HEPARIN SOD (PORK) LOCK FLUSH 100 UNIT/ML IV SOLN
INTRAVENOUS | Status: AC
  Filled 2020-09-11: qty 5

## 2020-09-11 MED ORDER — SODIUM CHLORIDE 0.9 % IV SOLN
Freq: Once | INTRAVENOUS | Status: AC
  Filled 2020-09-11: qty 250

## 2020-09-11 MED ORDER — ZOLEDRONIC ACID 4 MG/100ML IV SOLN
4.0000 mg | Freq: Once | INTRAVENOUS | Status: AC
  Administered 2020-09-11: 4 mg via INTRAVENOUS
  Filled 2020-09-11: qty 100

## 2020-09-11 NOTE — Progress Notes (Signed)
Pt tolerated infusion well with no signs of complications. VSS. Pt stable for discharge. Pt discharged home with son.   Kristy York [employer]

## 2020-09-13 LAB — CANCER ANTIGEN 27.29: CA 27.29: 61.2 U/mL — ABNORMAL HIGH (ref 0.0–38.6)

## 2020-10-07 NOTE — Progress Notes (Signed)
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  INTERVAL HISTORY: Patient returns to clinic today for further evaluation and continuation of Zometa.  She continues to tolerate letrozole well without significant side effects.  She currently feels well and is asymptomatic.  She does not complain of pain. She has no neurologic complaints. She denies any recent fevers or illnesses.  She has a fair appetite, but denies weight loss.  She has no chest pain, shortness of breath, cough, or hemoptysis.  She denies any nausea, vomiting, constipation, or diarrhea.  She has no urinary complaints.  Patient offers no specific complaints today.  REVIEW OF SYSTEMS:   Review of Systems  Constitutional: Negative.  Negative for fever, malaise/fatigue and weight loss.  Respiratory: Negative.  Negative for cough, hemoptysis and shortness of breath.   Cardiovascular: Negative.  Negative for chest pain and leg swelling.  Gastrointestinal: Negative.  Negative for abdominal pain.  Genitourinary: Negative.  Negative for dysuria.  Musculoskeletal: Negative.  Negative for back pain.  Skin: Negative.  Negative for rash.  Neurological: Negative.  Negative for dizziness, focal weakness, weakness and headaches.  Psychiatric/Behavioral: The patient is nervous/anxious.     As per HPI. Otherwise, a complete review of systems is negative.  PAST MEDICAL HISTORY: Past Medical History:  Diagnosis Date  . Anxiety   . Arthritis   . Diet-controlled diabetes mellitus ([place])    borderline  . Fatigue   . HLD (hyperlipidemia)   . Invasive carcinoma of breast ([place]) 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;  ER/PR (+), HER2/neu (-)  . Metastasis to bone [facility])    Breast primary  . Murmur, cardiac   . Peripheral edema   . T wave inversion on electrocardiogram 05/17/2020   leads I, II, III, aVF, V3-V6    PAST SURGICAL HISTORY: Past Surgical History:  Procedure Laterality Date  . BREAST BIOPSY Right 05/02/2020   [place] Bx, Q clip, path pending  . BREAST BIOPSY Right 05/02/2020   [place] Axilla Bx, path pending  . CHOLECYSTECTOMY    . COLONOSCOPY    . LAPAROSCOPIC ABDOMINAL EXPLORATION    . MASTECTOMY MODIFIED RADICAL Right 05/23/2020   Procedure: MASTECTOMY MODIFIED RADICAL, WITH EXCISION OF AXILLARY CONTENTS;  Surgeon: Robert Bellow, MD;  Location: [facility] ORS;  Service: General;  Laterality: Right;  . PORTACATH PLACEMENT Left 05/23/2020   Procedure: INSERTION PORT-A-CATH;  Surgeon: Robert Bellow, MD;  Location: [facility] ORS;  Service: General;  Laterality: Left;    FAMILY HISTORY: Family History  Problem Relation Age of Onset  . Breast cancer Sister     ADVANCED DIRECTIVES (Y/N):  N  HEALTH MAINTENANCE: Social History   Tobacco Use  . Smoking status: Never Smoker  . Smokeless tobacco: Never Used  Vaping Use  . Vaping Use: Never used  Substance Use Topics  . Alcohol use: Never  . Drug use: Never     Colonoscopy:  PAP:  Bone density:  Lipid panel:  Allergies  Allergen Reactions  . Nickel     Current Outpatient Medications  Medication Sig Dispense Refill  . Calcium Carbonate-Vitamin D 600-400 MG-UNIT tablet Take by mouth.    . letrozole (FEMARA) 2.5 MG tablet Take 1 tablet (  2.5 mg total) by mouth daily. 90 tablet 3  . lidocaine-prilocaine (EMLA) cream Apply 1 application topically See admin instructions. APPLY A SMALL AMOUNT TO PORT SITE AT LEAST 1 HOUR PRIOR TO IT BEING ACCESSED THEN COVER WITH PLASTIC WRAP 30 g 3  . metoprolol succinate (TOPROL-XL) 25 MG 24 hr tablet Take 25 mg by mouth daily.     No current facility-administered medications for this visit.     OBJECTIVE: Vitals:   10/09/20 1327  BP: (!) 142/64  Pulse: 78  Temp: (!) 97.5 F (36.4 C)  SpO2: 100%     Body mass index is 30.77 kg/m.    ECOG FS:0 - Asymptomatic  General: Well-developed, well-nourished, no acute distress. Eyes: Pink conjunctiva, anicteric sclera. HEENT: Normocephalic, moist mucous membranes. Lungs: No audible wheezing or coughing. Heart: Regular rate and rhythm. Abdomen: Soft, nontender, no obvious distention. Musculoskeletal: No edema, cyanosis, or clubbing. Neuro: Alert, answering all questions appropriately. Cranial nerves grossly intact. Skin: No rashes or petechiae noted. Psych: Normal affect.  LAB RESULTS:  Lab Results  Component Value Date   NA 138 10/09/2020   K 3.8 10/09/2020   CL 104 10/09/2020   CO2 24 10/09/2020   GLUCOSE 115 (H) 10/09/2020   BUN 11 10/09/2020   CREATININE 0.88 10/09/2020   CALCIUM 8.8 (L) 10/09/2020   GFRNONAA >60 10/09/2020   GFRAA >60 06/18/2020    No results found for: WBC, NEUTROABS, HGB, HCT, MCV, PLT   STUDIES: No results found.  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  PLAN:    1. 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  Patient underwent palliative mastectomy with bilateral lymph node sampling on May 23, 2020.  Patient's most recent CA 27-29 is 59.6.  Continue letrozole indefinitely.  We previously discussed the option of adding in chemotherapy with oral Ibrance or possibly IV treatment, but patient is not interested at this time.  Patient initiated monthly Zometa on June 18, 2020.  Continue this for 1 year and then transition  her to treatment every 3 months.  Proceed with Zometa today.  Return to clinic in 4 weeks for further evaluation and continuation of treatment. 2.  Hypocalcemia: Mild.  Proceed with Zometa as above. 3.  Anxiety: Continue Xanax as needed.  I spent a total of 30 minutes reviewing chart data, face-to-face evaluation with the patient, counseling and coordination of care as detailed above.    Patient expressed understanding and was in agreement with this plan. She also understands that She can call clinic at any time with any questions, concerns, or complaints.   Cancer Staging Breast cancer, right [facility]) Staging form: Breast, AJCC 8th Edition - Clinical: Stage IV (cT4b, cN2, cM1, ER+, PR+, HER2-) - Signed by Lloyd Huger, MD on 05/17/2020   Lloyd Huger, MD   10/10/2020 6:31 AM

## 2020-10-09 ENCOUNTER — Inpatient Hospital Stay (HOSPITAL_BASED_OUTPATIENT_CLINIC_OR_DEPARTMENT_OTHER): Payer: Medicare PPO | Admitting: Oncology

## 2020-10-09 ENCOUNTER — Other Ambulatory Visit: Payer: Self-pay

## 2020-10-09 ENCOUNTER — Inpatient Hospital Stay: Payer: Medicare PPO | Attending: Oncology

## 2020-10-09 ENCOUNTER — Inpatient Hospital Stay: Payer: Medicare PPO

## 2020-10-09 VITALS — BP 142/64 | HR 78 | Temp 97.5°F | Wt 168.2 lb

## 2020-10-09 DIAGNOSIS — C50911 Malignant neoplasm of unspecified site of right female breast: Secondary | ICD-10-CM | POA: Insufficient documentation

## 2020-10-09 DIAGNOSIS — Z79899 Other long term (current) drug therapy: Secondary | ICD-10-CM | POA: Insufficient documentation

## 2020-10-09 DIAGNOSIS — C7951 Secondary malignant neoplasm of bone: Secondary | ICD-10-CM | POA: Diagnosis present

## 2020-10-09 DIAGNOSIS — Z170 Estrogen receptor positive status [ER+]: Secondary | ICD-10-CM | POA: Diagnosis not present

## 2020-10-09 LAB — BASIC METABOLIC PANEL
Anion gap: 10 (ref 5–15)
BUN: 11 mg/dL (ref 8–23)
CO2: 24 mmol/L (ref 22–32)
Calcium: 8.8 mg/dL — ABNORMAL LOW (ref 8.9–10.3)
Chloride: 104 mmol/L (ref 98–111)
Creatinine, Ser: 0.88 mg/dL (ref 0.44–1.00)
GFR, Estimated: >60 mL/min (ref >60)
Glucose, Bld: 115 mg/dL — ABNORMAL HIGH (ref 70–99)
Potassium: 3.8 mmol/L (ref 3.5–5.1)
Sodium: 138 mmol/L (ref 135–145)

## 2020-10-09 MED ORDER — HEPARIN SOD (PORK) LOCK FLUSH 100 UNIT/ML IV SOLN
500.0000 [IU] | Freq: Once | INTRAVENOUS | Status: AC | PRN
  Administered 2020-10-09: 500 [IU]
  Filled 2020-10-09: qty 5

## 2020-10-09 MED ORDER — ZOLEDRONIC ACID 4 MG/100ML IV SOLN
4.0000 mg | Freq: Once | INTRAVENOUS | Status: AC
Start: 2020-10-09 — End: 2020-10-09
  Administered 2020-10-09: 4 mg via INTRAVENOUS
  Filled 2020-10-09: qty 100

## 2020-10-09 NOTE — Progress Notes (Signed)
Stable at discharge 

## 2020-10-10 LAB — CANCER ANTIGEN 27.29: CA 27.29: 59.6 U/mL — ABNORMAL HIGH (ref 0.0–38.6)

## 2020-11-02 NOTE — Progress Notes (Signed)
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  INTERVAL HISTORY: Patient returns to clinic today for further evaluation and continuation of Zometa.  She continues to feel well and remains asymptomatic.  She is tolerating letrozole without significant side effects.  She does not complain of pain. She has no neurologic complaints. She denies any recent fevers or illnesses.  She has a fair appetite, but denies weight loss.  She has no chest pain, shortness of breath, cough, or hemoptysis.  She denies any nausea, vomiting, constipation, or diarrhea.  She has no urinary complaints.  Patient offers no specific complaints today.  REVIEW OF SYSTEMS:   Review of Systems  Constitutional: Negative.  Negative for fever, malaise/fatigue and weight loss.  Respiratory: Negative.  Negative for cough, hemoptysis and shortness of breath.   Cardiovascular: Negative.  Negative for chest pain and leg swelling.  Gastrointestinal: Negative.  Negative for abdominal pain.  Genitourinary: Negative.  Negative for dysuria.  Musculoskeletal: Negative.  Negative for back pain.  Skin: Negative.  Negative for rash.  Neurological: Negative.  Negative for dizziness, focal weakness, weakness and headaches.  Psychiatric/Behavioral: Negative.  The patient is not nervous/anxious.     As per HPI. Otherwise, a complete review of systems is negative.  PAST MEDICAL HISTORY: Past Medical History:  Diagnosis Date  . Anxiety   . Arthritis   . Diet-controlled diabetes mellitus ([place])    borderline  . Fatigue   . HLD (hyperlipidemia)   . Invasive carcinoma of breast ([place]) 05/02/2020    Stage IV; ER/PR (+), HER2/neu (-)  . Metastasis to bone [facility])    Breast primary  . Murmur, cardiac   . Peripheral edema   . T wave inversion on electrocardiogram 05/17/2020   leads I, II, III, aVF, V3-V6    PAST SURGICAL HISTORY: Past Surgical History:  Procedure Laterality Date  . BREAST BIOPSY Right 05/02/2020   [place] Bx, Q clip, path pending  . BREAST BIOPSY Right 05/02/2020   [place] Axilla Bx, path pending  . CHOLECYSTECTOMY    . COLONOSCOPY    . LAPAROSCOPIC ABDOMINAL EXPLORATION    . MASTECTOMY MODIFIED RADICAL Right 05/23/2020   Procedure: MASTECTOMY MODIFIED RADICAL, WITH EXCISION OF AXILLARY CONTENTS;  Surgeon: Robert Bellow, MD;  Location: [facility] ORS;  Service: General;  Laterality: Right;  . PORTACATH PLACEMENT Left 05/23/2020   Procedure: INSERTION PORT-A-CATH;  Surgeon: Robert Bellow, MD;  Location: [facility] ORS;  Service: General;  Laterality: Left;    FAMILY HISTORY: Family History  Problem Relation Age of Onset  . Breast cancer Sister     ADVANCED DIRECTIVES (Y/N):  N  HEALTH MAINTENANCE: Social History   Tobacco Use  . Smoking status: Never Smoker  . Smokeless tobacco: Never Used  Vaping Use  . Vaping Use: Never used  Substance Use Topics  . Alcohol use: Never  . Drug use: Never     Colonoscopy:  PAP:  Bone density:  Lipid panel:  Allergies  Allergen Reactions  . Nickel     Current Outpatient Medications  Medication Sig Dispense Refill  . Calcium Carbonate-Vitamin D 600-400 MG-UNIT tablet Take by mouth.    . letrozole (FEMARA) 2.5 MG tablet Take  1 tablet (2.5 mg total) by mouth daily. 90 tablet 3  . lidocaine-prilocaine (EMLA) cream Apply 1 application topically See admin instructions. APPLY A SMALL AMOUNT TO PORT SITE AT LEAST 1 HOUR PRIOR TO IT BEING ACCESSED THEN COVER WITH PLASTIC WRAP 30 g 3  . metoprolol succinate (TOPROL-XL) 25 MG 24 hr tablet Take 25 mg by mouth daily.     No current facility-administered medications for this visit.     OBJECTIVE: Vitals:   11/06/20 1352  BP: (!) 149/71  Pulse: 72  Resp: 18  Temp: 97.7 F (36.5 C)     Body mass index is 31.92 kg/m.    ECOG FS:0 - Asymptomatic  General: Well-developed, well-nourished, no acute distress. Eyes: Pink conjunctiva, anicteric sclera. HEENT: Normocephalic, moist mucous membranes. Lungs: No audible wheezing or coughing. Heart: Regular rate and rhythm. Abdomen: Soft, nontender, no obvious distention. Musculoskeletal: No edema, cyanosis, or clubbing. Neuro: Alert, answering all questions appropriately. Cranial nerves grossly intact. Skin: No rashes or petechiae noted. Psych: Normal affect.  LAB RESULTS:  Lab Results  Component Value Date   NA 138 11/06/2020   K 4.1 11/06/2020   CL 101 11/06/2020   CO2 26 11/06/2020   GLUCOSE 99 11/06/2020   BUN 18 11/06/2020   CREATININE 0.74 11/06/2020   CALCIUM 9.4 11/06/2020   GFRNONAA >60 11/06/2020   GFRAA >60 06/18/2020    No results found for: WBC, NEUTROABS, HGB, HCT, MCV, PLT   STUDIES: No results found.  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  PLAN:    1. 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  Patient underwent palliative mastectomy with bilateral lymph node sampling on May 23, 2020.  Patient's most recent CA 27-29 is 59.6, today's result is pending.  Continue letrozole indefinitely.  We previously discussed the option of adding in chemotherapy with oral Ibrance or possibly IV treatment, but patient is not interested at this time.  Patient initiated monthly Zometa on June 18, 2020.  Continue this for 1 year and then  transition her to treatment every 3 months.  Proceed with Zometa today.  Return to clinic in 4 weeks for further evaluation and continuation of treatment.  2.  Hypocalcemia: Resolved. 3.  Anxiety: Chronic and unchanged.  Continue Xanax as needed. 4.  Genetic testing: Patient has declined.    Patient expressed understanding and was in agreement with this plan. She also understands that She can call clinic at any time with any questions, concerns, or complaints.   Cancer Staging Breast cancer, right [facility]) Staging form: Breast, AJCC 8th Edition - Clinical: Stage IV (cT4b, cN2, cM1, ER+, PR+, HER2-) - Signed by Lloyd Huger, MD on 05/17/2020 Stage prefix: Initial diagnosis   Lloyd Huger, MD   11/07/2020 6:37 AM

## 2020-11-06 ENCOUNTER — Encounter: Payer: Self-pay | Admitting: Oncology

## 2020-11-06 ENCOUNTER — Other Ambulatory Visit: Payer: Self-pay

## 2020-11-06 ENCOUNTER — Inpatient Hospital Stay: Payer: Medicare PPO

## 2020-11-06 ENCOUNTER — Inpatient Hospital Stay: Payer: Medicare PPO | Attending: Oncology

## 2020-11-06 ENCOUNTER — Inpatient Hospital Stay (HOSPITAL_BASED_OUTPATIENT_CLINIC_OR_DEPARTMENT_OTHER): Payer: Medicare PPO | Admitting: Oncology

## 2020-11-06 VITALS — BP 149/71 | HR 72 | Temp 97.7°F | Resp 18 | Wt 174.5 lb

## 2020-11-06 DIAGNOSIS — Z79811 Long term (current) use of aromatase inhibitors: Secondary | ICD-10-CM | POA: Insufficient documentation

## 2020-11-06 DIAGNOSIS — C7951 Secondary malignant neoplasm of bone: Secondary | ICD-10-CM | POA: Insufficient documentation

## 2020-11-06 DIAGNOSIS — Z79899 Other long term (current) drug therapy: Secondary | ICD-10-CM | POA: Diagnosis not present

## 2020-11-06 DIAGNOSIS — C50911 Malignant neoplasm of unspecified site of right female breast: Secondary | ICD-10-CM

## 2020-11-06 DIAGNOSIS — Z170 Estrogen receptor positive status [ER+]: Secondary | ICD-10-CM | POA: Insufficient documentation

## 2020-11-06 LAB — BASIC METABOLIC PANEL
Anion gap: 11 (ref 5–15)
BUN: 18 mg/dL (ref 8–23)
CO2: 26 mmol/L (ref 22–32)
Calcium: 9.4 mg/dL (ref 8.9–10.3)
Chloride: 101 mmol/L (ref 98–111)
Creatinine, Ser: 0.74 mg/dL (ref 0.44–1.00)
GFR, Estimated: >60 mL/min (ref >60)
Glucose, Bld: 99 mg/dL (ref 70–99)
Potassium: 4.1 mmol/L (ref 3.5–5.1)
Sodium: 138 mmol/L (ref 135–145)

## 2020-11-06 MED ORDER — SODIUM CHLORIDE 0.9% FLUSH
10.0000 mL | Freq: Once | INTRAVENOUS | Status: AC
  Administered 2020-11-06: 10 mL via INTRAVENOUS
  Filled 2020-11-06: qty 10

## 2020-11-06 MED ORDER — SODIUM CHLORIDE 0.9 % IV SOLN
Freq: Once | INTRAVENOUS | Status: AC
  Filled 2020-11-06: qty 250

## 2020-11-06 MED ORDER — ZOLEDRONIC ACID 4 MG/100ML IV SOLN
4.0000 mg | Freq: Once | INTRAVENOUS | Status: AC
  Administered 2020-11-06: 4 mg via INTRAVENOUS
  Filled 2020-11-06: qty 100

## 2020-11-06 MED ORDER — HEPARIN SOD (PORK) LOCK FLUSH 100 UNIT/ML IV SOLN
500.0000 [IU] | Freq: Once | INTRAVENOUS | Status: AC
  Administered 2020-11-06: 500 [IU] via INTRAVENOUS
  Filled 2020-11-06: qty 5

## 2020-11-06 MED ORDER — HEPARIN SOD (PORK) LOCK FLUSH 100 UNIT/ML IV SOLN
INTRAVENOUS | Status: AC
  Filled 2020-11-06: qty 5

## 2020-11-06 NOTE — Progress Notes (Signed)
Patient tolerated infusion well. Discharged home.  

## 2020-11-06 NOTE — Progress Notes (Unsigned)
Pt and son in for follow up.  Pt denies any concerns today.

## 2020-11-07 LAB — CANCER ANTIGEN 27.29: CA 27.29: 68 U/mL — ABNORMAL HIGH (ref 0.0–38.6)

## 2020-12-01 NOTE — Progress Notes (Signed)
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  INTERVAL HISTORY: Patient returns to clinic today for further evaluation and continuation of Zometa. She continues to feel well and remains asymptomatic. She is tolerating letrozole without significant side effects. She has left wrist pain from arthritis, but no other complaints. She has no neurologic complaints. She denies any recent fevers or illnesses.  She has a fair appetite, but denies weight loss.  She has no chest pain, shortness of breath, cough, or hemoptysis.  She denies any nausea, vomiting, constipation, or diarrhea.  She has no urinary complaints. [name]  REVIEW OF SYSTEMS:   Review of Systems  Constitutional: Negative.  Negative for fever, malaise/fatigue and weight loss.  Respiratory: Negative.  Negative for cough, hemoptysis and shortness of breath.   Cardiovascular: Negative.  Negative for chest pain and leg swelling.  Gastrointestinal: Negative.  Negative for abdominal pain.  Genitourinary: Negative.  Negative for dysuria.  Musculoskeletal: Positive for joint pain. Negative for back pain.  Skin: Negative.  Negative for rash.  Neurological: Negative.  Negative for dizziness, focal weakness, weakness and headaches.  Psychiatric/Behavioral: Negative.  The patient is not nervous/anxious.     As per HPI. Otherwise, a complete review of systems is negative.  PAST MEDICAL HISTORY: Past Medical History:  Diagnosis Date  . Anxiety   . Arthritis   . Diet-controlled diabetes mellitus ([place])    borderline  . Fatigue   . HLD (hyperlipidemia)    . Invasive carcinoma of breast ([place]) 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; ER/PR (+), HER2/neu (-)  . Metastasis to bone [facility])    Breast primary  . Murmur, cardiac   . Peripheral edema   . T wave inversion on electrocardiogram 05/17/2020   leads I, II, III, aVF, V3-V6    PAST SURGICAL HISTORY: Past Surgical History:  Procedure Laterality Date  . BREAST BIOPSY Right 05/02/2020   [place] Bx, Q clip, path pending  . BREAST BIOPSY Right 05/02/2020   [place] Axilla Bx, path pending  . CHOLECYSTECTOMY    . COLONOSCOPY    . LAPAROSCOPIC ABDOMINAL EXPLORATION    . MASTECTOMY MODIFIED RADICAL Right 05/23/2020   Procedure: MASTECTOMY MODIFIED RADICAL, WITH EXCISION OF AXILLARY CONTENTS;  Surgeon: Robert Bellow, MD;  Location: [facility] ORS;  Service: General;  Laterality: Right;  . PORTACATH PLACEMENT Left 05/23/2020   Procedure: INSERTION PORT-A-CATH;  Surgeon: Robert Bellow, MD;  Location: [facility] ORS;  Service: General;  Laterality: Left;    FAMILY HISTORY: Family History  Problem Relation Age of Onset  . Breast cancer Sister     ADVANCED DIRECTIVES (Y/N):  N  HEALTH MAINTENANCE: Social History   Tobacco Use  . Smoking status: Never Smoker  . Smokeless tobacco: Never Used  Vaping Use  . Vaping Use: Never used  Substance Use Topics  . Alcohol use: Never  . Drug use: Never     Colonoscopy:  PAP:  Bone density:  Lipid panel:  Allergies  Allergen Reactions  . Nickel     Current Outpatient Medications  Medication Sig Dispense Refill  . Calcium Carbonate-Vitamin D 600-400 MG-UNIT tablet Take by mouth.    . letrozole [facility]  2.5 MG tablet Take 1 tablet (2.5 mg total) by mouth daily. 90 tablet 3  . lidocaine-prilocaine (EMLA) cream Apply 1 application topically See admin instructions. APPLY A SMALL AMOUNT TO PORT SITE AT LEAST 1 HOUR PRIOR TO IT BEING ACCESSED THEN COVER WITH PLASTIC WRAP 30 g 3  . metoprolol succinate (TOPROL-XL) 25 MG 24 hr tablet Take 25 mg by mouth daily.     No  current facility-administered medications for this visit.    OBJECTIVE: Vitals:   12/04/20 1028  BP: (!) 151/79  Pulse: 79  Temp: 98.6 F (37 C)  SpO2: 100%     Body mass index is 32.74 kg/m.    ECOG FS:0 - Asymptomatic  General: Well-developed, well-nourished, no acute distress. Eyes: Pink conjunctiva, anicteric sclera. HEENT: Normocephalic, moist mucous membranes. Lungs: No audible wheezing or coughing. Heart: Regular rate and rhythm. Abdomen: Soft, nontender, no obvious distention. Musculoskeletal: No edema, cyanosis, or clubbing. Neuro: Alert, answering all questions appropriately. Cranial nerves grossly intact. Skin: No rashes or petechiae noted. Psych: Normal affect.  LAB RESULTS:  Lab Results  Component Value Date   NA 139 12/04/2020   K 3.7 12/04/2020   CL 103 12/04/2020   CO2 24 12/04/2020   GLUCOSE 120 (H) 12/04/2020   BUN 12 12/04/2020   CREATININE 0.83 12/04/2020   CALCIUM 8.9 12/04/2020   GFRNONAA >60 12/04/2020   GFRAA >60 06/18/2020    No results found for: WBC, NEUTROABS, HGB, HCT, MCV, PLT   STUDIES: No results found.  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  PLAN:    1. 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  Patient underwent palliative mastectomy with bilateral lymph node sampling on May 23, 2020. Patient's CA 27-29 continues to trend down and is now 51.0.  Continue letrozole indefinitely.  We previously discussed the option of adding in chemotherapy with oral Ibrance or possibly IV treatment, but patient is not interested at this time.  Patient initiated monthly Zometa on  June 18, 2020.  Continue this for 1 year and then transition her to treatment every 3 months. Proceed with Zometa today. Return to clinic in 4 weeks for further evaluation and continuation of treatment. 2.  Hypocalcemia: Resolved. 3.  Anxiety: Chronic and unchanged. Continue Xanax as needed. 4.  Genetic testing: Patient has declined.  I spent a total of 30 minutes reviewing chart data, face-to-face evaluation with the patient, counseling and coordination of care as detailed above.   Patient expressed understanding and was in agreement with this plan. She also understands that She can call clinic at any time with any questions, concerns, or complaints.   Cancer Staging Breast cancer, right [facility]) Staging form: Breast, AJCC 8th Edition - Clinical: Stage IV (cT4b, cN2, cM1, ER+, PR+, HER2-) - Signed by Lloyd Huger, MD on 05/17/2020 Stage prefix: Initial diagnosis   Lloyd Huger, MD   12/05/2020 6:05 AM

## 2020-12-04 ENCOUNTER — Inpatient Hospital Stay: Payer: Medicare PPO

## 2020-12-04 ENCOUNTER — Inpatient Hospital Stay: Payer: Medicare PPO | Attending: Oncology

## 2020-12-04 ENCOUNTER — Inpatient Hospital Stay (HOSPITAL_BASED_OUTPATIENT_CLINIC_OR_DEPARTMENT_OTHER): Payer: Medicare PPO | Admitting: Oncology

## 2020-12-04 VITALS — BP 151/79 | HR 79 | Temp 98.6°F | Wt 179.0 lb

## 2020-12-04 DIAGNOSIS — C50911 Malignant neoplasm of unspecified site of right female breast: Secondary | ICD-10-CM

## 2020-12-04 DIAGNOSIS — C7951 Secondary malignant neoplasm of bone: Secondary | ICD-10-CM | POA: Diagnosis not present

## 2020-12-04 DIAGNOSIS — Z79811 Long term (current) use of aromatase inhibitors: Secondary | ICD-10-CM | POA: Diagnosis not present

## 2020-12-04 DIAGNOSIS — Z170 Estrogen receptor positive status [ER+]: Secondary | ICD-10-CM | POA: Insufficient documentation

## 2020-12-04 DIAGNOSIS — Z9011 Acquired absence of right breast and nipple: Secondary | ICD-10-CM | POA: Insufficient documentation

## 2020-12-04 DIAGNOSIS — I972 Postmastectomy lymphedema syndrome: Secondary | ICD-10-CM | POA: Insufficient documentation

## 2020-12-04 LAB — BASIC METABOLIC PANEL
Anion gap: 12 (ref 5–15)
BUN: 12 mg/dL (ref 8–23)
CO2: 24 mmol/L (ref 22–32)
Calcium: 8.9 mg/dL (ref 8.9–10.3)
Chloride: 103 mmol/L (ref 98–111)
Creatinine, Ser: 0.83 mg/dL (ref 0.44–1.00)
GFR, Estimated: >60 mL/min (ref >60)
Glucose, Bld: 120 mg/dL — ABNORMAL HIGH (ref 70–99)
Potassium: 3.7 mmol/L (ref 3.5–5.1)
Sodium: 139 mmol/L (ref 135–145)

## 2020-12-04 MED ORDER — HEPARIN SOD (PORK) LOCK FLUSH 100 UNIT/ML IV SOLN
500.0000 [IU] | Freq: Once | INTRAVENOUS | Status: AC
  Administered 2020-12-04: 500 [IU] via INTRAVENOUS
  Filled 2020-12-04: qty 5

## 2020-12-04 MED ORDER — ZOLEDRONIC ACID 4 MG/100ML IV SOLN
4.0000 mg | Freq: Once | INTRAVENOUS | Status: AC
  Administered 2020-12-04: 4 mg via INTRAVENOUS
  Filled 2020-12-04: qty 100

## 2020-12-04 MED ORDER — SODIUM CHLORIDE 0.9 % IV SOLN
Freq: Once | INTRAVENOUS | Status: AC
  Filled 2020-12-04: qty 250

## 2020-12-04 MED ORDER — SODIUM CHLORIDE 0.9% FLUSH
10.0000 mL | Freq: Once | INTRAVENOUS | Status: AC
  Administered 2020-12-04: 10 mL via INTRAVENOUS
  Filled 2020-12-04: qty 10

## 2020-12-04 NOTE — Progress Notes (Signed)
Patient here for oncology follow-up appointment, expresses concerns of wrist discomfort

## 2020-12-05 LAB — CANCER ANTIGEN 27.29: CA 27.29: 51 U/mL — ABNORMAL HIGH (ref 0.0–38.6)

## 2020-12-27 NOTE — Progress Notes (Signed)
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  INTERVAL HISTORY: Patient returns to clinic today for further evaluation and continuation of Zometa.  She has noticed right arm lymphedema, but otherwise feels well.  She continues to tolerate letrozole without significant side effects.  She has no neurologic complaints. She denies any recent fevers or illnesses.  She has a fair appetite, but denies weight loss.  She has no chest pain, shortness of breath, cough, or hemoptysis.  She denies any nausea, vomiting, constipation, or diarrhea.  She has no urinary complaints.  [name]  REVIEW OF SYSTEMS:   Review of Systems  Constitutional: Negative.  Negative for fever, malaise/fatigue and weight loss.  Respiratory: Negative.  Negative for cough, hemoptysis and shortness of breath.   Cardiovascular: Negative.  Negative for chest pain and leg swelling.  Gastrointestinal: Negative.  Negative for abdominal pain.  Genitourinary: Negative.  Negative for dysuria.  Musculoskeletal: Negative.  Negative for back pain and joint pain.  Skin: Negative.  Negative for rash.  Neurological: Negative.  Negative for dizziness, focal weakness, weakness and headaches.  Psychiatric/Behavioral: Negative.  The patient is not nervous/anxious.     As per HPI. Otherwise, a complete review of systems is negative.  PAST MEDICAL HISTORY: Past Medical History:  Diagnosis Date  . Anxiety   . Arthritis   . Diet-controlled diabetes mellitus ([place])    borderline  . Fatigue   . HLD (hyperlipidemia)   . Invasive carcinoma of breast ([place])  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; ER/PR (+), HER2/neu (-)  . Metastasis to bone [facility])    Breast primary  . Murmur, cardiac   . Peripheral edema   . T wave inversion on electrocardiogram 05/17/2020   leads I, II, III, aVF, V3-V6    PAST SURGICAL HISTORY: Past Surgical History:  Procedure Laterality Date  . BREAST BIOPSY Right 05/02/2020   [place] Bx, Q clip, path pending  . BREAST BIOPSY Right 05/02/2020   [place] Axilla Bx, path pending  . CHOLECYSTECTOMY    . COLONOSCOPY    . LAPAROSCOPIC ABDOMINAL EXPLORATION    . MASTECTOMY MODIFIED RADICAL Right 05/23/2020   Procedure: MASTECTOMY MODIFIED RADICAL, WITH EXCISION OF AXILLARY CONTENTS;  Surgeon: Robert Bellow, MD;  Location: [facility] ORS;  Service: General;  Laterality: Right;  . PORTACATH PLACEMENT Left 05/23/2020   Procedure: INSERTION PORT-A-CATH;  Surgeon: Robert Bellow, MD;  Location: [facility] ORS;  Service: General;  Laterality: Left;    FAMILY HISTORY: Family History  Problem Relation Age of Onset  . Breast cancer Sister     ADVANCED DIRECTIVES (Y/N):  N  HEALTH MAINTENANCE: Social History   Tobacco Use  . Smoking status: Never Smoker  . Smokeless tobacco: Never Used  Vaping Use  . Vaping Use: Never used  Substance Use Topics  . Alcohol use: Never  . Drug use: Never     Colonoscopy:  PAP:  Bone density:  Lipid panel:  Allergies  Allergen Reactions  . Nickel     Current Outpatient Medications  Medication Sig Dispense Refill  . Calcium Carbonate-Vitamin D 600-400 MG-UNIT tablet Take by mouth.    . letrozole (FEMARA) 2.5 MG tablet  Take 1 tablet (2.5 mg total) by mouth daily. 90 tablet 3  . lidocaine-prilocaine (EMLA) cream Apply 1 application topically See admin instructions. APPLY A SMALL AMOUNT TO PORT SITE AT LEAST 1 HOUR PRIOR TO IT BEING ACCESSED THEN COVER WITH PLASTIC WRAP 30 g 3  . metoprolol succinate (TOPROL-XL) 25 MG 24 hr tablet Take 25 mg by mouth daily.     No current facility-administered medications for  this visit.    OBJECTIVE: Vitals:   01/01/21 1011  BP: 130/83  Pulse: 93  Resp: 20  Temp: (!) 97.2 F (36.2 C)     Body mass index is 32.56 kg/m.    ECOG FS:0 - Asymptomatic  General: Well-developed, well-nourished, no acute distress. Eyes: Pink conjunctiva, anicteric sclera. HEENT: Normocephalic, moist mucous membranes. Lungs: No audible wheezing or coughing. Heart: Regular rate and rhythm. Abdomen: Soft, nontender, no obvious distention. Musculoskeletal: Right arm lymphedema. Neuro: Alert, answering all questions appropriately. Cranial nerves grossly intact. Skin: No rashes or petechiae noted. Psych: Normal affect.   LAB RESULTS:  Lab Results  Component Value Date   NA 137 01/01/2021   K 3.7 01/01/2021   CL 102 01/01/2021   CO2 24 01/01/2021   GLUCOSE 165 (H) 01/01/2021   BUN 16 01/01/2021   CREATININE 0.97 01/01/2021   CALCIUM 9.2 01/01/2021   GFRNONAA >60 01/01/2021   GFRAA >60 06/18/2020    No results found for: WBC, NEUTROABS, HGB, HCT, MCV, PLT   STUDIES: US Venous Img Upper Uni Right  Result Date: 01/01/2021 CLINICAL DATA:  Right upper extremity swelling. History of prior mastectomy. EXAM: RIGHT UPPER EXTREMITY VENOUS DOPPLER ULTRASOUND TECHNIQUE: Gray-scale sonography with graded compression, as well as color Doppler and duplex ultrasound were performed to evaluate the upper extremity deep venous system from the level of the subclavian vein and including the jugular, axillary, basilic, radial, ulnar and upper cephalic vein. Spectral Doppler was utilized to evaluate flow at rest and with distal augmentation maneuvers. COMPARISON:  None. FINDINGS: Contralateral Subclavian Vein: Respiratory phasicity is normal and symmetric with the symptomatic side. No evidence of thrombus. Normal compressibility. Internal Jugular Vein: No evidence of thrombus. Normal compressibility, respiratory phasicity and response to augmentation. Subclavian Vein: No evidence of thrombus.  Normal compressibility, respiratory phasicity and response to augmentation. Axillary Vein: No evidence of thrombus. Normal compressibility, respiratory phasicity and response to augmentation. Cephalic Vein: No evidence of thrombus. Normal compressibility, respiratory phasicity and response to augmentation. Basilic Vein: No evidence of thrombus. Normal compressibility, respiratory phasicity and response to augmentation. Brachial Veins: No evidence of thrombus. Normal compressibility, respiratory phasicity and response to augmentation. Radial Veins: No evidence of thrombus. Normal compressibility, respiratory phasicity and response to augmentation. Ulnar Veins: No evidence of thrombus. Normal compressibility, respiratory phasicity and response to augmentation. Venous Reflux:  None visualized. Other Findings:  None visualized. IMPRESSION: No evidence of DVT within the right upper extremity. Electronically Signed   By: Jacqulynn Cadet M.D.   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  PLAN:    1. 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  Patient underwent palliative mastectomy with bilateral lymph node sampling on May 23, 2020.  Patient's patient's  CA 27-29 continues to fluctuate tween 51.0 and 68.0.  Continue letrozole indefinitely.  We previously discussed the option of adding in chemotherapy with oral Ibrance or possibly IV treatment, but patient is not interested at this time.  Patient initiated monthly Zometa on June 18, 2020.  Continue this for 1 year and then transition her to treatment every 3 months.   Proceed with Zometa today.  Return to clinic in 4 weeks for further evaluation and continuation of treatment. 2.  Hypocalcemia: Resolved. 3.  Anxiety: Chronic and unchanged. Continue Xanax as needed. 4.  Genetic testing: Patient has declined. 5.  Right arm lymphedema: No evidence of DVT.  Patient was given a referral to lymphedema clinic.  I spent a total of 30 minutes reviewing chart data, face-to-face evaluation with the patient, counseling and coordination of care as detailed above.    Patient expressed understanding and was in agreement with this plan. She also understands that She can call clinic at any time with any questions, concerns, or complaints.   Cancer Staging Breast cancer, right [facility]) Staging form: Breast, AJCC 8th Edition - Clinical: Stage IV (cT4b, cN2, cM1, ER+, PR+, HER2-) - Signed by Lloyd Huger, MD on 05/17/2020 Stage prefix: Initial diagnosis   Lloyd Huger, MD   01/02/2021 7:48 AM

## 2021-01-01 ENCOUNTER — Other Ambulatory Visit: Payer: Self-pay

## 2021-01-01 ENCOUNTER — Inpatient Hospital Stay (HOSPITAL_BASED_OUTPATIENT_CLINIC_OR_DEPARTMENT_OTHER): Payer: Medicare PPO | Admitting: Oncology

## 2021-01-01 ENCOUNTER — Inpatient Hospital Stay: Payer: Medicare PPO

## 2021-01-01 ENCOUNTER — Encounter: Payer: Self-pay | Admitting: Oncology

## 2021-01-01 ENCOUNTER — Ambulatory Visit
Admission: RE | Admit: 2021-01-01 | Discharge: 2021-01-01 | Disposition: A | Discharge status: Home / Self Care | Payer: Medicare PPO | Source: Ambulatory Visit | Attending: Oncology | Admitting: Oncology

## 2021-01-01 VITALS — BP 130/83 | HR 93 | Temp 97.2°F | Resp 20 | Wt 178.0 lb

## 2021-01-01 DIAGNOSIS — C50911 Malignant neoplasm of unspecified site of right female breast: Secondary | ICD-10-CM

## 2021-01-01 DIAGNOSIS — M7989 Other specified soft tissue disorders: Secondary | ICD-10-CM | POA: Insufficient documentation

## 2021-01-01 DIAGNOSIS — Z170 Estrogen receptor positive status [ER+]: Secondary | ICD-10-CM

## 2021-01-01 DIAGNOSIS — C7951 Secondary malignant neoplasm of bone: Secondary | ICD-10-CM | POA: Diagnosis not present

## 2021-01-01 LAB — BASIC METABOLIC PANEL
Anion gap: 11 (ref 5–15)
BUN: 16 mg/dL (ref 8–23)
CO2: 24 mmol/L (ref 22–32)
Calcium: 9.2 mg/dL (ref 8.9–10.3)
Chloride: 102 mmol/L (ref 98–111)
Creatinine, Ser: 0.97 mg/dL (ref 0.44–1.00)
GFR, Estimated: >60 mL/min (ref >60)
Glucose, Bld: 165 mg/dL — ABNORMAL HIGH (ref 70–99)
Potassium: 3.7 mmol/L (ref 3.5–5.1)
Sodium: 137 mmol/L (ref 135–145)

## 2021-01-01 MED ORDER — ZOLEDRONIC ACID 4 MG/100ML IV SOLN
4.0000 mg | Freq: Once | INTRAVENOUS | Status: AC
  Administered 2021-01-01: 4 mg via INTRAVENOUS
  Filled 2021-01-01: qty 100

## 2021-01-01 MED ORDER — SODIUM CHLORIDE 0.9 % IV SOLN
INTRAVENOUS | Status: DC
  Filled 2021-01-01: qty 250

## 2021-01-01 MED ORDER — HEPARIN SOD (PORK) LOCK FLUSH 100 UNIT/ML IV SOLN
500.0000 [IU] | Freq: Once | INTRAVENOUS | Status: AC
  Administered 2021-01-01: 500 [IU] via INTRAVENOUS
  Filled 2021-01-01: qty 5

## 2021-01-01 MED ORDER — SODIUM CHLORIDE 0.9% FLUSH
10.0000 mL | Freq: Once | INTRAVENOUS | Status: AC
  Administered 2021-01-01: 10 mL via INTRAVENOUS
  Filled 2021-01-01: qty 10

## 2021-01-01 NOTE — Progress Notes (Signed)
Patient here today for follow up regarding breast cancer. Patient reports worsening swelling to right arm, denies pain.

## 2021-01-02 ENCOUNTER — Inpatient Hospital Stay: Payer: Medicare PPO | Admitting: Occupational Therapy

## 2021-01-02 DIAGNOSIS — I972 Postmastectomy lymphedema syndrome: Secondary | ICD-10-CM

## 2021-01-02 LAB — CANCER ANTIGEN 27.29: CA 27.29: 66.1 U/mL — ABNORMAL HIGH (ref 0.0–38.6)

## 2021-01-02 NOTE — Therapy (Signed)
[facility] [address] [place], [place], [place], [address] Phone: [phone]   Fax:  [phone]  Occupational Therapy Screen:  Patient Details  Name: Kristy York MRN: [id number] Date of Birth: 1951-03-05 No data recorded  Encounter Date: 01/02/2021   OT End of Session - 01/02/21 1951    Visit Number 0           Past Medical History:  Diagnosis Date  . Anxiety   . Arthritis   . Diet-controlled diabetes mellitus ([place])    borderline  . Fatigue   . HLD (hyperlipidemia)   . Invasive carcinoma of breast ([place]) 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; ER/PR (+), HER2/neu (-)  . Metastasis to bone [facility])    Breast primary  . Murmur, cardiac   . Peripheral edema   . T wave inversion on electrocardiogram 05/17/2020   leads I, II, III, aVF, V3-V6    Past Surgical History:  Procedure Laterality Date  . BREAST BIOPSY Right 05/02/2020   [place] Bx, Q clip, path pending  . BREAST BIOPSY Right 05/02/2020   [place] Axilla Bx, path pending  . CHOLECYSTECTOMY    . COLONOSCOPY    . LAPAROSCOPIC ABDOMINAL EXPLORATION    . MASTECTOMY MODIFIED RADICAL Right 05/23/2020   Procedure: MASTECTOMY MODIFIED RADICAL, WITH EXCISION OF AXILLARY CONTENTS;  Surgeon: Robert Bellow, MD;  Location: [facility] ORS;  Service: General;  Laterality: Right;  . 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  PLAN:    1. 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  Patient underwent palliative mastectomy with bilateral lymph node sampling on May 23, 2020.  Patient's patient's CA 27-29 continues to fluctuate tween 51.0 and 68.0.  Continue letrozole indefinitely.  We previously discussed the option of adding in chemotherapy with oral Ibrance or possibly IV treatment, but patient is not interested at this time.  Patient initiated monthly Zometa on June 18, 2020.  Continue this for 1 year and then transition her to treatment every 3 months.  Proceed with Zometa today.  Return to clinic in 4 weeks for further evaluation and continuation of treatment. 2.  Hypocalcemia: Resolved. 3.  Anxiety: Chronic and unchanged. Continue Xanax as needed. 4.  Genetic testing: Patient has declined. 5.  Right arm lymphedema: No evidence of DVT.  Patient was given a referral to lymphedema clinic.   OT SCREEN:  There were no vitals filed for this visit.   Subjective Assessment - 01/02/21 1949    Subjective  The swelling in my arm started about week ago - I try and use my hand and arm - no pain - just heavy feeling and some under my arm too    Currently in Pain? No/denies               LYMPHEDEMA/ONCOLOGY QUESTIONNAIRE - 01/02/21 0001      Right Upper Extremity Lymphedema   15 cm Proximal to Olecranon Process 38 cm    10 cm Proximal to Olecranon Process 37.5  cm    Olecranon Process 28.5 cm    15 cm Proximal to Ulnar Styloid Process 30.4 cm    10 cm Proximal to Ulnar Styloid Process 26.8 cm    Just Proximal to Ulnar Styloid Process 16.5 cm    Across Hand at [employer] 19.3 cm    At [place] of 2nd Digit 6.3 cm    At [facility] of Thumb 6.3 cm      Left Upper Extremity Lymphedema   15 cm Proximal to Olecranon Process 32.5 cm    10 cm Proximal to Olecranon Process 31.2 cm    Olecranon Process 25.5 cm    15 cm Proximal to Ulnar Styloid Process 26 cm    10 cm Proximal to Ulnar Styloid Process 22 cm    Just Proximal to  Ulnar Styloid Process 16.2 cm    Across Hand at [employer] 18 cm    At [place] of 2nd Digit 6.3 cm    At [facility] of Thumb 6 cm          Pt present at OT with stage 2 lymphedema in R UE - and thoracic involvement too Pt did show some symptoms in January with visit with Dr Bary Castilla Measurement taken - increase significantly compare to L UE-  Education done with pt and son about signs and symptoms of lymphedema- and how it is treated Pt would need tx - and will refer her to [place] at this time - at [facility] for tx  Pt was temporary fitted with isotoner glove on R hand - tubigrip D hand to elbow and tubigrip E from mid forearm to upperarm Pt and son ed on pt to wear most all the time -and off for 2 hrs am and pm -and do not allow tubigrip to roll at upper arm to do tourniquet Verbalize understanding -and will ask for referral from Dr Grayland Ormond                             Patient will benefit from skilled therapeutic intervention in order to improve the following deficits and impairments:           Visit Diagnosis: Postmastectomy lymphedema syndrome    Problem List Patient Active Problem List   Diagnosis Date Noted  . Goals of care, counseling/discussion 05/17/2020  . Breast cancer, right ([place]) 05/08/2020    Rosalyn Gess  OTR/L,CLT 01/02/2021, 7:53 PM  [facility] [address] [place], [place], [place], [address] Phone: [phone]   Fax:  [phone]  Name: Israella Hubert MRN: [id number] Date of Birth: [birth year]

## 2021-01-03 ENCOUNTER — Other Ambulatory Visit: Payer: Self-pay | Admitting: *Deleted

## 2021-01-03 DIAGNOSIS — I972 Postmastectomy lymphedema syndrome: Secondary | ICD-10-CM

## 2021-01-14 ENCOUNTER — Ambulatory Visit: Payer: Medicare PPO | Attending: Oncology | Admitting: Occupational Therapy

## 2021-01-14 ENCOUNTER — Other Ambulatory Visit: Payer: Self-pay

## 2021-01-14 ENCOUNTER — Encounter: Payer: Self-pay | Admitting: Occupational Therapy

## 2021-01-14 DIAGNOSIS — I972 Postmastectomy lymphedema syndrome: Secondary | ICD-10-CM | POA: Insufficient documentation

## 2021-01-14 NOTE — Patient Instructions (Signed)

## 2021-01-15 ENCOUNTER — Ambulatory Visit: Payer: Medicare PPO | Admitting: Occupational Therapy

## 2021-01-15 ENCOUNTER — Encounter: Payer: Self-pay | Admitting: Occupational Therapy

## 2021-01-15 NOTE — Therapy (Signed)
Kristy York MAIN [facility] SERVICES [address] [place], [place], [address] Phone: [phone]   Fax:  [phone]  Occupational Therapy Evaluation  Patient Details  Name: Kristy York MRN: [id number] Date of Birth: 08-26-1951 Referring Provider (OT): Delight Hoh, MD   Encounter Date: 01/14/2021   OT End of Session - 01/15/21 1624    Visit Number 1    Number of Visits 36    Date for OT Re-Evaluation 04/14/21    OT Start Time 0903    OT Stop Time 1010    OT Time Calculation (min) 67 min    Activity Tolerance Patient tolerated treatment well;No increased pain    Behavior During Therapy WFL for tasks assessed/performed           Past Medical History:  Diagnosis Date  . Anxiety   . Arthritis   . Diet-controlled diabetes mellitus ([place])    borderline  . Fatigue   . HLD (hyperlipidemia)   . 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 Metastasis to bone [facility])    Breast primary  . Murmur, cardiac   . Peripheral edema   . T wave inversion on electrocardiogram 05/17/2020   leads I, II, III, aVF, V3-V6    Past Surgical History:  Procedure Laterality Date  . BREAST BIOPSY Right 05/02/2020   [place] Bx, Q clip, path pending  . BREAST BIOPSY Right 05/02/2020   [place] Axilla Bx, path pending  . CHOLECYSTECTOMY    . COLONOSCOPY    . LAPAROSCOPIC ABDOMINAL EXPLORATION    . MASTECTOMY MODIFIED RADICAL Right 05/23/2020   Procedure: MASTECTOMY MODIFIED RADICAL, WITH EXCISION OF AXILLARY CONTENTS;  Surgeon: Robert Bellow, MD;  Location: [facility] ORS;  Service: General;  Laterality: Right;  . PORTACATH PLACEMENT Left 05/23/2020   Procedure: INSERTION PORT-A-CATH;  Surgeon: Robert Bellow, MD;  Location: [facility] ORS;  Service: General;  Laterality: Left;    There were no vitals filed for this visit.   Subjective Assessment - 01/14/21 1024    Subjective  Kristy York is referred to Occupational Therapy by  Dr. Grayland Ormond for evaluation and treatment of RUE/RUQ lymphedema (LE). Pt reports insideous onset  without known precipitating event. Venous Dopler completed 01/01/21 is negative for DVT.  Kristy York states, "I may have overdone it." Pt has not previously undergone lymphedema treatment. She does not have a cmpression arm sleeve. When asked about associated pain she states, "The swelling in my arm started about 3 weeks ago.  I try and use my hand and arm, but it's a heavy feeling and some under my arm too." Pt's goal for Occupational Therapy for LE care is, "to get the swelling down in my arm".    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 anxiety, arthritis, diet controlled diabetes mellitus, fatique, , heart murmer, periferal edema in feet and legs,    Limitations decreased RUE shoulder AROM, chronic RUE swelling and associated pain, fatigue    Repetition Increases Symptoms    Special Tests _ Stemmer R MPs    Currently in Pain? Yes    Pain Score 1     Pain Location Arm    Pain Orientation Right    Pain Descriptors /  Indicators Tender;Tightness;Other (Comment)   fullness   Pain Type Chronic pain    Pain Onset 1 to 4 weeks ago   3 weeks ago   Pain Frequency Intermittent    Aggravating Factors  "don't know"    Pain Relieving Factors elevation, rubbing    Effect of Pain on Daily Activities RUE/RUQ swelling and associated pain limits functional arm and hand use ( dominant limb) including lifting, carrying, reaching, pushing, pulling, grasping),  limits basic and instrumental ADLs, (dressing, bathing, grooming, fitting clothing, housework, shopping, yard work, gardening) limits productive activities and leisure pursuits, limits social participation and Tax inspector (  clothing selection)             [facility] OT Assessment - 01/15/21 0842      Assessment   Medical Diagnosis moderate, stage I RUE, RUQ post mastectomy lymphedema    Referring Provider (OT) Delight Hoh, MD    Onset Date/Surgical Date 05/23/20    Hand Dominance Right    Prior Therapy no CDT, no sleeve      Precautions   Precaution Comments Lymphedema Precautions: DM-skin      Balance Screen   Has the patient fallen in the past 6 months No    Has the patient had a decrease in activity level because of a fear of falling?  Yes    Is the patient reluctant to leave their home because of a fear of falling?  No      Home  Environment   Family/patient expects to be discharged to: Private residence    Living Arrangements --   adult son and sister   Available Help at Discharge Family    Type of Home House      Prior Function   Level of [place];Independent with basic ADLs;Independent with household mobility without device;Independent with community mobility without device;Independent with homemaking with ambulation;Independent with gait;Independent with transfers    Vocation Retired    Leisure family      IADL   Prior Level of Function Shopping I    Fish farm manager independently for [employer]    Prior Level of Function Light Housekeeping I    Light Housekeeping Performs light daily tasks but cannot maintain acceptable level of cleanliness    Prior Level of Function Meal Prep I    Meal Prep Able to complete simple warm meal prep;Able to complete simple cold meal and snack prep    Prior Level of Function Community Mobility I    Community Mobility Relies on family or friends for transportation      Mobility   Mobility Status Independent    Mobility Status Comments transport wc for distances      Vision - History   Baseline Vision Wears glasses all the time      Activity Tolerance   Activity Tolerance Endurance does not limit participation in activity     Sitting Balance --   WNL   Activity Tolerance Comments fatigue limits particpation in preferred activities      Cognition   Overall Cognitive Status Within Functional Limits for tasks assessed    Behaviors Other (comment)   mild anxiety during eval     Observation/Other Assessments   Focus on Therapeutic Outcomes (FOTO)  Intake 61/100. Predict +11 pts to 72/100    Outcome Measures BUE limb volumetrics TBA      Posture/Postural Control   Posture/Postural Control Postural limitations    Postural Limitations Rounded Shoulders;Forward head  Sensation   Light Touch Appears Intact      Coordination   Gross Motor Movements are Fluid and Coordinated Yes      ROM / Strength   AROM / PROM / Strength AROM;Strength      AROM   Overall AROM  Deficits    Overall AROM Comments 90/180 for R shoulder ABduction and flexion      Strength   Overall Strength Within functional limits for tasks performed    Overall Strength Comments BUE 5/5 all planes      Hand Function   Right Hand Gross Grasp Impaired            LYMPHEDEMA/ONCOLOGY QUESTIONNAIRE - 01/15/21 0001      Surgeries   Mastectomy Date --   08/21 w/ B LN sampling   Number Lymph Nodes Removed --   ?     Treatment   Active Chemotherapy Treatment No    Past Chemotherapy Treatment No    Active Radiation Treatment No    Past Radiation Treatment No    Current Hormone Treatment Yes    Drug Name Letrozole      What other symptoms do you have   Are you Having Heaviness or Tightness Yes    Are you having Pain Yes    Are you having pitting edema Yes    Body Site RUE/ R chest wall    Is it Hard or Difficult finding clothes that fit Yes    Do you have infections No    Is there Decreased scar mobility Yes    Stemmer Sign Yes      Lymphedema Stage   Stage --   Mod stage I-II                 OT Treatments/Exercises (OP) - 01/15/21 0854      Transfers   Transfers Sit to Stand    Sit to Stand 6: Modified  independent (Device/Increase time);With upper extremity assist;With armrests      ADLs   Grooming impaired    UB Dressing impaired    LB Dressing impaired    Bathing impaired- unable to reach disal legs and feet due to hip pain and decreased ROM; also difficuty reaching due to RE/RUQ tightness and swelling    Cooking impaired    Home Maintenance impaired    Work impaired    Leisure impaired    ADL Education Given Yes      Manual Therapy   Manual Therapy Edema management            Moderate Stage I-II  RUE/RUQ post--mastectomy lymphedema  with chest wall involvement. (16+/18 LN w metastatic carcinoma)  Skin  Description Hyper-Keratosis Peau' de Orange Shiny Tight Fibrotic/ Indurated Fatty Doughy spongy    Pores distended  x   x   x   Skin dry Flaky WNL Macerated   mild      Color Redness Present Pallor Blanching Hemosiderin Staining Other    x       Odor Malodorous Yeast Fungal infection  Absent      x   Temperature Warm Cool wnl      x   Pitting Edema   1+ 2+ 3+ 4+ Non-pitting    x       Girth Symmetrical Asymmetrical                   Distribution    R>L  Full arm and hand; R chest  wall    Stemmer Sign Positive Negative   X at R MPs    Lymphorrhea History Of:  Present Absent     x    Wounds History Of Present Absent Venous Arterial Pressure Mechanical             Signs of Infection Redness Warmth Erythema Acute Swelling Drainage Borders                    Sensation Light Touch Deep pressure Hypersensitivty   Present Impaired Present Impaired Absent Impaired   x  x  x     Nails WNL  x Fungus nail dystrophy        Hair Growth Symmetrical Asymmetrical       Skin Creases Base of toes  Ankles   Base of Fingers Medial Thighs         Abdominal pannus Lobules  Face/neck                   OT Education - 01/14/21 1036    Education Details Provided Pt education regarding lymphatic structure and function, etiologies, onset patterns  and stages of progression. Discussed  impact of obesity on lymphatic function. Outlined Complete Decongestive Therapy (CDT)  as standard of care and provided in depth information regarding 4 primary components of both Intensive and Self Management Phases, including Manual Lymph Drainage (MLD), compression wrapping and garments, skin care, and therapeutic exercise.   Pilar Plate discussion of high burden of care and contributing impact of existing co morbidities. We discussed  Importance of daily, ongoing LE self-care essential to retaining clinical gains and limiting progression.    Person(s) Educated Child(ren)   [name]   Methods Explanation;Demonstration;Handout    Comprehension Verbalized understanding;Need further instruction               OT Long Term Goals - 01/15/21 1719      OT LONG TERM GOAL #1   Title Given this patient's risk-adjustment variables, her Intake Functional Status score of 61/100 on the FOTO tool, patient will experience at least an increase in function of 11 points for a score of 72/100.    Baseline 61/100    Time 12    Period Weeks    Status New    Target Date 04/14/21      OT LONG TERM GOAL #2   Title Pt will be able to apply multi-layer, short stretch compression wraps using correct gradient techniques with max caregiver assistance to return affected R limb to premorbid size and shape, to limit pain and infection risk, and to improve functional arm and hand use for ADLs.    Baseline dependent    Time 4    Period Days    Status New    Target Date --   4th OT Rx visit     OT LONG TERM GOAL #3   Title With Max  CG Assist during visit intervals Pt  will achieve at least a 10%  limb volume reduction  in the R arm and hand during Intensive Phase CDT to achieve optimal limb volume reduction, to prevent re-accumulation of lymphatic congestion and progression of fibrosis, to limit infection risk, to improve functional arm/hand use, to improve functional performance of  basic and instrumental ADLs, and to limit LE progression.    Baseline dependent    Time 12    Period Weeks    Status New    Target Date 04/14/21  OT LONG TERM GOAL #4   Title With Max daily CG assistance Pt will achieve and sustain a least 85% compliance performing all daily LE self-care home program components throughout Intensive Phase CDT, including recommended skin care regime, lymphatic pumping ther ex, 23/7 compression wraps and simple self-MLD, to ensure optimal limb volume reduction, to limit infection risk and to limit LE progression.    Baseline dependent    Time 12    Period Weeks    Status New    Target Date 04/14/21      OT LONG TERM GOAL #5   Title By DC from OT Pt will be able to don and doff appropriate daytime compression garments and HOS devices with Mod CG assistance to limit lymphatic re-accumulation and LE progression with before transitioning to self-management phase of CDT.    Baseline Max A    Time 12    Period Weeks    Status New    Target Date 04/14/21      Long Term Additional Goals   Additional Long Term Goals Yes      OT LONG TERM GOAL #6   Title Pt will regain full RUE AROM  for flexion and abduction essential for optimal functional performance of basic and instrumental ADLs, productive activities,  and leisure pursuits by DC from OT.    Baseline Max A    Time 12    Period Weeks    Status New    Target Date 04/14/21                 Plan - 01/15/21 1628    Clinical Impression Statement Kristy York is a 70 yo female presenting with moderate, stage I-II, RUE/RUQ post-mastectomy lymphedema. 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  RUE swelling is tender and doughy with 2+ pitting below the elbow.Hand is mildly involved w/ +Stemmer sign at base of the fingers.   Pores are slightly distended but without obvious peau d'  orange, or "orage peel skin".  Color and temp are WNL. R Shoulder AROM is limited to ~90 degrees in flexion and abduction due to tightness at surgical sites. RUE/ RUQ swelling and associated pain limits Kristy York functional performance in all occupational domains, including basic and instrumental ADLs (self-care, dressing, bathing, grooming, cooking, community mobility and home management) , work and productive activities, leisure pursuits and social participation. Pt will benefit from skilled Occupational Therapy for Intensive and Management Phase Complete Decongestive Therapy (CDT) to include manual lymphatic drainage (MLD), skin care, therapeutic exercise and compression therapy to reduce limb swelling and associated pain, to limit progression of the condition and to decrease infection risk. Without skilled OT for CDT chronic lymphedema will progress and further functional decline is expected. Pt  will require daily, consistent caregiver support for compression wrapping during the Intensifve Phase of CDT. Without CG assistance her prognosis for swelling reduction is poor because she is unable to apply compression wraps independently.    OT Occupational Profile and History Comprehensive Assessment- Review of records and extensive additional review of physical, cognitive, psychosocial history related to current functional performance    Occupational performance deficits (Please refer to evaluation for details): ADL's;IADL's;Leisure;Work;Social Participation;Rest and Sleep    Body Structure / Function / Physical Skills ADL;Decreased knowledge of use of DME;Pain;Edema;UE functional use;IADL;ROM;Fascial restriction;Scar mobility;Skin integrity;Decreased knowledge of precautions    Rehab Potential Good  Clinical Decision Making Several treatment options, min-mod task modification necessary    Comorbidities Affecting Occupational Performance: Presence of comorbidities impacting occupational performance     Modification or Assistance to Complete Evaluation  Min-Moderate modification of tasks or assist with assess necessary to complete eval    OT Frequency 2x / week    OT Duration 12 weeks   and PRN for follow along support and garment replacement   OT Treatment/Interventions Self-care/ADL training;DME and/or AE instruction;Manual lymph drainage;Therapeutic activities;Compression bandaging;Therapeutic exercise;Scar mobilization;Coping strategies training;Manual Therapy;Patient/family education    Plan Complete Decongestive Therapy to RUE/RUQ to include manual lymphatic drainage a(MLD), skin care with low ph lotion/ castor oil , therapeutic lymphatic pumping exercise, compression wraps graduating to compression garments for daytime and device for HOS PRN. Will also incorporate kinesiotape, myofascial release PRN, scar massage PRN    Recommended Other Services fit with comfortable and effective gradient comptression garments. It;s likely Pt will need custom sleeve and glove, ccl 2 flat knit    Consulted and Agree with Plan of Care Patient           Patient will benefit from skilled therapeutic intervention in order to improve the following deficits and impairments:   Body Structure / Function / Physical Skills: ADL,Decreased knowledge of use of DME,Pain,Edema,UE functional use,IADL,ROM,Fascial restriction,Scar mobility,Skin integrity,Decreased knowledge of precautions       Visit Diagnosis: Postmastectomy lymphedema syndrome - Plan: Ot plan of care cert/re-cert    Problem List Patient Active Problem List   Diagnosis Date Noted  . Goals of care, counseling/discussion 05/17/2020  . Breast cancer, right ([place]) 05/08/2020   Andrey Spearman, MS, OTR/L, [facility] 01/15/21 5:37 PM   [place] MAIN [facility] SERVICES [address] [place], [place], [address] Phone: [phone]   Fax:  [phone]  Name: Kristy York MRN: [id number] Date of Birth: October 15, 1950

## 2021-01-18 ENCOUNTER — Ambulatory Visit: Payer: Medicare PPO | Admitting: Occupational Therapy

## 2021-01-18 ENCOUNTER — Other Ambulatory Visit: Payer: Self-pay

## 2021-01-18 DIAGNOSIS — I972 Postmastectomy lymphedema syndrome: Secondary | ICD-10-CM | POA: Diagnosis not present

## 2021-01-18 NOTE — Therapy (Signed)
[place] MAIN [facility] SERVICES [address] [place], [place], [address] Phone: [phone]   Fax:  [phone]  Occupational Therapy Treatment  Patient Details  Name: Kristy York MRN: [id number] Date of Birth: 1951-04-25 Referring Provider (OT): Delight Hoh, MD   Encounter Date: 01/18/2021   OT End of Session - 01/18/21 1108    Visit Number 2    Number of Visits 36    Date for OT Re-Evaluation 04/14/21    Authorization Type 16 visits initially authorized    OT Start Time 1100    OT Stop Time 1205    OT Time Calculation (min) 65 min    Activity Tolerance Patient tolerated treatment well;No increased pain    Behavior During Therapy WFL for tasks assessed/performed           Past Medical History:  Diagnosis Date  . Anxiety   . Arthritis   . Diet-controlled diabetes mellitus ([place])    borderline  . Fatigue   . HLD (hyperlipidemia)   . Invasive carcinoma of breast ([place]) 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; ER/PR (+), HER2/neu (-)  . Metastasis to bone [facility])    Breast primary  . Murmur, cardiac   . Peripheral edema   . T wave inversion on electrocardiogram 05/17/2020   leads I, II, III, aVF, V3-V6    Past Surgical History:  Procedure Laterality Date  . BREAST BIOPSY Right 05/02/2020   [place] Bx, Q clip, path pending  . BREAST BIOPSY Right 05/02/2020   [place] Axilla Bx, path pending  . CHOLECYSTECTOMY    . COLONOSCOPY    . LAPAROSCOPIC ABDOMINAL EXPLORATION    . MASTECTOMY MODIFIED RADICAL Right 05/23/2020   Procedure: MASTECTOMY MODIFIED RADICAL, WITH EXCISION OF AXILLARY CONTENTS;  Surgeon: Robert Bellow, MD;  Location: [facility] ORS;  Service: General;  Laterality: Right;  . PORTACATH PLACEMENT Left 05/23/2020   Procedure: INSERTION PORT-A-CATH;  Surgeon: Robert Bellow, MD;  Location: [facility] ORS;  Service: General;  Laterality: Left;    There were no vitals filed for this visit.   Subjective Assessment - 01/18/21 1215    Subjective   Kristy York presents for OT visit 2/36 to address RUE/RUQ post-mastectomy lymphedema. Pt is accompanied by her son, [name]. Pt does not rate limb pain numerically this morning. Pt became very anxious when tumor was mentioned .    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 anxiety, arthritis, diet controlled diabetes mellitus, fatique, , heart murmer, periferal edema in feet and legs,    Limitations decreased RUE shoulder AROM, chronic RUE swelling and associated pain, fatigue    Repetition Increases Symptoms    Special Tests _ Stemmer R MPs    Pain Onset 1 to 4 weeks ago   3 weeks ago              LYMPHEDEMA/ONCOLOGY QUESTIONNAIRE - 01/18/21 1219      Right Upper Extremity Lymphedema   Other RUE limb volume = 3265.3 ml.    Other limb volume differential measures 25%, R>L      Left Upper Extremity Lymphedema   Other LUE limb volume = 2438.0 ml                   OT  Treatments/Exercises (OP) - 01/18/21 1217      ADLs   ADL Education Given Yes      Manual Therapy   Manual Therapy Edema management;Compression Bandaging    Edema Management initial BUE comparative limb volumetrics    Compression Bandaging multi layer compression wraps using gradient technique. Applied fingerbandages leaving palm free. Applied single layer of 0.4 cm thick Rosidal foam over cotton stockinett, then short stretch wraps ( 10 cm ) in gradient conmfiguration to axilla.                  OT Education - 01/18/21 1221    Education Details Emphasis of LE self-care training today on Pt and family edu for initial volumetrics comparison and on gradient compression wrap application at home between sessions.    Person(s) Educated Child(ren);Patient   [name]    Methods Explanation;Demonstration;Handout    Comprehension Verbalized understanding;Need further instruction               OT Long Term Goals - 01/15/21 1719      OT LONG TERM GOAL #1   Title Given this patient's risk-adjustment variables, her Intake Functional Status score of 61/100 on the FOTO tool, patient will experience at least an increase in function of 11 points for a score of 72/100.    Baseline 61/100    Time 12    Period Weeks    Status New    Target Date 04/14/21      OT LONG TERM GOAL #2   Title Pt will be able to apply multi-layer, short stretch compression wraps using correct gradient techniques with max caregiver assistance to return affected R limb to premorbid size and shape, to limit pain and infection risk, and to improve functional arm and hand use for ADLs.    Baseline dependent    Time 4    Period Days    Status New    Target Date --   4th OT Rx visit     OT LONG TERM GOAL #3   Title With Max  CG Assist during visit intervals Pt  will achieve at least a 10%  limb volume reduction  in the R arm and hand during Intensive Phase CDT to achieve optimal limb volume reduction, to prevent re-accumulation of lymphatic congestion and progression of fibrosis, to limit infection risk, to improve functional arm/hand use, to improve functional performance of basic and instrumental ADLs, and to limit LE progression.    Baseline dependent    Time 12    Period Weeks    Status New    Target Date 04/14/21      OT LONG TERM GOAL #4   Title With Max daily CG assistance Pt will achieve and sustain a least 85% compliance performing all daily LE self-care home program components throughout Intensive Phase CDT, including recommended skin care regime, lymphatic pumping ther ex, 23/7 compression wraps and simple self-MLD, to ensure optimal limb volume reduction, to limit infection risk and to limit LE progression.    Baseline dependent    Time 12    Period Weeks    Status New     Target Date 04/14/21      OT LONG TERM GOAL #5   Title By DC from OT Pt will be able to don and doff appropriate daytime compression garments and HOS devices with Mod CG assistance to limit lymphatic re-accumulation and LE progression with before transitioning to self-management phase of CDT.    Baseline Max  A    Time 12    Period Weeks    Status New    Target Date 04/14/21      Long Term Additional Goals   Additional Long Term Goals Yes      OT LONG TERM GOAL #6   Title Pt will regain full RUE AROM  for flexion and abduction essential for optimal functional performance of basic and instrumental ADLs, productive activities,  and leisure pursuits by DC from OT.    Baseline Max A    Time 12    Period Weeks    Status New    Target Date 04/14/21                 Plan - 01/18/21 1212    Clinical Impression Statement Completed initial comparative limb volumetrics. RUE ( dominant. affected) is 25% greater in volume than LUE. Applied multilayer gradient compression wraps, including finger bandages, using short stretch bandages. Commenced Pt and family edu for outcome of  volumetrics and wrapping techniques. Good session. Cont as per POC.    OT Occupational Profile and History Comprehensive Assessment- Review of records and extensive additional review of physical, cognitive, psychosocial history related to current functional performance    Occupational performance deficits (Please refer to evaluation for details): ADL's;IADL's;Leisure;Work;Social Participation;Rest and Sleep    Body Structure / Function / Physical Skills ADL;Decreased knowledge of use of DME;Pain;Edema;UE functional use;IADL;ROM;Fascial restriction;Scar mobility;Skin integrity;Decreased knowledge of precautions    Rehab Potential Good    Clinical Decision Making Several treatment options, min-mod task modification necessary    Comorbidities Affecting Occupational Performance: Presence of comorbidities impacting  occupational performance    Modification or Assistance to Complete Evaluation  Min-Moderate modification of tasks or assist with assess necessary to complete eval    OT Frequency 2x / week    OT Duration 12 weeks   and PRN for follow along support and garment replacement   OT Treatment/Interventions Self-care/ADL training;DME and/or AE instruction;Manual lymph drainage;Therapeutic activities;Compression bandaging;Therapeutic exercise;Scar mobilization;Coping strategies training;Manual Therapy;Patient/family education    Plan Complete Decongestive Therapy to RUE/RUQ to include manual lymphatic drainage a(MLD), skin care with low ph lotion/ castor oil , therapeutic lymphatic pumping exercise, compression wraps graduating to compression garments for daytime and device for HOS PRN. Will also incorporate kinesiotape, myofascial release PRN, scar massage PRN    Recommended Other Services fit with comfortable and effective gradient comptression garments. It;s likely Pt will need custom sleeve and glove, ccl 2 flat knit    Consulted and Agree with Plan of Care Patient           Patient will benefit from skilled therapeutic intervention in order to improve the following deficits and impairments:   Body Structure / Function / Physical Skills: ADL,Decreased knowledge of use of DME,Pain,Edema,UE functional use,IADL,ROM,Fascial restriction,Scar mobility,Skin integrity,Decreased knowledge of precautions       Visit Diagnosis: Postmastectomy lymphedema syndrome    Problem List Patient Active Problem List   Diagnosis Date Noted  . Goals of care, counseling/discussion 05/17/2020  . Breast cancer, right ([place]) 05/08/2020   Andrey Spearman, MS, OTR/L, [facility] 01/18/21 12:22 PM  [place] MAIN [facility] SERVICES [address] [place], [place], [address] Phone: [phone]   Fax:  [phone]  Name: Anneke Cundy MRN: [id number] Date of Birth: 01/23/1951

## 2021-01-22 ENCOUNTER — Ambulatory Visit: Payer: Medicare PPO | Admitting: Occupational Therapy

## 2021-01-22 ENCOUNTER — Other Ambulatory Visit: Payer: Self-pay

## 2021-01-22 DIAGNOSIS — I972 Postmastectomy lymphedema syndrome: Secondary | ICD-10-CM

## 2021-01-22 NOTE — Therapy (Signed)
[place] MAIN [facility] SERVICES [address] [place], [place], [address] Phone: [phone]   Fax:  [phone]  Occupational Therapy Treatment  Patient Details  Name: Kristy York MRN: [id number] Date of Birth: 13-Jan-1951 Referring Provider (OT): Delight Hoh, MD   Encounter Date: 01/22/2021   OT End of Session - 01/22/21 1225    Visit Number 3    Number of Visits 36    Date for OT Re-Evaluation 04/14/21    Authorization Type 16 visits initially authorized    OT Start Time 1100    OT Stop Time 1213    OT Time Calculation (min) 73 min    Activity Tolerance Patient tolerated treatment well;No increased pain    Behavior During Therapy WFL for tasks assessed/performed           Past Medical History:  Diagnosis Date  . Anxiety   . Arthritis   . Diet-controlled diabetes mellitus ([place])    borderline  . Fatigue   . HLD (hyperlipidemia)   . Invasive carcinoma of breast ([place]) 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; ER/PR (+), HER2/neu (-)  . Metastasis to bone [facility])    Breast primary  . Murmur, cardiac   . Peripheral edema   . T wave inversion on electrocardiogram 05/17/2020   leads I, II, III, aVF, V3-V6    Past Surgical History:  Procedure Laterality Date  . BREAST BIOPSY Right 05/02/2020   [place] Bx, Q clip, path pending  . BREAST BIOPSY Right 05/02/2020   [place] Axilla Bx, path pending  . CHOLECYSTECTOMY    . COLONOSCOPY    . LAPAROSCOPIC ABDOMINAL EXPLORATION    . MASTECTOMY MODIFIED RADICAL Right 05/23/2020   Procedure: MASTECTOMY MODIFIED RADICAL, WITH EXCISION OF AXILLARY CONTENTS;  Surgeon: Robert Bellow, MD;  Location: [facility] ORS;  Service: General;  Laterality: Right;  . PORTACATH PLACEMENT Left 05/23/2020   Procedure: INSERTION PORT-A-CATH;  Surgeon: Robert Bellow, MD;  Location: [facility] ORS;  Service: General;  Laterality: Left;    There were no vitals filed for this visit.   Subjective Assessment - 01/22/21 1214    Subjective   Kristy York presents for OT visit 3/36 to address RUE/RUQ post-mastectomy lymphedema. Pt is accompanied by her son, Kristy York. Pt does not rate limb pain numerically this morning. Pt reports she was able to tolerate compression wraps 24 hours as instructed after last visit without difficulty. Pt reports she feels like her arm is some better today.    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 anxiety, arthritis, diet controlled diabetes mellitus, fatique, , heart murmer, periferal edema in feet and legs,    Limitations decreased RUE shoulder AROM, chronic RUE swelling and associated pain, fatigue    Repetition Increases Symptoms    Special Tests _ Stemmer R MPs    Pain Onset 1 to 4 weeks ago   3 weeks ago                       OT Treatments/Exercises (OP) - 01/22/21 1216      ADLs   ADL Education Given Yes      Manual Therapy   Manual Therapy Edema management;Compression Bandaging;Manual Lymphatic Drainage (MLD)    Manual Lymphatic Drainage (  MLD) MLD to RUE/RUQ with emphasis on upper arm today.Next visit will focus on trunk and chest wall directing lymphatic congestion alternatively to adjacent L axilla and R inguinal anastamosis.    Compression Bandaging multi layer compression wraps using gradient technique. Applied fingerbandages leaving palm free. Applied single layer of 0.4 cm thick Rosidal foam over cotton stockinett, then short stretch wraps ( 10 cm ) in gradient conmfiguration to axilla.                  OT Education - 01/22/21 1215    Education Details Emphasis of LE self-care training today on Pt and family edu for initial volumetrics comparison and on gradient compression wrap application at home between sessions. By end of edu CG  able to apply 3 layer gradient wraps with Max A.    Person(s) Educated Child(ren);Patient   Kristy York   Methods Explanation;Demonstration;Handout    Comprehension Verbalized understanding;Need further instruction               OT Long Term Goals - 01/15/21 1719      OT LONG TERM GOAL #1   Title Given this patient's risk-adjustment variables, her Intake Functional Status score of 61/100 on the FOTO tool, patient will experience at least an increase in function of 11 points for a score of 72/100.    Baseline 61/100    Time 12    Period Weeks    Status New    Target Date 04/14/21      OT LONG TERM GOAL #2   Title Pt will be able to apply multi-layer, short stretch compression wraps using correct gradient techniques with max caregiver assistance to return affected R limb to premorbid size and shape, to limit pain and infection risk, and to improve functional arm and hand use for ADLs.    Baseline dependent    Time 4    Period Days    Status New    Target Date --   4th OT Rx visit     OT LONG TERM GOAL #3   Title With Max  CG Assist during visit intervals Pt  will achieve at least a 10%  limb volume reduction  in the R arm and hand during Intensive Phase CDT to achieve optimal limb volume reduction, to prevent re-accumulation of lymphatic congestion and progression of fibrosis, to limit infection risk, to improve functional arm/hand use, to improve functional performance of basic and instrumental ADLs, and to limit LE progression.    Baseline dependent    Time 12    Period Weeks    Status New    Target Date 04/14/21      OT LONG TERM GOAL #4   Title With Max daily CG assistance Pt will achieve and sustain a least 85% compliance performing all daily LE self-care home program components throughout Intensive Phase CDT, including recommended skin care regime, lymphatic pumping ther ex, 23/7 compression wraps and simple self-MLD, to ensure optimal limb volume reduction, to limit infection  risk and to limit LE progression.    Baseline dependent    Time 12    Period Weeks    Status New    Target Date 04/14/21      OT LONG TERM GOAL #5   Title By DC from OT Pt will be able to don and doff appropriate daytime compression garments and HOS devices with Mod CG assistance to limit lymphatic re-accumulation and LE progression with before transitioning to self-management phase of CDT.  Baseline Max A    Time 12    Period Weeks    Status New    Target Date 04/14/21      Long Term Additional Goals   Additional Long Term Goals Yes      OT LONG TERM GOAL #6   Title Pt will regain full RUE AROM  for flexion and abduction essential for optimal functional performance of basic and instrumental ADLs, productive activities,  and leisure pursuits by DC from OT.    Baseline Max A    Time 12    Period Weeks    Status New    Target Date 04/14/21                 Plan - 01/22/21 1220    Clinical Impression Statement Pt able to tolerate compression wraps applied in last session for 24 hours as instructed without difficulty by Pt and family report. Slight volume reduction in R arm and hand is observed today with increased skin wrinkles and less peau d' orange observed at forearm. Dense congesttion ins fa remains despite slight volume reduction. Provided intro level MLD to RUE today without pain or sensativity to palpation. Next vsisit extend MLD to include axilla , chest wall towards L anteriopr axillary anastamosis and accross back via PAA, and lastly inferiorlt along ipsilateral axillary-inguinal pathway. After skilled teaching son able to apply multi layer compression wraps using correct gradient techniques with Max A. They will practice wrapping daiy ubtil next visit. Positive response to treatment so soon after commencing CDT is encouraging for  Pt and her son. Cont as per POC.    OT Occupational Profile and History Comprehensive Assessment- Review of records and extensive additional  review of physical, cognitive, psychosocial history related to current functional performance    Occupational performance deficits (Please refer to evaluation for details): ADL's;IADL's;Leisure;Work;Social Participation;Rest and Sleep    Body Structure / Function / Physical Skills ADL;Decreased knowledge of use of DME;Pain;Edema;UE functional use;IADL;ROM;Fascial restriction;Scar mobility;Skin integrity;Decreased knowledge of precautions    Rehab Potential Good    Clinical Decision Making Several treatment options, min-mod task modification necessary    Comorbidities Affecting Occupational Performance: Presence of comorbidities impacting occupational performance    Modification or Assistance to Complete Evaluation  Min-Moderate modification of tasks or assist with assess necessary to complete eval    OT Frequency 2x / week    OT Duration 12 weeks   and PRN for follow along support and garment replacement   OT Treatment/Interventions Self-care/ADL training;DME and/or AE instruction;Manual lymph drainage;Therapeutic activities;Compression bandaging;Therapeutic exercise;Scar mobilization;Coping strategies training;Manual Therapy;Patient/family education    Plan Complete Decongestive Therapy to RUE/RUQ to include manual lymphatic drainage a(MLD), skin care with low ph lotion/ castor oil , therapeutic lymphatic pumping exercise, compression wraps graduating to compression garments for daytime and device for HOS PRN. Will also incorporate kinesiotape, myofascial release PRN, scar massage PRN    Recommended Other Services fit with comfortable and effective gradient comptression garments. It;s likely Pt will need custom sleeve and glove, ccl 2 flat knit    Consulted and Agree with Plan of Care Patient           Patient will benefit from skilled therapeutic intervention in order to improve the following deficits and impairments:   Body Structure / Function / Physical Skills: ADL,Decreased knowledge of use  of DME,Pain,Edema,UE functional use,IADL,ROM,Fascial restriction,Scar mobility,Skin integrity,Decreased knowledge of precautions       Visit Diagnosis: Postmastectomy lymphedema syndrome    Problem List  Patient Active Problem List   Diagnosis Date Noted  . Goals of care, counseling/discussion 05/17/2020  . Breast cancer, right ([place]) 05/08/2020    Andrey Spearman, MS, OTR/L, [facility] 01/22/21 12:27 PM  [place] MAIN [facility] SERVICES [address] [place], [place], [address] Phone: [phone]   Fax:  [phone]  Name: Kristy York MRN: [id number] Date of Birth: 04/09/1951

## 2021-01-25 ENCOUNTER — Ambulatory Visit: Payer: Medicare PPO | Admitting: Occupational Therapy

## 2021-01-25 ENCOUNTER — Other Ambulatory Visit: Payer: Self-pay

## 2021-01-25 DIAGNOSIS — I972 Postmastectomy lymphedema syndrome: Secondary | ICD-10-CM

## 2021-01-25 NOTE — Progress Notes (Signed)
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  INTERVAL HISTORY: Patient returns to clinic today for further evaluation and continuation of Zometa.  Her right arm lymphedema has significantly improved since evaluation in the lymphedema clinic.  She is currently wearing a sleeve and a wrap. She continues to tolerate letrozole without significant side effects.  She has no neurologic complaints. She denies any recent fevers or illnesses.  She has a fair appetite, but denies weight loss.  She has no chest pain, shortness of breath, cough, or hemoptysis.  She denies any nausea, vomiting, constipation, or diarrhea.  She has no urinary complaints.  Patient offers no specific complaints today.  REVIEW OF SYSTEMS:   Review of Systems  Constitutional: Negative.  Negative for fever, malaise/fatigue and weight loss.  Respiratory: Negative.  Negative for cough, hemoptysis and shortness of breath.   Cardiovascular: Negative.  Negative for chest pain and leg swelling.  Gastrointestinal: Negative.  Negative for abdominal pain.  Genitourinary: Negative.  Negative for dysuria.  Musculoskeletal: Negative.  Negative for back pain and joint pain.  Skin: Negative.  Negative for rash.  Neurological: Negative.  Negative for dizziness, focal weakness, weakness and headaches.  Psychiatric/Behavioral: Negative.  The patient is not nervous/anxious.     As per HPI. Otherwise, a complete review of systems is negative.  PAST MEDICAL HISTORY: Past Medical History:  Diagnosis Date  . Anxiety   . Arthritis   . Diet-controlled diabetes mellitus ([place])    borderline  . Fatigue    . HLD (hyperlipidemia)   . Invasive carcinoma of breast ([place]) 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; ER/PR (+), HER2/neu (-)  . Metastasis to bone [facility])    Breast primary  . Murmur, cardiac   . Peripheral edema   . T wave inversion on electrocardiogram 05/17/2020   leads I, II, III, aVF, V3-V6    PAST SURGICAL HISTORY: Past Surgical History:  Procedure Laterality Date  . BREAST BIOPSY Right 05/02/2020   [place] Bx, Q clip, path pending  . BREAST BIOPSY Right 05/02/2020   [place] Axilla Bx, path pending  . CHOLECYSTECTOMY    . COLONOSCOPY    . LAPAROSCOPIC ABDOMINAL EXPLORATION    . MASTECTOMY MODIFIED RADICAL Right 05/23/2020   Procedure: MASTECTOMY MODIFIED RADICAL, WITH EXCISION OF AXILLARY CONTENTS;  Surgeon: Robert Bellow, MD;  Location: [facility] ORS;  Service: General;  Laterality: Right;  . PORTACATH PLACEMENT Left 05/23/2020   Procedure: INSERTION PORT-A-CATH;  Surgeon: Robert Bellow, MD;  Location: [facility] ORS;  Service: General;  Laterality: Left;    FAMILY HISTORY: Family History  Problem Relation Age of Onset  . Breast cancer Sister     ADVANCED DIRECTIVES (Y/N):  N  HEALTH MAINTENANCE: Social History   Tobacco Use  . Smoking status: Never Smoker  . Smokeless tobacco: Never Used  Vaping Use  . Vaping Use: Never used  Substance Use Topics  . Alcohol use: Never  . Drug use: Never     Colonoscopy:  PAP:  Bone density:  Lipid panel:  Allergies  Allergen Reactions  . Nickel     Current Outpatient Medications  Medication Sig Dispense Refill  . Calcium Carbonate-Vitamin D 600-400 MG-UNIT tablet Take  by mouth.    . letrozole (FEMARA) 2.5 MG tablet Take 1 tablet (2.5 mg total) by mouth daily. 90 tablet 3  . lidocaine-prilocaine (EMLA) cream Apply 1 application topically See admin instructions. APPLY A SMALL AMOUNT TO PORT SITE AT LEAST 1 HOUR PRIOR TO IT BEING ACCESSED THEN COVER WITH PLASTIC WRAP 30 g 3  . metoprolol succinate (TOPROL-XL) 25 MG 24 hr tablet Take 25 mg by  mouth daily.     No current facility-administered medications for this visit.   Facility-Administered Medications Ordered in Other Visits  Medication Dose Route Frequency Provider Last Rate Last Admin  . 0.9 %  sodium chloride infusion   Intravenous Continuous Kristy Huger, MD   Stopped at 01/29/21 1134    OBJECTIVE: Vitals:   01/29/21 1013  BP: 134/71  Pulse: 85  Resp: 20  Temp: 98.8 F (37.1 C)     Body mass index is 33.36 kg/m.    ECOG FS:0 - Asymptomatic  General: Well-developed, well-nourished, no acute distress. Eyes: Pink conjunctiva, anicteric sclera. HEENT: Normocephalic, moist mucous membranes. Lungs: No audible wheezing or coughing. Heart: Regular rate and rhythm. Abdomen: Soft, nontender, no obvious distention. Musculoskeletal: No edema, cyanosis, or clubbing.  Right arm in lymphedema sleeve. Neuro: Alert, answering all questions appropriately. Cranial nerves grossly intact. Skin: No rashes or petechiae noted. Psych: Normal affect.   LAB RESULTS:  Lab Results  Component Value Date   NA 139 01/29/2021   K 3.7 01/29/2021   CL 104 01/29/2021   CO2 24 01/29/2021   GLUCOSE 165 (H) 01/29/2021   BUN 16 01/29/2021   CREATININE 0.92 01/29/2021   CALCIUM 9.3 01/29/2021   GFRNONAA >60 01/29/2021   GFRAA >60 06/18/2020    No results found for: WBC, NEUTROABS, HGB, HCT, MCV, PLT   STUDIES: US Venous Img Upper Uni Right  Result Date: 01/01/2021 CLINICAL DATA:  Right upper extremity swelling. History of prior mastectomy. EXAM: RIGHT UPPER EXTREMITY VENOUS DOPPLER ULTRASOUND TECHNIQUE: Gray-scale sonography with graded compression, as well as color Doppler and duplex ultrasound were performed to evaluate the upper extremity deep venous system from the level of the subclavian vein and including the jugular, axillary, basilic, radial, ulnar and upper cephalic vein. Spectral Doppler was utilized to evaluate flow at rest and with distal augmentation maneuvers.  COMPARISON:  None. FINDINGS: Contralateral Subclavian Vein: Respiratory phasicity is normal and symmetric with the symptomatic side. No evidence of thrombus. Normal compressibility. Internal Jugular Vein: No evidence of thrombus. Normal compressibility, respiratory phasicity and response to augmentation. Subclavian Vein: No evidence of thrombus. Normal compressibility, respiratory phasicity and response to augmentation. Axillary Vein: No evidence of thrombus. Normal compressibility, respiratory phasicity and response to augmentation. Cephalic Vein: No evidence of thrombus. Normal compressibility, respiratory phasicity and response to augmentation. Basilic Vein: No evidence of thrombus. Normal compressibility, respiratory phasicity and response to augmentation. Brachial Veins: No evidence of thrombus. Normal compressibility, respiratory phasicity and response to augmentation. Radial Veins: No evidence of thrombus. Normal compressibility, respiratory phasicity and response to augmentation. Ulnar Veins: No evidence of thrombus. Normal compressibility, respiratory phasicity and response to augmentation. Venous Reflux:  None visualized. Other Findings:  None visualized. IMPRESSION: No evidence of DVT within the right upper extremity. Electronically Signed   By: Jacqulynn Cadet M.D.   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  PLAN:    1. Stage IV ER/PR positive, HER-2 negative  invasive carcinoma of the right breast with metastatic lesions to bone: PET scan results from May 16, 2020 reviewed independently with intensely hypermetabolic right breast mass with metastatic lesions in bilateral axillary lymph node as well as multifocal hypermetabolic bone metastasis.  Patient underwent palliative mastectomy with bilateral lymph node sampling on May 23, 2020.  Patient's patient's CA 27-29 continues to  fluctuate tween 51.0 and 68.0.  Continue letrozole indefinitely.  We previously discussed the option of adding in chemotherapy with oral Ibrance or possibly IV treatment, but patient is not interested at this time.  Patient initiated monthly Zometa on June 18, 2020.  Continue this for 1 year and then transition her to treatment every 3 months.  Proceed with Zometa today.  Return to clinic in 4 weeks for laboratory work and treatment only and then in 8 weeks for laboratory work, further evaluation, and continuation of Zometa.   2.  Hypocalcemia: Resolved. 3.  Anxiety: Chronic and unchanged. Continue Xanax as needed. 4.  Genetic testing: Patient has declined. 5.  Right arm lymphedema: No evidence of DVT.  Continue follow-up with lymphedema clinic as needed.  I spent a total of 30 minutes reviewing chart data, face-to-face evaluation with the patient, counseling and coordination of care as detailed above.    Patient expressed understanding and was in agreement with this plan. She also understands that She can call clinic at any time with any questions, concerns, or complaints.   Cancer Staging Breast cancer, right [facility]) Staging form: Breast, AJCC 8th Edition - Clinical: Stage IV (cT4b, cN2, cM1, ER+, PR+, HER2-) - Signed by Kristy Huger, MD on 05/17/2020 Stage prefix: Initial diagnosis   Kristy Huger, MD   01/29/2021 1:21 PM

## 2021-01-25 NOTE — Therapy (Signed)
[place] MAIN [facility] SERVICES [address] [place], [place], [address] Phone: [phone]   Fax:  [phone]  Occupational Therapy Treatment  Patient Details  Name: Kristy York MRN: [id number] Date of Birth: December 01, 1950 Referring Provider (OT): Delight Hoh, MD   Encounter Date: 01/25/2021   OT End of Session - 01/25/21 1114    Visit Number 4    Number of Visits 36    Date for OT Re-Evaluation 04/14/21    Authorization Type 16 visits initially authorized    OT Start Time 1105    OT Stop Time 1220    OT Time Calculation (min) 75 min    Activity Tolerance Patient tolerated treatment well;No increased pain    Behavior During Therapy WFL for tasks assessed/performed           Past Medical History:  Diagnosis Date  . Anxiety   . Arthritis   . Diet-controlled diabetes mellitus ([place])    borderline  . Fatigue   . HLD (hyperlipidemia)   . Invasive carcinoma of breast ([place]) 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; ER/PR (+), HER2/neu (-)  . Metastasis to bone [facility])    Breast primary  . Murmur, cardiac   . Peripheral edema   . T wave inversion on electrocardiogram 05/17/2020   leads I, II, III, aVF, V3-V6    Past Surgical History:  Procedure Laterality Date  . BREAST BIOPSY Right 05/02/2020   [place] Bx, Q clip, path pending  . BREAST BIOPSY Right 05/02/2020   [place] Axilla Bx, path pending  . CHOLECYSTECTOMY    . COLONOSCOPY    . LAPAROSCOPIC ABDOMINAL EXPLORATION    . MASTECTOMY MODIFIED RADICAL Right 05/23/2020   Procedure: MASTECTOMY MODIFIED RADICAL, WITH EXCISION OF AXILLARY CONTENTS;  Surgeon: Robert Bellow, MD;  Location: [facility] ORS;  Service: General;  Laterality: Right;  . PORTACATH PLACEMENT Left 05/23/2020   Procedure: INSERTION PORT-A-CATH;  Surgeon: Robert Bellow, MD;  Location: [facility] ORS;  Service: General;  Laterality: Left;    There were no vitals filed for this visit.   Subjective Assessment - 01/25/21 1114    Subjective   [name] presents for OT visit 4/36 to address RUE/RUQ post-mastectomy lymphedema. Pt is accompanied by her son, [name].She presents with compression wraps in place.  Pt rated R arm pain as 3/10 and describes as "soreness " at proximal arm.    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 anxiety, arthritis, diet controlled diabetes mellitus, fatique, , heart murmer, periferal edema in feet and legs,    Limitations decreased RUE shoulder AROM, chronic RUE swelling and associated pain, fatigue    Repetition Increases Symptoms    Special Tests _ [place] R MPs    Pain Onset 1 to 4 weeks ago   3 weeks ago                       OT Treatments/Exercises (OP) - 01/25/21 1247      ADLs   ADL Education Given Yes      Manual Therapy   Manual Therapy Edema management;Compression Bandaging;Manual Lymphatic Drainage (MLD)    Manual Lymphatic Drainage (MLD) MLD to RUE/RUQ with emphasis on upper arm today.Next visit will focus on trunk and  chest wall directing lymphatic congestion alternatively to adjacent L axilla and R inguinal anastamosis.    Compression Bandaging multi layer compression wraps using gradient technique. Applied fingerbandages leaving palm free. Applied single layer of 0.4 cm thick Rosidal foam over cotton stockinett, then short stretch wraps ( 10 cm ) in gradient conmfiguration to axilla.                  OT Education - 01/25/21 1247    Education Details Continued Pt/ CG edu for lymphedema self care  and home program throughout session. Topics include multilayer, gradient compression wrapping, simple self-MLD, therapeutic lymphatic pumping exercises, skin/nail care, risk reduction factors and LE precautions, compression  garments/recommendations and wear and care schedule and compression garment donning / doffing using assistive devices. All questions answered to the Pt's satisfaction, and Pt demonstrates understanding by report.    Person(s) Educated Child(ren);Patient   [name]   Methods Explanation;Demonstration;Handout    Comprehension Verbalized understanding;Need further instruction               OT Long Term Goals - 01/15/21 1719      OT LONG TERM GOAL #1   Title Given this patient's risk-adjustment variables, her Intake Functional Status score of 61/100 on the FOTO tool, patient will experience at least an increase in function of 11 points for a score of 72/100.    Baseline 61/100    Time 12    Period Weeks    Status New    Target Date 04/14/21      OT LONG TERM GOAL #2   Title Pt will be able to apply multi-layer, short stretch compression wraps using correct gradient techniques with max caregiver assistance to return affected R limb to premorbid size and shape, to limit pain and infection risk, and to improve functional arm and hand use for ADLs.    Baseline dependent    Time 4    Period Days    Status New    Target Date --   4th OT Rx visit     OT LONG TERM GOAL #3   Title With Max  CG Assist during visit intervals Pt  will achieve at least a 10%  limb volume reduction  in the R arm and hand during Intensive Phase CDT to achieve optimal limb volume reduction, to prevent re-accumulation of lymphatic congestion and progression of fibrosis, to limit infection risk, to improve functional arm/hand use, to improve functional performance of basic and instrumental ADLs, and to limit LE progression.    Baseline dependent    Time 12    Period Weeks    Status New    Target Date 04/14/21      OT LONG TERM GOAL #4   Title With Max daily CG assistance Pt will achieve and sustain a least 85% compliance performing all daily LE self-care home program components throughout Intensive Phase CDT,  including recommended skin care regime, lymphatic pumping ther ex, 23/7 compression wraps and simple self-MLD, to ensure optimal limb volume reduction, to limit infection risk and to limit LE progression.    Baseline dependent    Time 12    Period Weeks    Status New    Target Date 04/14/21      OT LONG TERM GOAL #5   Title By DC from OT Pt will be able to don and doff appropriate daytime compression garments and HOS devices with Mod CG assistance to limit lymphatic re-accumulation and LE progression with before  transitioning to self-management phase of CDT.    Baseline Max A    Time 12    Period Weeks    Status New    Target Date 04/14/21      Long Term Additional Goals   Additional Long Term Goals Yes      OT LONG TERM GOAL #6   Title Pt will regain full RUE AROM  for flexion and abduction essential for optimal functional performance of basic and instrumental ADLs, productive activities,  and leisure pursuits by DC from OT.    Baseline Max A    Time 12    Period Weeks    Status New    Target Date 04/14/21                 Plan - 01/25/21 1243    Clinical Impression Statement Majority of manual therapy concentrated on chest wall    today with lymphatic congestion diected towards R clavical and adjacent anterior axillary anastamosis. Pt tolerated scar massage to chest wall and fibrosis technique to distal forearm where densests swelling is palpated. Son has mastered gradient compression wrapping. Cont as per POC.    OT Occupational Profile and History Comprehensive Assessment- Review of records and extensive additional review of physical, cognitive, psychosocial history related to current functional performance    Occupational performance deficits (Please refer to evaluation for details): ADL's;IADL's;Leisure;Work;Social Participation;Rest and Sleep    Body Structure / Function / Physical Skills ADL;Decreased knowledge of use of DME;Pain;Edema;UE functional use;IADL;ROM;Fascial  restriction;Scar mobility;Skin integrity;Decreased knowledge of precautions    Rehab Potential Good    Clinical Decision Making Several treatment options, min-mod task modification necessary    Comorbidities Affecting Occupational Performance: Presence of comorbidities impacting occupational performance    Modification or Assistance to Complete Evaluation  Min-Moderate modification of tasks or assist with assess necessary to complete eval    OT Frequency 2x / week    OT Duration 12 weeks   and PRN for follow along support and garment replacement   OT Treatment/Interventions Self-care/ADL training;DME and/or AE instruction;Manual lymph drainage;Therapeutic activities;Compression bandaging;Therapeutic exercise;Scar mobilization;Coping strategies training;Manual Therapy;Patient/family education    Plan Complete Decongestive Therapy to RUE/RUQ to include manual lymphatic drainage a(MLD), skin care with low ph lotion/ castor oil , therapeutic lymphatic pumping exercise, compression wraps graduating to compression garments for daytime and device for HOS PRN. Will also incorporate kinesiotape, myofascial release PRN, scar massage PRN    Recommended Other Services fit with comfortable and effective gradient comptression garments. It;s likely Pt will need custom sleeve and glove, ccl 2 flat knit    Consulted and Agree with Plan of Care Patient           Patient will benefit from skilled therapeutic intervention in order to improve the following deficits and impairments:   Body Structure / Function / Physical Skills: ADL,Decreased knowledge of use of DME,Pain,Edema,UE functional use,IADL,ROM,Fascial restriction,Scar mobility,Skin integrity,Decreased knowledge of precautions       Visit Diagnosis: Postmastectomy lymphedema syndrome    Problem List Patient Active Problem List   Diagnosis Date Noted  . Goals of care, counseling/discussion 05/17/2020  . Breast cancer, right ([place]) 05/08/2020    Andrey Spearman, MS, OTR/L, [facility] 01/25/21 12:49 PM   [place] MAIN [facility] SERVICES [address] [place], [place], [address] Phone: [phone]   Fax:  [phone]  Name: Kelce Bouton MRN: [id number] Date of Birth: January 27, 1951

## 2021-01-28 ENCOUNTER — Other Ambulatory Visit: Payer: Self-pay

## 2021-01-28 ENCOUNTER — Ambulatory Visit: Payer: Medicare PPO | Admitting: Occupational Therapy

## 2021-01-28 DIAGNOSIS — I972 Postmastectomy lymphedema syndrome: Secondary | ICD-10-CM

## 2021-01-28 NOTE — Therapy (Signed)
[facility] [address] [place], [place], [address] Phone: [phone]   Fax:  [phone]  Occupational Therapy Treatment  Patient Details  Name: Kristy York MRN: [id number] Date of Birth: Mar 31, 1951 Referring Provider (OT): Delight Hoh, MD   Encounter Date: 01/28/2021   OT End of Session - 01/28/21 1016    Visit Number 5    Number of Visits 36    Date for OT Re-Evaluation 04/14/21    Authorization Type 16 visits initially authorized    OT Start Time 1007    OT Stop Time 1110    OT Time Calculation (min) 63 min    Activity Tolerance Patient tolerated treatment well;No increased pain    Behavior During Therapy WFL for tasks assessed/performed           Past Medical History:  Diagnosis Date  . Anxiety   . Arthritis   . Diet-controlled diabetes mellitus ([place])    borderline  . Fatigue   . HLD (hyperlipidemia)   . 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 Metastasis to bone [facility])    Breast primary  . Murmur, cardiac   . Peripheral edema   . T wave inversion on electrocardiogram 05/17/2020   leads I, II, III, aVF, V3-V6    Past Surgical History:  Procedure Laterality Date  . BREAST BIOPSY Right 05/02/2020   [place] Bx, Q clip, path pending  . BREAST BIOPSY Right 05/02/2020   [place] Axilla Bx, path pending  . CHOLECYSTECTOMY    . COLONOSCOPY    . LAPAROSCOPIC ABDOMINAL EXPLORATION    . MASTECTOMY MODIFIED RADICAL Right 05/23/2020   Procedure: MASTECTOMY MODIFIED RADICAL, WITH EXCISION OF AXILLARY CONTENTS;  Surgeon: Robert Bellow, MD;  Location: [facility] ORS;  Service: General;  Laterality: Right;  . PORTACATH PLACEMENT Left 05/23/2020   Procedure: INSERTION PORT-A-CATH;  Surgeon: Robert Bellow, MD;  Location: [facility] ORS;  Service: General;  Laterality: Left;    There were no vitals filed for this visit.   Subjective Assessment - 01/28/21 1017    Subjective   Kristy York presents for OT visit 5/36 to address RUE/RUQ post-mastectomy lymphedema. Pt is accompanied by her son, Kristy York.She presents with compression wraps in place.  Pt c/o "heaviness " in the R arm, butr does not rate pain numerically. Kristy York practiced wrapping this weekend and staes he feels more confident about it.    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 anxiety, arthritis, diet controlled diabetes mellitus, fatique, , heart murmer, periferal edema in feet and legs,    Limitations decreased RUE shoulder AROM, chronic RUE swelling and associated pain, fatigue    Repetition Increases Symptoms    Special Tests _ [place] R MPs    Pain Onset 1 to 4 weeks ago   3 weeks ago                       OT Treatments/Exercises (OP) - 01/28/21 1613      ADLs   ADL Education Given Yes      Manual Therapy   Manual Therapy Edema management;Compression Bandaging;Manual Lymphatic Drainage (MLD)    Manual Lymphatic Drainage (MLD) MLD to RUE/RUQ  with emphasis on upper arm today.Next visit will focus on trunk and chest wall directing lymphatic congestion alternatively to adjacent L axilla and R inguinal anastamosis.    Compression Bandaging multi layer compression wraps using gradient technique. Applied fingerbandages leaving palm free. Applied single layer of 0.4 cm thick Rosidal foam over cotton stockinett, then short stretch wraps ( 10 cm ) in gradient conmfiguration to axilla.                  OT Education - 01/28/21 1018    Education Details Continued Pt/ CG edu for lymphedema self care  and home program throughout session. Topics include multilayer, gradient compression wrapping, simple self-MLD, therapeutic lymphatic pumping exercises,  skin/nail care, risk reduction factors and LE precautions, compression garments/recommendations and wear and care schedule and compression garment donning / doffing using assistive devices. All questions answered to the Pt's satisfaction, and Pt demonstrates understanding by report.    Person(s) Educated Child(ren);Patient   Kristy York   Methods Explanation;Demonstration;Handout    Comprehension Verbalized understanding;Need further instruction               OT Long Term Goals - 01/15/21 1719      OT LONG TERM GOAL #1   Title Given this patient's risk-adjustment variables, her Intake Functional Status score of 61/100 on the FOTO tool, patient will experience at least an increase in function of 11 points for a score of 72/100.    Baseline 61/100    Time 12    Period Weeks    Status New    Target Date 04/14/21      OT LONG TERM GOAL #2   Title Pt will be able to apply multi-layer, short stretch compression wraps using correct gradient techniques with max caregiver assistance to return affected R limb to premorbid size and shape, to limit pain and infection risk, and to improve functional arm and hand use for ADLs.    Baseline dependent    Time 4    Period Days    Status New    Target Date --   4th OT Rx visit     OT LONG TERM GOAL #3   Title With Max  CG Assist during visit intervals Pt  will achieve at least a 10%  limb volume reduction  in the R arm and hand during Intensive Phase CDT to achieve optimal limb volume reduction, to prevent re-accumulation of lymphatic congestion and progression of fibrosis, to limit infection risk, to improve functional arm/hand use, to improve functional performance of basic and instrumental ADLs, and to limit LE progression.    Baseline dependent    Time 12    Period Weeks    Status New    Target Date 04/14/21      OT LONG TERM GOAL #4   Title With Max daily CG assistance Pt will achieve and sustain a least 85% compliance performing all daily LE  self-care home program components throughout Intensive Phase CDT, including recommended skin care regime, lymphatic pumping ther ex, 23/7 compression wraps and simple self-MLD, to ensure optimal limb volume reduction, to limit infection risk and to limit LE progression.    Baseline dependent    Time 12    Period Weeks    Status New    Target Date 04/14/21      OT LONG TERM GOAL #5   Title By DC from OT Pt will be able to don and doff appropriate daytime compression garments and HOS devices with  Mod CG assistance to limit lymphatic re-accumulation and LE progression with before transitioning to self-management phase of CDT.    Baseline Max A    Time 12    Period Weeks    Status New    Target Date 04/14/21      Long Term Additional Goals   Additional Long Term Goals Yes      OT LONG TERM GOAL #6   Title Pt will regain full RUE AROM  for flexion and abduction essential for optimal functional performance of basic and instrumental ADLs, productive activities,  and leisure pursuits by DC from OT.    Baseline Max A    Time 12    Period Weeks    Status New    Target Date 04/14/21                 Plan - 01/28/21 1609    Clinical Impression Statement Tissue density and swelling in RUE is minimally changed since last visit. Family CG reports wrapped over the weekend and agreed that he used correct techniques after observing wraps when removing them before treatment. So far hand swelling has remained only mildly involved. Fit Pt with replacement Isotoner glove today for use with ncompression wraps instead of wrappibng her fingers. Pt tolerated MLD to RUE and RUQ without increased pain. Tolerance for compression wraps is improving after each session. Cont as per POC.    OT Occupational Profile and History Comprehensive Assessment- Review of records and extensive additional review of physical, cognitive, psychosocial history related to current functional performance    Occupational  performance deficits (Please refer to evaluation for details): ADL's;IADL's;Leisure;Work;Social Participation;Rest and Sleep    Body Structure / Function / Physical Skills ADL;Decreased knowledge of use of DME;Pain;Edema;UE functional use;IADL;ROM;Fascial restriction;Scar mobility;Skin integrity;Decreased knowledge of precautions    Rehab Potential Good    Clinical Decision Making Several treatment options, min-mod task modification necessary    Comorbidities Affecting Occupational Performance: Presence of comorbidities impacting occupational performance    Modification or Assistance to Complete Evaluation  Min-Moderate modification of tasks or assist with assess necessary to complete eval    OT Frequency 2x / week    OT Duration 12 weeks   and PRN for follow along support and garment replacement   OT Treatment/Interventions Self-care/ADL training;DME and/or AE instruction;Manual lymph drainage;Therapeutic activities;Compression bandaging;Therapeutic exercise;Scar mobilization;Coping strategies training;Manual Therapy;Patient/family education    Plan Complete Decongestive Therapy to RUE/RUQ to include manual lymphatic drainage a(MLD), skin care with low ph lotion/ castor oil , therapeutic lymphatic pumping exercise, compression wraps graduating to compression garments for daytime and device for HOS PRN. Will also incorporate kinesiotape, myofascial release PRN, scar massage PRN    Recommended Other Services fit with comfortable and effective gradient comptression garments. It;s likely Pt will need custom sleeve and glove, ccl 2 flat knit    Consulted and Agree with Plan of Care Patient           Patient will benefit from skilled therapeutic intervention in order to improve the following deficits and impairments:   Body Structure / Function / Physical Skills: ADL,Decreased knowledge of use of DME,Pain,Edema,UE functional use,IADL,ROM,Fascial restriction,Scar mobility,Skin integrity,Decreased  knowledge of precautions       Visit Diagnosis: Postmastectomy lymphedema syndrome    Problem List Patient Active Problem List   Diagnosis Date Noted  . Goals of care, counseling/discussion 05/17/2020  . Breast cancer, right ([place]) 05/08/2020    Andrey Spearman, MS, OTR/L, [facility] 01/28/21 4:22 PM   [facility]  [place] [facility] [address] [place], [place], [address] Phone: [phone]   Fax:  [phone]  Name: Kristy York MRN: [id number] Date of Birth: 08-03-1951

## 2021-01-29 ENCOUNTER — Inpatient Hospital Stay: Payer: Medicare PPO

## 2021-01-29 ENCOUNTER — Encounter: Payer: Self-pay | Admitting: Oncology

## 2021-01-29 ENCOUNTER — Inpatient Hospital Stay: Payer: Medicare PPO | Attending: Oncology | Admitting: Oncology

## 2021-01-29 VITALS — BP 134/71 | HR 85 | Temp 98.8°F | Resp 20 | Wt 182.4 lb

## 2021-01-29 DIAGNOSIS — C50911 Malignant neoplasm of unspecified site of right female breast: Secondary | ICD-10-CM | POA: Diagnosis not present

## 2021-01-29 DIAGNOSIS — C7951 Secondary malignant neoplasm of bone: Secondary | ICD-10-CM | POA: Insufficient documentation

## 2021-01-29 DIAGNOSIS — Z170 Estrogen receptor positive status [ER+]: Secondary | ICD-10-CM | POA: Insufficient documentation

## 2021-01-29 LAB — BASIC METABOLIC PANEL
Anion gap: 11 (ref 5–15)
BUN: 16 mg/dL (ref 8–23)
CO2: 24 mmol/L (ref 22–32)
Calcium: 9.3 mg/dL (ref 8.9–10.3)
Chloride: 104 mmol/L (ref 98–111)
Creatinine, Ser: 0.92 mg/dL (ref 0.44–1.00)
GFR, Estimated: >60 mL/min (ref >60)
Glucose, Bld: 165 mg/dL — ABNORMAL HIGH (ref 70–99)
Potassium: 3.7 mmol/L (ref 3.5–5.1)
Sodium: 139 mmol/L (ref 135–145)

## 2021-01-29 MED ORDER — HEPARIN SOD (PORK) LOCK FLUSH 100 UNIT/ML IV SOLN
INTRAVENOUS | Status: AC
  Filled 2021-01-29: qty 5

## 2021-01-29 MED ORDER — SODIUM CHLORIDE 0.9 % IV SOLN
INTRAVENOUS | Status: DC
  Filled 2021-01-29: qty 250

## 2021-01-29 MED ORDER — HEPARIN SOD (PORK) LOCK FLUSH 100 UNIT/ML IV SOLN
500.0000 [IU] | Freq: Once | INTRAVENOUS | Status: AC | PRN
  Administered 2021-01-29: 500 [IU]
  Filled 2021-01-29: qty 5

## 2021-01-29 MED ORDER — ZOLEDRONIC ACID 4 MG/100ML IV SOLN
4.0000 mg | Freq: Once | INTRAVENOUS | Status: AC
  Administered 2021-01-29: 4 mg via INTRAVENOUS
  Filled 2021-01-29: qty 100

## 2021-01-29 NOTE — Progress Notes (Signed)
Patient here today for follow up regarding breast cancer. Patient reports improvement in lymphedema.

## 2021-01-29 NOTE — Patient Instructions (Addendum)
Zoledronic Acid Injection (Hypercalcemia, Oncology) What is this medicine? ZOLEDRONIC ACID (ZOE le dron ik AS id) slows calcium loss from bones. It high calcium levels in the blood from some kinds of cancer. It may be used in other people at risk for bone loss. This medicine may be used for other purposes; ask your health care provider or pharmacist if you have questions. COMMON BRAND NAME(S): Zometa What should I tell my health care provider before I take this medicine? They need to know if you have any of these conditions:  cancer  dehydration  dental disease  kidney disease  liver disease  low levels of calcium in the blood  lung or breathing disease (asthma)  receiving steroids like dexamethasone or prednisone  an unusual or allergic reaction to zoledronic acid, other medicines, foods, dyes, or preservatives  pregnant or trying to get pregnant  breast-feeding How should I use this medicine? This drug is injected into a vein. It is given by a health care provider in a hospital or clinic setting. Talk to your health care provider about the use of this drug in children. Special care may be needed. Overdosage: If you think you have taken too much of this medicine contact a poison control center or emergency room at once. NOTE: This medicine is only for you. Do not share this medicine with others. What if I miss a dose? Keep appointments for follow-up doses. It is important not to miss your dose. Call your health care provider if you are unable to keep an appointment. What may interact with this medicine?  certain antibiotics given by injection  NSAIDs, medicines for pain and inflammation, like ibuprofen or naproxen  some diuretics like bumetanide, furosemide  teriparatide  thalidomide This list may not describe all possible interactions. Give your health care provider a list of all the medicines, herbs, non-prescription drugs, or dietary supplements you use. Also tell  them if you smoke, drink alcohol, or use illegal drugs. Some items may interact with your medicine. What should I watch for while using this medicine? Visit your health care provider for regular checks on your progress. It may be some time before you see the benefit from this drug. Some people who take this drug have severe bone, joint, or muscle pain. This drug may also increase your risk for jaw problems or a broken thigh bone. Tell your health care provider right away if you have severe pain in your jaw, bones, joints, or muscles. Tell you health care provider if you have any pain that does not go away or that gets worse. Tell your dentist and dental surgeon that you are taking this drug. You should not have major dental surgery while on this drug. See your dentist to have a dental exam and fix any dental problems before starting this drug. Take good care of your teeth while on this drug. Make sure you see your dentist for regular follow-up appointments. You should make sure you get enough calcium and vitamin D while you are taking this drug. Discuss the foods you eat and the vitamins you take with your health care provider. Check with your health care provider if you have severe diarrhea, nausea, and vomiting, or if you sweat a lot. The loss of too much body fluid may make it dangerous for you to take this drug. You may need blood work done while you are taking this drug. Do not become pregnant while taking this drug. Women should inform their health care provider   if they wish to become pregnant or think they might be pregnant. There is potential for serious harm to an unborn child. Talk to your health care provider for more information. What side effects may I notice from receiving this medicine? Side effects that you should report to your doctor or health care provider as soon as possible:  allergic reactions (skin rash, itching or hives; swelling of the face, lips, or tongue)  bone  pain  infection (fever, chills, cough, sore throat, pain or trouble passing urine)  jaw pain, especially after dental work  joint pain  kidney injury (trouble passing urine or change in the amount of urine)  low blood pressure (dizziness; feeling faint or lightheaded, falls; unusually weak or tired)  low calcium levels (fast heartbeat; muscle cramps or pain; pain, tingling, or numbness in the hands or feet; seizures)  low magnesium levels (fast, irregular heartbeat; muscle cramp or pain; muscle weakness; tremors; seizures)  low red blood cell counts (trouble breathing; feeling faint; lightheaded, falls; unusually weak or tired)  muscle pain  redness, blistering, peeling, or loosening of the skin, including inside the mouth  severe diarrhea  swelling of the ankles, feet, hands  trouble breathing Side effects that usually do not require medical attention (report to your doctor or health care provider if they continue or are bothersome):  anxious  constipation  coughing  depressed mood  eye irritation, itching, or pain  fever  general ill feeling or flu-like symptoms  nausea  pain, redness, or irritation at site where injected  trouble sleeping This list may not describe all possible side effects. Call your doctor for medical advice about side effects. You may report side effects to FDA at 1-800-FDA-1088. Where should I keep my medicine? This drug is given in a hospital or clinic. It will not be stored at home. NOTE: This sheet is a summary. It may not cover all possible information. If you have questions about this medicine, talk to your doctor, pharmacist, or health care provider.  2021 [employer]vier/Gold Standard (2019-07-07 09:13:00)  

## 2021-01-30 LAB — CANCER ANTIGEN 27.29: CA 27.29: 66.3 U/mL — ABNORMAL HIGH (ref 0.0–38.6)

## 2021-01-31 ENCOUNTER — Ambulatory Visit: Payer: Medicare PPO | Admitting: Occupational Therapy

## 2021-01-31 DIAGNOSIS — I972 Postmastectomy lymphedema syndrome: Secondary | ICD-10-CM

## 2021-01-31 NOTE — Therapy (Signed)
[place] MAIN [facility] SERVICES [address] [place], [place], [address] Phone: [phone]   Fax:  [phone]  Occupational Therapy Treatment  Patient Details  Name: Kristy York MRN: [id number] Date of Birth: 1951-05-03 Referring Provider (OT): Delight Hoh, MD   Encounter Date: 01/31/2021   OT End of Session - 01/31/21 1515    Visit Number 6    Number of Visits 36    Date for OT Re-Evaluation 04/14/21    Authorization Type 16 visits initially authorized    OT Start Time 0309    OT Stop Time 0412    OT Time Calculation (min) 63 min    Activity Tolerance Patient tolerated treatment well;No increased pain           Past Medical History:  Diagnosis Date  . Anxiety   . Arthritis   . Diet-controlled diabetes mellitus ([place])    borderline  . Fatigue   . HLD (hyperlipidemia)   . 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 Metastasis to bone [facility])    Breast primary  . Murmur, cardiac   . Peripheral edema   . T wave inversion on electrocardiogram 05/17/2020   leads I, II, III, aVF, V3-V6    Past Surgical History:  Procedure Laterality Date  . BREAST BIOPSY Right 05/02/2020   [place] Bx, Q clip, path pending  . BREAST BIOPSY Right 05/02/2020   [place] Axilla Bx, path pending  . CHOLECYSTECTOMY    . COLONOSCOPY    . LAPAROSCOPIC ABDOMINAL EXPLORATION    . MASTECTOMY MODIFIED RADICAL Right 05/23/2020   Procedure: MASTECTOMY MODIFIED RADICAL, WITH EXCISION OF AXILLARY CONTENTS;  Surgeon: Robert Bellow, MD;  Location: [facility] ORS;  Service: General;  Laterality: Right;  . PORTACATH PLACEMENT Left 05/23/2020   Procedure: INSERTION PORT-A-CATH;  Surgeon: Robert Bellow, MD;  Location: [facility] ORS;  Service: General;  Laterality: Left;    There were no vitals filed for this visit.   Subjective Assessment - 01/31/21 1616    Subjective  Kristy York presents for OT visit 6/36 to address RUE/RUQ  post-mastectomy lymphedema. Pt is accompanied by her sister today. Pt denies LE related pain. She arrived with compression wraps in place.    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 anxiety, arthritis, diet controlled diabetes mellitus, fatique, , heart murmer, periferal edema in feet and legs,    Limitations decreased RUE shoulder AROM, chronic RUE swelling and associated pain, fatigue    Repetition Increases Symptoms    Special Tests _ Stemmer R MPs    Pain Onset 1 to 4 weeks ago   3 weeks ago                       OT Treatments/Exercises (OP) - 01/31/21 1617      ADLs   ADL Education Given Yes      Manual Therapy   Manual Therapy Edema management;Compression Bandaging;Manual Lymphatic Drainage (MLD);Soft tissue mobilization    Edema Management skin care throughout MLD with low ph castor oil , which matches skin ph. Castor oil used to limit infection, improve hydration and improve skin and tissue mobility.    Soft tissue mobilization scar  massage to chest wall    Manual Lymphatic Drainage (MLD) MLD to RUE/RUQ with emphasis on upper arm today.Next visit will focus on trunk and chest wall directing lymphatic congestion alternatively to adjacent L axilla and R inguinal anastamosis.    Compression Bandaging multi layer compression wraps using gradient technique. Applied fingerbandages leaving palm free. Applied single layer of 0.4 cm thick Rosidal foam over cotton stockinett, then short stretch wraps ( 10 cm ) in gradient conmfiguration to axilla.                  OT Education - 01/31/21 1619    Education Details Continued Pt/ CG edu for lymphedema self care  and home program throughout session. Topics include multilayer, gradient  compression wrapping, simple self-MLD, therapeutic lymphatic pumping exercises, skin/nail care, risk reduction factors and LE precautions, compression garments/recommendations and wear and care schedule and compression garment donning / doffing using assistive devices. All questions answered to the Pt's satisfaction, and Pt demonstrates understanding by report.    Person(s) Educated Child(ren);Patient   Kristy York   Methods Explanation;Demonstration;Handout    Comprehension Verbalized understanding;Need further instruction               OT Long Term Goals - 01/15/21 1719      OT LONG TERM GOAL #1   Title Given this patient's risk-adjustment variables, her Intake Functional Status score of 61/100 on the FOTO tool, patient will experience at least an increase in function of 11 points for a score of 72/100.    Baseline 61/100    Time 12    Period Weeks    Status New    Target Date 04/14/21      OT LONG TERM GOAL #2   Title Pt will be able to apply multi-layer, short stretch compression wraps using correct gradient techniques with max caregiver assistance to return affected R limb to premorbid size and shape, to limit pain and infection risk, and to improve functional arm and hand use for ADLs.    Baseline dependent    Time 4    Period Days    Status New    Target Date --   4th OT Rx visit     OT LONG TERM GOAL #3   Title With Max  CG Assist during visit intervals Pt  will achieve at least a 10%  limb volume reduction  in the R arm and hand during Intensive Phase CDT to achieve optimal limb volume reduction, to prevent re-accumulation of lymphatic congestion and progression of fibrosis, to limit infection risk, to improve functional arm/hand use, to improve functional performance of basic and instrumental ADLs, and to limit LE progression.    Baseline dependent    Time 12    Period Weeks    Status New    Target Date 04/14/21      OT LONG TERM GOAL #4   Title With Max daily CG  assistance Pt will achieve and sustain a least 85% compliance performing all daily LE self-care home program components throughout Intensive Phase CDT, including recommended skin care regime, lymphatic pumping ther ex, 23/7 compression wraps and simple self-MLD, to ensure optimal limb volume reduction, to limit infection risk and to limit LE progression.    Baseline dependent    Time 12    Period Weeks    Status New    Target Date 04/14/21      OT LONG TERM GOAL #5   Title By DC from OT Pt will  be able to don and doff appropriate daytime compression garments and HOS devices with Mod CG assistance to limit lymphatic re-accumulation and LE progression with before transitioning to self-management phase of CDT.    Baseline Max A    Time 12    Period Weeks    Status New    Target Date 04/14/21      Long Term Additional Goals   Additional Long Term Goals Yes      OT LONG TERM GOAL #6   Title Pt will regain full RUE AROM  for flexion and abduction essential for optimal functional performance of basic and instrumental ADLs, productive activities,  and leisure pursuits by DC from OT.    Baseline Max A    Time 12    Period Weeks    Status New    Target Date 04/14/21                 Plan - 01/31/21 1614    Clinical Impression Statement RUE continues to decongest. Proximal arm is noticably less swollen today than at last visit, and forearm is more pliable and less doughy. Patient tolerated MLD, skin care and gradient compression wrapping without increased pain. Complete volumetrics on visit 10. Cont as per POC.    OT Occupational Profile and History Comprehensive Assessment- Review of records and extensive additional review of physical, cognitive, psychosocial history related to current functional performance    Occupational performance deficits (Please refer to evaluation for details): ADL's;IADL's;Leisure;Work;Social Participation;Rest and Sleep    Body Structure / Function / Physical  Skills ADL;Decreased knowledge of use of DME;Pain;Edema;UE functional use;IADL;ROM;Fascial restriction;Scar mobility;Skin integrity;Decreased knowledge of precautions    Rehab Potential Good    Clinical Decision Making Several treatment options, min-mod task modification necessary    Comorbidities Affecting Occupational Performance: Presence of comorbidities impacting occupational performance    Modification or Assistance to Complete Evaluation  Min-Moderate modification of tasks or assist with assess necessary to complete eval    OT Frequency 2x / week    OT Duration 12 weeks   and PRN for follow along support and garment replacement   OT Treatment/Interventions Self-care/ADL training;DME and/or AE instruction;Manual lymph drainage;Therapeutic activities;Compression bandaging;Therapeutic exercise;Scar mobilization;Coping strategies training;Manual Therapy;Patient/family education    Plan Complete Decongestive Therapy to RUE/RUQ to include manual lymphatic drainage a(MLD), skin care with low ph lotion/ castor oil , therapeutic lymphatic pumping exercise, compression wraps graduating to compression garments for daytime and device for HOS PRN. Will also incorporate kinesiotape, myofascial release PRN, scar massage PRN    Recommended Other Services fit with comfortable and effective gradient comptression garments. It;s likely Pt will need custom sleeve and glove, ccl 2 flat knit    Consulted and Agree with Plan of Care Patient           Patient will benefit from skilled therapeutic intervention in order to improve the following deficits and impairments:   Body Structure / Function / Physical Skills: ADL,Decreased knowledge of use of DME,Pain,Edema,UE functional use,IADL,ROM,Fascial restriction,Scar mobility,Skin integrity,Decreased knowledge of precautions       Visit Diagnosis: Postmastectomy lymphedema syndrome    Problem List Patient Active Problem List   Diagnosis Date Noted  . Goals  of care, counseling/discussion 05/17/2020  . Breast cancer, right ([place]) 05/08/2020    Andrey Spearman, MS, OTR/L, [facility] 01/31/21 4:20 PM   [place] MAIN [facility] SERVICES [address] [place], [place], [address] Phone: [phone]   Fax:  [phone]  Name: Ajeenah  Hildebrant MRN: [id number] Date of Birth: [birth year]

## 2021-02-04 ENCOUNTER — Other Ambulatory Visit: Payer: Self-pay

## 2021-02-04 ENCOUNTER — Ambulatory Visit: Payer: Medicare PPO | Attending: Oncology | Admitting: Occupational Therapy

## 2021-02-04 DIAGNOSIS — I972 Postmastectomy lymphedema syndrome: Secondary | ICD-10-CM | POA: Diagnosis present

## 2021-02-05 ENCOUNTER — Ambulatory Visit: Payer: Medicare PPO | Admitting: Occupational Therapy

## 2021-02-08 ENCOUNTER — Ambulatory Visit: Payer: Medicare PPO | Admitting: Occupational Therapy

## 2021-02-08 ENCOUNTER — Other Ambulatory Visit: Payer: Self-pay

## 2021-02-08 DIAGNOSIS — I972 Postmastectomy lymphedema syndrome: Secondary | ICD-10-CM | POA: Diagnosis not present

## 2021-02-08 NOTE — Therapy (Signed)
[place] MAIN [facility] SERVICES [address] [place], [place], [address] Phone: [phone]   Fax:  [phone]  Occupational Therapy Treatment  Patient Details  Name: Annastyn Silvey MRN: [id number] Date of Birth: 12-Apr-1951 Referring Provider (OT): Delight Hoh, MD   Encounter Date: 02/08/2021   OT End of Session - 02/08/21 1112    Visit Number 8    Number of Visits 36    Date for OT Re-Evaluation 04/14/21    Authorization Type 16 visits initially authorized    OT Start Time 1108    OT Stop Time 1203    OT Time Calculation (min) 55 min    Activity Tolerance Patient tolerated treatment well;No increased pain           Past Medical History:  Diagnosis Date  . Anxiety   . Arthritis   . Diet-controlled diabetes mellitus ([place])    borderline  . Fatigue   . HLD (hyperlipidemia)   . 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 Metastasis to bone [facility])    Breast primary  . Murmur, cardiac   . Peripheral edema   . T wave inversion on electrocardiogram 05/17/2020   leads I, II, III, aVF, V3-V6    Past Surgical History:  Procedure Laterality Date  . BREAST BIOPSY Right 05/02/2020   [place] Bx, Q clip, path pending  . BREAST BIOPSY Right 05/02/2020   [place] Axilla Bx, path pending  . CHOLECYSTECTOMY    . COLONOSCOPY    . LAPAROSCOPIC ABDOMINAL EXPLORATION    . MASTECTOMY MODIFIED RADICAL Right 05/23/2020   Procedure: MASTECTOMY MODIFIED RADICAL, WITH EXCISION OF AXILLARY CONTENTS;  Surgeon: Robert Bellow, MD;  Location: [facility] ORS;  Service: General;  Laterality: Right;  . PORTACATH PLACEMENT Left 05/23/2020   Procedure: INSERTION PORT-A-CATH;  Surgeon: Robert Bellow, MD;  Location: [facility] ORS;  Service: General;  Laterality: Left;    There were no vitals filed for this visit.   Subjective Assessment - 02/08/21 1113    Subjective  Shahida Schnackenberg presents for OT visit 8/36 to address RUE/RUQ  post-mastectomy lymphedema. Pt is accompanied by her son, Paulita Fujita.  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 anxiety, arthritis, diet controlled diabetes mellitus, fatique, , heart murmer, periferal edema in feet and legs,    Limitations decreased RUE shoulder AROM, chronic RUE swelling and associated pain, fatigue    Repetition Increases Symptoms    Special Tests _ Stemmer R MPs    Pain Onset 1 to 4 weeks ago   3 weeks ago              LYMPHEDEMA/ONCOLOGY QUESTIONNAIRE - 02/08/21 1219      Right Upper Extremity Lymphedema   Other RUE limb volume  2851.7 ml.    Other RLE limb volume is decreased by 12.4% since initially measured on OT Rx day 1 (01/18/21). Limb volume differential (LVD) measures 14.5%, R>L                   OT Treatments/Exercises (OP) - 02/08/21 1218      ADLs   ADL Education Given Yes  Manual Therapy   Manual Therapy Edema management    Manual therapy comments RLE comparative limb volumetrics    Edema Management skin care throughout MLD with low ph castor oil , which matches skin ph. Castor oil used to limit infection, improve hydration and improve skin and tissue mobility.    Soft tissue mobilization scar massage to chest wall    Manual Lymphatic Drainage (MLD) MLD to RUE/RUQ with emphasis on upper arm today.Next visit will focus on trunk and chest wall directing lymphatic congestion alternatively to adjacent L axilla and R inguinal anastamosis.    Compression Bandaging multi layer compression wraps using gradient technique. Applied fingerbandages leaving palm free. Applied single layer of 0.4 cm thick Rosidal foam over cotton stockinett, then short stretch wraps ( 10 cm )  in gradient conmfiguration to axilla.                  OT Education - 02/08/21 1217    Education Details Continued Pt/ CG edu for lymphedema self care  and home program throughout session. Topics include multilayer, gradient compression wrapping, simple self-MLD, therapeutic lymphatic pumping exercises, skin/nail care, risk reduction factors and LE precautions, compression garments/recommendations and wear and care schedule and compression garment donning / doffing using assistive devices. All questions answered to the Pt's satisfaction, and Pt demonstrates understanding by report.    Person(s) Educated Child(ren);Patient   [name]   Methods Explanation;Demonstration;Handout    Comprehension Verbalized understanding;Need further instruction               OT Long Term Goals - 01/15/21 1719      OT LONG TERM GOAL #1   Title Given this patient's risk-adjustment variables, her Intake Functional Status score of 61/100 on the FOTO tool, patient will experience at least an increase in function of 11 points for a score of 72/100.    Baseline 61/100    Time 12    Period Weeks    Status New    Target Date 04/14/21      OT LONG TERM GOAL #2   Title Pt will be able to apply multi-layer, short stretch compression wraps using correct gradient techniques with max caregiver assistance to return affected R limb to premorbid size and shape, to limit pain and infection risk, and to improve functional arm and hand use for ADLs.    Baseline dependent    Time 4    Period Days    Status New    Target Date --   4th OT Rx visit     OT LONG TERM GOAL #3   Title With Max  CG Assist during visit intervals Pt  will achieve at least a 10%  limb volume reduction  in the R arm and hand during Intensive Phase CDT to achieve optimal limb volume reduction, to prevent re-accumulation of lymphatic congestion and progression of fibrosis, to limit infection risk, to improve functional arm/hand use, to improve  functional performance of basic and instrumental ADLs, and to limit LE progression.    Baseline dependent    Time 12    Period Weeks    Status New    Target Date 04/14/21      OT LONG TERM GOAL #4   Title With Max daily CG assistance Pt will achieve and sustain a least 85% compliance performing all daily LE self-care home program components throughout Intensive Phase CDT, including recommended skin care regime, lymphatic pumping ther ex, 23/7 compression wraps and simple self-MLD, to ensure  optimal limb volume reduction, to limit infection risk and to limit LE progression.    Baseline dependent    Time 12    Period Weeks    Status New    Target Date 04/14/21      OT LONG TERM GOAL #5   Title By DC from OT Pt will be able to don and doff appropriate daytime compression garments and HOS devices with Mod CG assistance to limit lymphatic re-accumulation and LE progression with before transitioning to self-management phase of CDT.    Baseline Max A    Time 12    Period Weeks    Status New    Target Date 04/14/21      Long Term Additional Goals   Additional Long Term Goals Yes      OT LONG TERM GOAL #6   Title Pt will regain full RUE AROM  for flexion and abduction essential for optimal functional performance of basic and instrumental ADLs, productive activities,  and leisure pursuits by DC from OT.    Baseline Max A    Time 12    Period Weeks    Status New    Target Date 04/14/21                 Plan - 02/08/21 1222    Clinical Impression Statement Completed comparative limb volumetrics for RUE (Rx/ dominant) today, which reveal RLE limb volume is decreased by 12.4% since initially measured on OT Rx day 1 (01/18/21). Limb volume differential (LVD) measures 14.5%, R>L. The % value meets and exceeds the 10% volume reduction goal. But because LVD is still well above typical at 14.5% we'll continue with CDT until volumes are closer in symmetry. Pt pleased with results thus far.  Family caregivers remain diligent and supportive with compression wraps between sessions. Great progress towards goals. Cont as per POC.    OT Occupational Profile and History Comprehensive Assessment- Review of records and extensive additional review of physical, cognitive, psychosocial history related to current functional performance    Occupational performance deficits (Please refer to evaluation for details): ADL's;IADL's;Leisure;Work;Social Participation;Rest and Sleep    Body Structure / Function / Physical Skills ADL;Decreased knowledge of use of DME;Pain;Edema;UE functional use;IADL;ROM;Fascial restriction;Scar mobility;Skin integrity;Decreased knowledge of precautions    Rehab Potential Good    Clinical Decision Making Several treatment options, min-mod task modification necessary    Comorbidities Affecting Occupational Performance: Presence of comorbidities impacting occupational performance    Modification or Assistance to Complete Evaluation  Min-Moderate modification of tasks or assist with assess necessary to complete eval    OT Frequency 2x / week    OT Duration 12 weeks   and PRN for follow along support and garment replacement   OT Treatment/Interventions Self-care/ADL training;DME and/or AE instruction;Manual lymph drainage;Therapeutic activities;Compression bandaging;Therapeutic exercise;Scar mobilization;Coping strategies training;Manual Therapy;Patient/family education    Plan Complete Decongestive Therapy to RUE/RUQ to include manual lymphatic drainage a(MLD), skin care with low ph lotion/ castor oil , therapeutic lymphatic pumping exercise, compression wraps graduating to compression garments for daytime and device for HOS PRN. Will also incorporate kinesiotape, myofascial release PRN, scar massage PRN    Recommended Other Services fit with comfortable and effective gradient comptression garments. It;s likely Pt will need custom sleeve and glove, ccl 2 flat knit    Consulted and  Agree with Plan of Care Patient           Patient will benefit from skilled therapeutic intervention in order to improve the following  deficits and impairments:   Body Structure / Function / Physical Skills: ADL,Decreased knowledge of use of DME,Pain,Edema,UE functional use,IADL,ROM,Fascial restriction,Scar mobility,Skin integrity,Decreased knowledge of precautions       Visit Diagnosis: Postmastectomy lymphedema syndrome    Problem List Patient Active Problem List   Diagnosis Date Noted  . Goals of care, counseling/discussion 05/17/2020  . Breast cancer, right ([place]) 05/08/2020    Andrey Spearman, MS, OTR/L, [facility] 02/08/21 12:27 PM   [place] MAIN [facility] SERVICES [address] [place], [place], [address] Phone: [phone]   Fax:  [phone]  Name: Mariaclara Spear MRN: [id number] Date of Birth: 20-Feb-1951

## 2021-02-14 NOTE — Therapy (Signed)
[place] MAIN [facility] SERVICES [address] [place], [place], [address] Phone: [phone]   Fax:  [phone]  Occupational Therapy Treatment  Patient Details  Name: Kristy York MRN: [id number] Date of Birth: March 27, 1951 Referring Provider (OT): Delight Hoh, MD   Encounter Date: 02/04/2021   OT End of Session - 02/14/21 1102    Visit Number 7    Number of Visits 36    Date for OT Re-Evaluation 04/14/21    Authorization Type 16 visits initially authorized    OT Start Time 0300    OT Stop Time 0400    OT Time Calculation (min) 60 min    Activity Tolerance Patient tolerated treatment well;No increased pain    Behavior During Therapy WFL for tasks assessed/performed           Past Medical History:  Diagnosis Date  . Anxiety   . Arthritis   . Diet-controlled diabetes mellitus ([place])    borderline  . Fatigue   . HLD (hyperlipidemia)   . Invasive carcinoma of breast ([place]) 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; ER/PR (+), HER2/neu (-)  . Metastasis to bone [facility])    Breast primary  . Murmur, cardiac   . Peripheral edema   . T wave inversion on electrocardiogram 05/17/2020   leads I, II, III, aVF, V3-V6    Past Surgical History:  Procedure Laterality Date  . BREAST BIOPSY Right 05/02/2020   [place] Bx, Q clip, path pending  . BREAST BIOPSY Right 05/02/2020   [place] Axilla Bx, path pending  . CHOLECYSTECTOMY    . COLONOSCOPY    . LAPAROSCOPIC ABDOMINAL EXPLORATION    . MASTECTOMY MODIFIED RADICAL Right 05/23/2020   Procedure: MASTECTOMY MODIFIED RADICAL, WITH EXCISION OF AXILLARY CONTENTS;  Surgeon: Robert Bellow, MD;  Location: [facility] ORS;  Service: General;  Laterality: Right;  . PORTACATH PLACEMENT Left 05/23/2020   Procedure: INSERTION PORT-A-CATH;  Surgeon: Robert Bellow, MD;  Location: [facility] ORS;  Service: General;  Laterality: Left;    There were no vitals filed for this visit.   Subjective Assessment - 02/14/21 1058    Subjective   Kristy York presents for OT visit 7/36 to address RUE/RUQ post-mastectomy lymphedema. Pt is accompanied by her sister.  Pt denies LE related pain. She reports wrap has been in place since last visit 4 days ago b/c her son has been sick and unable to assist. We'll train the sister today so she can be a back up. Sisiter is supportive and willing to assist.    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 anxiety, arthritis, diet controlled diabetes mellitus, fatique, , heart murmer, periferal edema in feet and legs,    Limitations decreased RUE shoulder AROM, chronic RUE swelling and associated pain, fatigue    Repetition Increases Symptoms    Special Tests _ Stemmer R MPs    Pain Onset 1 to 4 weeks ago   3 weeks ago                       OT Treatments/Exercises (OP) - 02/14/21 0001      ADLs   ADL Education Given Yes      Manual Therapy   Manual Therapy Edema management  Edema Management skin care throughout MLD with low ph castor oil , which matches skin ph. Castor oil used to limit infection, improve hydration and improve skin and tissue mobility.    Soft tissue mobilization scar massage to chest wall    Manual Lymphatic Drainage (MLD) MLD to RUE/RUQ with emphasis on upper arm today.Next visit will focus on trunk and chest wall directing lymphatic congestion alternatively to adjacent L axilla and R inguinal anastamosis.    Compression Bandaging multi layer compression wraps using gradient technique. Applied fingerbandages leaving palm free. Applied single layer of 0.4 cm thick Rosidal foam over cotton stockinett, then short stretch wraps ( 10 cm ) in gradient conmfiguration to axilla.                  OT Education - 02/14/21 1103     Education Details Continued Pt/ CG edu for lymphedema self care  and home program throughout session. Topics include multilayer, gradient compression wrapping, simple self-MLD, therapeutic lymphatic pumping exercises, skin/nail care, risk reduction factors and LE precautions, compression garments/recommendations and wear and care schedule and compression garment donning / doffing using assistive devices. All questions answered to the Pt's satisfaction, and Pt demonstrates understanding by report.    Person(s) Educated Child(ren);Patient   Kristy York   Methods Explanation;Demonstration;Handout    Comprehension Verbalized understanding;Need further instruction               OT Long Term Goals - 01/15/21 1719      OT LONG TERM GOAL #1   Title Given this patient's risk-adjustment variables, her Intake Functional Status score of 61/100 on the FOTO tool, patient will experience at least an increase in function of 11 points for a score of 72/100.    Baseline 61/100    Time 12    Period Weeks    Status New    Target Date 04/14/21      OT LONG TERM GOAL #2   Title Pt will be able to apply multi-layer, short stretch compression wraps using correct gradient techniques with max caregiver assistance to return affected R limb to premorbid size and shape, to limit pain and infection risk, and to improve functional arm and hand use for ADLs.    Baseline dependent    Time 4    Period Days    Status New    Target Date --   4th OT Rx visit     OT LONG TERM GOAL #3   Title With Max  CG Assist during visit intervals Pt  will achieve at least a 10%  limb volume reduction  in the R arm and hand during Intensive Phase CDT to achieve optimal limb volume reduction, to prevent re-accumulation of lymphatic congestion and progression of fibrosis, to limit infection risk, to improve functional arm/hand use, to improve functional performance of basic and instrumental ADLs, and to limit LE progression.    Baseline  dependent    Time 12    Period Weeks    Status New    Target Date 04/14/21      OT LONG TERM GOAL #4   Title With Max daily CG assistance Pt will achieve and sustain a least 85% compliance performing all daily LE self-care home program components throughout Intensive Phase CDT, including recommended skin care regime, lymphatic pumping ther ex, 23/7 compression wraps and simple self-MLD, to ensure optimal limb volume reduction, to limit infection risk and to limit LE progression.    Baseline dependent  Time 12    Period Weeks    Status New    Target Date 04/14/21      OT LONG TERM GOAL #5   Title By DC from OT Pt will be able to don and doff appropriate daytime compression garments and HOS devices with Mod CG assistance to limit lymphatic re-accumulation and LE progression with before transitioning to self-management phase of CDT.    Baseline Max A    Time 12    Period Weeks    Status New    Target Date 04/14/21      Long Term Additional Goals   Additional Long Term Goals Yes      OT LONG TERM GOAL #6   Title Pt will regain full RUE AROM  for flexion and abduction essential for optimal functional performance of basic and instrumental ADLs, productive activities,  and leisure pursuits by DC from OT.    Baseline Max A    Time 12    Period Weeks    Status New    Target Date 04/14/21                 Plan - 02/14/21 1102    Clinical Impression Statement Pt tolerated MLD, skin care and multilayer compression wrapping as established. Limb volume continues to decrease and Pt's son continues to diligently assist her between visits. Cont as per POC.    OT Occupational Profile and History Comprehensive Assessment- Review of records and extensive additional review of physical, cognitive, psychosocial history related to current functional performance    Occupational performance deficits (Please refer to evaluation for details): ADL's;IADL's;Leisure;Work;Social Participation;Rest and  Sleep    Body Structure / Function / Physical Skills ADL;Decreased knowledge of use of DME;Pain;Edema;UE functional use;IADL;ROM;Fascial restriction;Scar mobility;Skin integrity;Decreased knowledge of precautions    Rehab Potential Good    Clinical Decision Making Several treatment options, min-mod task modification necessary    Comorbidities Affecting Occupational Performance: Presence of comorbidities impacting occupational performance    Modification or Assistance to Complete Evaluation  Min-Moderate modification of tasks or assist with assess necessary to complete eval    OT Frequency 2x / week    OT Duration 12 weeks   and PRN for follow along support and garment replacement   OT Treatment/Interventions Self-care/ADL training;DME and/or AE instruction;Manual lymph drainage;Therapeutic activities;Compression bandaging;Therapeutic exercise;Scar mobilization;Coping strategies training;Manual Therapy;Patient/family education    Plan Complete Decongestive Therapy to RUE/RUQ to include manual lymphatic drainage a(MLD), skin care with low ph lotion/ castor oil , therapeutic lymphatic pumping exercise, compression wraps graduating to compression garments for daytime and device for HOS PRN. Will also incorporate kinesiotape, myofascial release PRN, scar massage PRN    Recommended Other Services fit with comfortable and effective gradient comptression garments. It;s likely Pt will need custom sleeve and glove, ccl 2 flat knit    Consulted and Agree with Plan of Care Patient           Patient will benefit from skilled therapeutic intervention in order to improve the following deficits and impairments:   Body Structure / Function / Physical Skills: ADL,Decreased knowledge of use of DME,Pain,Edema,UE functional use,IADL,ROM,Fascial restriction,Scar mobility,Skin integrity,Decreased knowledge of precautions       Visit Diagnosis: Postmastectomy lymphedema syndrome    Problem List Patient Active  Problem List   Diagnosis Date Noted  . Goals of care, counseling/discussion 05/17/2020  . Breast cancer, right ([place]) 05/08/2020    Ansel Bong 02/14/2021, 11:05 AM  [place]  MAIN [facility] SERVICES [place], [place], [address] Phone: [phone]   Fax:  [phone]  Name: Kimberlyann Hollar MRN: [id number] Date of Birth: Oct 18, 1950

## 2021-02-14 NOTE — Therapy (Deleted)
[place] MAIN [facility] SERVICES [address] [place], [place], [address] Phone: [phone]   Fax:  [phone]  Occupational Therapy Treatment  Patient Details  Name: Kristy York MRN: [id number] Date of Birth: [birth year] Referring Provider (OT): Delight Hoh, MD   Encounter Date: 02/04/2021    Past Medical History:  Diagnosis Date  . Anxiety   . Arthritis   . Diet-controlled diabetes mellitus ([place])    borderline  . Fatigue   . HLD (hyperlipidemia)   . 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 Metastasis to bone [facility])    Breast primary  . Murmur, cardiac   . Peripheral edema   . T wave inversion on electrocardiogram 05/17/2020   leads I, II, III, aVF, V3-V6    Past Surgical History:  Procedure Laterality Date  . BREAST BIOPSY Right 05/02/2020   [place] Bx, Q clip, path pending  . BREAST BIOPSY Right 05/02/2020   [place] Axilla Bx, path pending  . CHOLECYSTECTOMY    . COLONOSCOPY    . LAPAROSCOPIC ABDOMINAL EXPLORATION    . MASTECTOMY MODIFIED RADICAL Right 05/23/2020   Procedure: MASTECTOMY MODIFIED RADICAL, WITH EXCISION OF AXILLARY CONTENTS;  Surgeon: Robert Bellow, MD;  Location: [facility] ORS;  Service: General;  Laterality: Right;  . PORTACATH PLACEMENT Left 05/23/2020   Procedure: INSERTION PORT-A-CATH;  Surgeon: Robert Bellow, MD;  Location: [facility] ORS;  Service: General;  Laterality: Left;    There were no vitals filed for this visit.   Subjective Assessment - 02/14/21 1058    Subjective  Kristy York presents for OT visit 7/36 to address RUE/RUQ post-mastectomy lymphedema. Pt is accompanied by her sister.  Pt denies LE related pain. She reports wrap has been in place since last visit 4 days ago b/c her son has been sick and unable to assist. We'll train the sister today so she can be a back up. Sisiter is supportive and willing to assist.    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 anxiety, arthritis, diet controlled diabetes mellitus, fatique, , heart murmer, periferal edema in feet and legs,    Limitations decreased RUE shoulder AROM, chronic RUE swelling and associated pain, fatigue    Repetition Increases Symptoms    Special Tests _ Stemmer R MPs    Pain Onset 1 to 4 weeks ago   3 weeks ago                                    OT Long Term Goals - 01/15/21 1719      OT LONG TERM GOAL #1   Title Given this patient's risk-adjustment variables, her Intake Functional Status score of 61/100 on the FOTO tool, patient will experience at least an increase in function of 11 points for a score of 72/100.    Baseline 61/100    Time 12    Period Weeks    Status New    Target Date 04/14/21      OT LONG TERM GOAL #2   Title Pt will be able to apply multi-layer, short stretch compression wraps using correct gradient techniques with max  caregiver assistance to return affected R limb to premorbid size and shape, to limit pain and infection risk, and to improve functional arm and hand use for ADLs.    Baseline dependent    Time 4    Period Days    Status New    Target Date --   4th OT Rx visit     OT LONG TERM GOAL #3   Title With Max  CG Assist during visit intervals Pt  will achieve at least a 10%  limb volume reduction  in the R arm and hand during Intensive Phase CDT to achieve optimal limb volume reduction, to prevent re-accumulation of lymphatic congestion and progression of fibrosis, to limit infection risk, to improve functional arm/hand use, to improve functional performance of basic and instrumental ADLs, and to limit LE progression.    Baseline dependent    Time 12    Period Weeks    Status New    Target  Date 04/14/21      OT LONG TERM GOAL #4   Title With Max daily CG assistance Pt will achieve and sustain a least 85% compliance performing all daily LE self-care home program components throughout Intensive Phase CDT, including recommended skin care regime, lymphatic pumping ther ex, 23/7 compression wraps and simple self-MLD, to ensure optimal limb volume reduction, to limit infection risk and to limit LE progression.    Baseline dependent    Time 12    Period Weeks    Status New    Target Date 04/14/21      OT LONG TERM GOAL #5   Title By DC from OT Pt will be able to don and doff appropriate daytime compression garments and HOS devices with Mod CG assistance to limit lymphatic re-accumulation and LE progression with before transitioning to self-management phase of CDT.    Baseline Max A    Time 12    Period Weeks    Status New    Target Date 04/14/21      Long Term Additional Goals   Additional Long Term Goals Yes      OT LONG TERM GOAL #6   Title Pt will regain full RUE AROM  for flexion and abduction essential for optimal functional performance of basic and instrumental ADLs, productive activities,  and leisure pursuits by DC from OT.    Baseline Max A    Time 12    Period Weeks    Status New    Target Date 04/14/21                  Patient will benefit from skilled therapeutic intervention in order to improve the following deficits and impairments:   Body Structure / Function / Physical Skills: ADL,Decreased knowledge of use of DME,Pain,Edema,UE functional use,IADL,ROM,Fascial restriction,Scar mobility,Skin integrity,Decreased knowledge of precautions       Visit Diagnosis: Postmastectomy lymphedema syndrome    Problem List Patient Active Problem List   Diagnosis Date Noted  . Goals of care, counseling/discussion 05/17/2020  . Breast cancer, right ([place]) 05/08/2020    Ansel Bong 02/14/2021, 11:00 AM  [place]  MAIN [facility] SERVICES [address] [place], [place], [address] Phone: [phone]   Fax:  [phone]  Name: Kristy York MRN: [id number] Date of Birth: 1951-02-21

## 2021-02-15 ENCOUNTER — Ambulatory Visit: Payer: Medicare PPO | Admitting: Occupational Therapy

## 2021-02-15 ENCOUNTER — Other Ambulatory Visit: Payer: Self-pay

## 2021-02-15 DIAGNOSIS — I972 Postmastectomy lymphedema syndrome: Secondary | ICD-10-CM

## 2021-02-15 NOTE — Therapy (Signed)
[place] MAIN [facility] SERVICES [address] [place], [place], [address] Phone: [phone]   Fax:  [phone]  Occupational Therapy Treatment  Patient Details  Name: Kristy York MRN: [id number] Date of Birth: 1951/09/16 Referring Provider (OT): Delight Hoh, MD   Encounter Date: 02/15/2021   OT End of Session - 02/15/21 1118    Visit Number 8    Number of Visits 36    Date for OT Re-Evaluation 04/14/21    Authorization Type 16 visits initially authorized    OT Start Time 1114    OT Stop Time 1214    OT Time Calculation (min) 60 min    Activity Tolerance Patient tolerated treatment well;No increased pain    Behavior During Therapy WFL for tasks assessed/performed           Past Medical History:  Diagnosis Date  . Anxiety   . Arthritis   . Diet-controlled diabetes mellitus ([place])    borderline  . Fatigue   . HLD (hyperlipidemia)   . Invasive carcinoma of breast ([place]) 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; ER/PR (+), HER2/neu (-)  . Metastasis to bone [facility])    Breast primary  . Murmur, cardiac   . Peripheral edema   . T wave inversion on electrocardiogram 05/17/2020   leads I, II, III, aVF, V3-V6    Past Surgical History:  Procedure Laterality Date  . BREAST BIOPSY Right 05/02/2020   [place] Bx, Q clip, path pending  . BREAST BIOPSY Right 05/02/2020   [place] Axilla Bx, path pending  . CHOLECYSTECTOMY    . COLONOSCOPY    . LAPAROSCOPIC ABDOMINAL EXPLORATION    . MASTECTOMY MODIFIED RADICAL Right 05/23/2020   Procedure: MASTECTOMY MODIFIED RADICAL, WITH EXCISION OF AXILLARY CONTENTS;  Surgeon: Robert Bellow, MD;  Location: [facility] ORS;  Service: General;  Laterality: Right;  . PORTACATH PLACEMENT Left 05/23/2020   Procedure: INSERTION PORT-A-CATH;  Surgeon: Robert Bellow, MD;  Location: [facility] ORS;  Service: General;  Laterality: Left;    There were no vitals filed for this visit.   Subjective Assessment - 02/15/21 1119    Subjective   Kristy York presents for OT visit 8/36 to address RUE/RUQ post-mastectomy lymphedema. Pt is unaccompanied today. Pt reports 2/10 in LE-related RUE.    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 anxiety, arthritis, diet controlled diabetes mellitus, fatique, , heart murmer, periferal edema in feet and legs,    Limitations decreased RUE shoulder AROM, chronic RUE swelling and associated pain, fatigue    Repetition Increases Symptoms    Special Tests _ Stemmer R MPs    Pain Onset 1 to 4 weeks ago   3 weeks ago                       OT Treatments/Exercises (OP) - 02/15/21 1122      ADLs   ADL Education Given Yes (P)       Manual Therapy   Manual Therapy Edema management;Manual Lymphatic Drainage (MLD);Compression Bandaging (P)     Edema Management skin care throughout MLD with low ph castor oil , which matches skin ph. Castor oil used to limit infection, improve hydration and improve skin and tissue mobility. (P)  Soft tissue mobilization scar massage to chest wall (P)     Manual Lymphatic Drainage (MLD) MLD to RUE/RUQ with emphasis on upper arm today.Next visit will focus on trunk and chest wall directing lymphatic congestion alternatively to adjacent L axilla and R inguinal anastamosis. (P)     Compression Bandaging multi layer compression wraps using gradient technique. Applied fingerbandages leaving palm free. Applied single layer of 0.4 cm thick Rosidal foam over cotton stockinett, then short stretch wraps ( 10 cm ) in gradient conmfiguration to axilla. (P)                   OT Education - 02/15/21 1250    Education Details Continued Pt/ CG edu for lymphedema self care  and home program throughout session. Topics include multilayer,  gradient compression wrapping, simple self-MLD, therapeutic lymphatic pumping exercises, skin/nail care, risk reduction factors and LE precautions, compression garments/recommendations and wear and care schedule and compression garment donning / doffing using assistive devices. All questions answered to the Pt's satisfaction, and Pt demonstrates understanding by report.    Person(s) Educated Child(ren);Patient   [name]   Methods Explanation;Demonstration;Handout    Comprehension Verbalized understanding;Need further instruction               OT Long Term Goals - 01/15/21 1719      OT LONG TERM GOAL #1   Title Given this patient's risk-adjustment variables, her Intake Functional Status score of 61/100 on the FOTO tool, patient will experience at least an increase in function of 11 points for a score of 72/100.    Baseline 61/100    Time 12    Period Weeks    Status New    Target Date 04/14/21      OT LONG TERM GOAL #2   Title Pt will be able to apply multi-layer, short stretch compression wraps using correct gradient techniques with max caregiver assistance to return affected R limb to premorbid size and shape, to limit pain and infection risk, and to improve functional arm and hand use for ADLs.    Baseline dependent    Time 4    Period Days    Status New    Target Date --   4th OT Rx visit     OT LONG TERM GOAL #3   Title With Max  CG Assist during visit intervals Pt  will achieve at least a 10%  limb volume reduction  in the R arm and hand during Intensive Phase CDT to achieve optimal limb volume reduction, to prevent re-accumulation of lymphatic congestion and progression of fibrosis, to limit infection risk, to improve functional arm/hand use, to improve functional performance of basic and instrumental ADLs, and to limit LE progression.    Baseline dependent    Time 12    Period Weeks    Status New    Target Date 04/14/21      OT LONG TERM GOAL #4   Title With Max daily CG  assistance Pt will achieve and sustain a least 85% compliance performing all daily LE self-care home program components throughout Intensive Phase CDT, including recommended skin care regime, lymphatic pumping ther ex, 23/7 compression wraps and simple self-MLD, to ensure optimal limb volume reduction, to limit infection risk and to limit LE progression.    Baseline dependent    Time 12    Period Weeks    Status New    Target Date 04/14/21      OT LONG TERM GOAL #  5   Title By DC from OT Pt will be able to don and doff appropriate daytime compression garments and HOS devices with Mod CG assistance to limit lymphatic re-accumulation and LE progression with before transitioning to self-management phase of CDT.    Baseline Max A    Time 12    Period Weeks    Status New    Target Date 04/14/21      Long Term Additional Goals   Additional Long Term Goals Yes      OT LONG TERM GOAL #6   Title Pt will regain full RUE AROM  for flexion and abduction essential for optimal functional performance of basic and instrumental ADLs, productive activities,  and leisure pursuits by DC from OT.    Baseline Max A    Time 12    Period Weeks    Status New    Target Date 04/14/21                 Plan - 02/15/21 1249    Clinical Impression Statement RUE continues to decongest. Patient tolerated MLD, skin care and gradient compression wrapping without increased pain. If volume remains stable complete anatomical measurements for compression sleve next visit. Cont as per POC.    OT Occupational Profile and History Comprehensive Assessment- Review of records and extensive additional review of physical, cognitive, psychosocial history related to current functional performance    Occupational performance deficits (Please refer to evaluation for details): ADL's;IADL's;Leisure;Work;Social Participation;Rest and Sleep    Body Structure / Function / Physical Skills ADL;Decreased knowledge of use of  DME;Pain;Edema;UE functional use;IADL;ROM;Fascial restriction;Scar mobility;Skin integrity;Decreased knowledge of precautions    Rehab Potential Good    Clinical Decision Making Several treatment options, min-mod task modification necessary    Comorbidities Affecting Occupational Performance: Presence of comorbidities impacting occupational performance    Modification or Assistance to Complete Evaluation  Min-Moderate modification of tasks or assist with assess necessary to complete eval    OT Frequency 2x / week    OT Duration 12 weeks   and PRN for follow along support and garment replacement   OT Treatment/Interventions Self-care/ADL training;DME and/or AE instruction;Manual lymph drainage;Therapeutic activities;Compression bandaging;Therapeutic exercise;Scar mobilization;Coping strategies training;Manual Therapy;Patient/family education    Plan Complete Decongestive Therapy to RUE/RUQ to include manual lymphatic drainage a(MLD), skin care with low ph lotion/ castor oil , therapeutic lymphatic pumping exercise, compression wraps graduating to compression garments for daytime and device for HOS PRN. Will also incorporate kinesiotape, myofascial release PRN, scar massage PRN    Recommended Other Services fit with comfortable and effective gradient comptression garments. It;s likely Pt will need custom sleeve and glove, ccl 2 flat knit    Consulted and Agree with Plan of Care Patient           Patient will benefit from skilled therapeutic intervention in order to improve the following deficits and impairments:   Body Structure / Function / Physical Skills: ADL,Decreased knowledge of use of DME,Pain,Edema,UE functional use,IADL,ROM,Fascial restriction,Scar mobility,Skin integrity,Decreased knowledge of precautions       Visit Diagnosis: Postmastectomy lymphedema syndrome    Problem List Patient Active Problem List   Diagnosis Date Noted  . Goals of care, counseling/discussion 05/17/2020   . Breast cancer, right ([place]) 05/08/2020    Andrey Spearman, MS, OTR/L, [facility] 02/15/21 12:51 PM  [place] MAIN [facility] SERVICES [address] [place], [place], [address] Phone: [phone]   Fax:  [phone]  Name: Kristy York MRN: [id number]  Date of Birth: [birth year]

## 2021-02-18 ENCOUNTER — Other Ambulatory Visit: Payer: Self-pay

## 2021-02-18 ENCOUNTER — Ambulatory Visit: Payer: Medicare PPO | Admitting: Occupational Therapy

## 2021-02-18 DIAGNOSIS — I972 Postmastectomy lymphedema syndrome: Secondary | ICD-10-CM | POA: Diagnosis not present

## 2021-02-18 NOTE — Therapy (Signed)
[place] MAIN [facility] SERVICES [address] [place], [place], [address] Phone: [phone]   Fax:  [phone]  Occupational Therapy Treatment  Patient Details  Name: Kristy York MRN: [id number] Date of Birth: 1951/08/11 Referring Provider (OT): Delight Hoh, MD   Encounter Date: 02/18/2021   OT End of Session - 02/18/21 1021    Visit Number 9    Number of Visits 36    Date for OT Re-Evaluation 04/14/21    Authorization Type 16 visits initially authorized    OT Start Time 1013    OT Stop Time 1113    OT Time Calculation (min) 60 min    Activity Tolerance Patient tolerated treatment well;No increased pain    Behavior During Therapy WFL for tasks assessed/performed           Past Medical History:  Diagnosis Date  . Anxiety   . Arthritis   . Diet-controlled diabetes mellitus ([place])    borderline  . Fatigue   . HLD (hyperlipidemia)   . 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 Metastasis to bone [facility])    Breast primary  . Murmur, cardiac   . Peripheral edema   . T wave inversion on electrocardiogram 05/17/2020   leads I, II, III, aVF, V3-V6    Past Surgical History:  Procedure Laterality Date  . BREAST BIOPSY Right 05/02/2020   [place] Bx, Q clip, path pending  . BREAST BIOPSY Right 05/02/2020   [place] Axilla Bx, path pending  . CHOLECYSTECTOMY    . COLONOSCOPY    . LAPAROSCOPIC ABDOMINAL EXPLORATION    . MASTECTOMY MODIFIED RADICAL Right 05/23/2020   Procedure: MASTECTOMY MODIFIED RADICAL, WITH EXCISION OF AXILLARY CONTENTS;  Surgeon: Robert Bellow, MD;  Location: [facility] ORS;  Service: General;  Laterality: Right;  . PORTACATH PLACEMENT Left 05/23/2020   Procedure: INSERTION PORT-A-CATH;  Surgeon: Robert Bellow, MD;  Location: [facility] ORS;  Service: General;  Laterality: Left;    There were no vitals filed for this visit.   Subjective Assessment - 02/18/21 1023    Subjective   Kristy York presents for OT visit 9/36 to address RUE/RUQ post-mastectomy lymphedema. Pt is unaccompanied today. Pt reports 2/10 in LE-related RUE.    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 anxiety, arthritis, diet controlled diabetes mellitus, fatique, , heart murmer, periferal edema in feet and legs,    Limitations decreased RUE shoulder AROM, chronic RUE swelling and associated pain, fatigue    Repetition Increases Symptoms    Special Tests _ Stemmer R MPs    Pain Onset 1 to 4 weeks ago   3 weeks ago                               OT Education - 02/18/21 1643    Education Details Continued Pt/ CG edu for lymphedema self care  and home program throughout session. Topics include multilayer, gradient compression wrapping, simple self-MLD, therapeutic lymphatic pumping exercises, skin/nail care, risk reduction factors and LE precautions, compression garments/recommendations and wear and care schedule and compression garment donning / doffing using assistive devices. All questions answered to the Pt's satisfaction, and  Pt demonstrates understanding by report.    Person(s) Educated Child(ren);Patient   [name]   Methods Explanation;Demonstration;Handout    Comprehension Verbalized understanding;Need further instruction               OT Long Term Goals - 01/15/21 1719      OT LONG TERM GOAL #1   Title Given this patient's risk-adjustment variables, her Intake Functional Status score of 61/100 on the FOTO tool, patient will experience at least an increase in function of 11 points for a score of 72/100.    Baseline 61/100    Time 12    Period Weeks    Status New    Target Date 04/14/21      OT LONG TERM GOAL #2   Title Pt will be  able to apply multi-layer, short stretch compression wraps using correct gradient techniques with max caregiver assistance to return affected R limb to premorbid size and shape, to limit pain and infection risk, and to improve functional arm and hand use for ADLs.    Baseline dependent    Time 4    Period Days    Status New    Target Date --   4th OT Rx visit     OT LONG TERM GOAL #3   Title With Max  CG Assist during visit intervals Pt  will achieve at least a 10%  limb volume reduction  in the R arm and hand during Intensive Phase CDT to achieve optimal limb volume reduction, to prevent re-accumulation of lymphatic congestion and progression of fibrosis, to limit infection risk, to improve functional arm/hand use, to improve functional performance of basic and instrumental ADLs, and to limit LE progression.    Baseline dependent    Time 12    Period Weeks    Status New    Target Date 04/14/21      OT LONG TERM GOAL #4   Title With Max daily CG assistance Pt will achieve and sustain a least 85% compliance performing all daily LE self-care home program components throughout Intensive Phase CDT, including recommended skin care regime, lymphatic pumping ther ex, 23/7 compression wraps and simple self-MLD, to ensure optimal limb volume reduction, to limit infection risk and to limit LE progression.    Baseline dependent    Time 12    Period Weeks    Status New    Target Date 04/14/21      OT LONG TERM GOAL #5   Title By DC from OT Pt will be able to don and doff appropriate daytime compression garments and HOS devices with Mod CG assistance to limit lymphatic re-accumulation and LE progression with before transitioning to self-management phase of CDT.    Baseline Max A    Time 12    Period Weeks    Status New    Target Date 04/14/21      Long Term Additional Goals   Additional Long Term Goals Yes      OT LONG TERM GOAL #6   Title Pt will regain full RUE AROM  for flexion and abduction  essential for optimal functional performance of basic and instrumental ADLs, productive activities,  and leisure pursuits by DC from OT.    Baseline Max A    Time 12    Period Weeks    Status New    Target Date 04/14/21                 Plan - 02/18/21 1639  Clinical Impression Statement RUE well cngested today with skin wrinkles visible from base of fingers to axilla. Lateral trunk   and chest wasll swelling is less full and skin is slightly more mobile at scar line. Completed measurements for custom compression glove today and set up measuring form for custom RUE arm sleeve. Will ccomplete sleeve measurements and fax to DME vebndor after completing next visit. Assisted Pt to fill out [facility] funding application and submitted electronically on her behalf. Applied multilayer gradient compression wraps as established. Cont as per POC. Pt demonstrates steady progress towards all OT goals for LE care.    OT Occupational Profile and History Comprehensive Assessment- Review of records and extensive additional review of physical, cognitive, psychosocial history related to current functional performance    Occupational performance deficits (Please refer to evaluation for details): ADL's;IADL's;Leisure;Work;Social Participation;Rest and Sleep    Body Structure / Function / Physical Skills ADL;Decreased knowledge of use of DME;Pain;Edema;UE functional use;IADL;ROM;Fascial restriction;Scar mobility;Skin integrity;Decreased knowledge of precautions    Rehab Potential Good    Clinical Decision Making Several treatment options, min-mod task modification necessary    Comorbidities Affecting Occupational Performance: Presence of comorbidities impacting occupational performance    Modification or Assistance to Complete Evaluation  Min-Moderate modification of tasks or assist with assess necessary to complete eval    OT Frequency 2x / week    OT Duration 12 weeks   and PRN for follow  along support and garment replacement   OT Treatment/Interventions Self-care/ADL training;DME and/or AE instruction;Manual lymph drainage;Therapeutic activities;Compression bandaging;Therapeutic exercise;Scar mobilization;Coping strategies training;Manual Therapy;Patient/family education    Plan Complete Decongestive Therapy to RUE/RUQ to include manual lymphatic drainage a(MLD), skin care with low ph lotion/ castor oil , therapeutic lymphatic pumping exercise, compression wraps graduating to compression garments for daytime and device for HOS PRN. Will also incorporate kinesiotape, myofascial release PRN, scar massage PRN    Recommended Other Services fit with comfortable and effective gradient comptression garments. It;s likely Pt will need custom sleeve and glove, ccl 2 flat knit    Consulted and Agree with Plan of Care Patient           Patient will benefit from skilled therapeutic intervention in order to improve the following deficits and impairments:   Body Structure / Function / Physical Skills: ADL,Decreased knowledge of use of DME,Pain,Edema,UE functional use,IADL,ROM,Fascial restriction,Scar mobility,Skin integrity,Decreased knowledge of precautions       Visit Diagnosis: Postmastectomy lymphedema syndrome    Problem List Patient Active Problem List   Diagnosis Date Noted  . Goals of care, counseling/discussion 05/17/2020  . Breast cancer, right ([place]) 05/08/2020    Andrey Spearman, MS, OTR/L, [facility] 02/18/21 4:44 PM  [place] MAIN [facility] SERVICES [address] [place], [place], [address] Phone: [phone]   Fax:  [phone]  Name: Kristyl [place] MRN: [id number] Date of Birth: 02/17/1951

## 2021-02-22 ENCOUNTER — Other Ambulatory Visit: Payer: Self-pay

## 2021-02-22 ENCOUNTER — Ambulatory Visit: Payer: Medicare PPO | Admitting: Occupational Therapy

## 2021-02-22 DIAGNOSIS — I972 Postmastectomy lymphedema syndrome: Secondary | ICD-10-CM | POA: Diagnosis not present

## 2021-02-22 NOTE — Therapy (Signed)
[place] MAIN [facility] SERVICES [address] [place], [place], [address] Phone: [phone]   Fax:  [phone]  Occupational Therapy Treatment Note and Progress Report  Patient Details  Name: Kristy York MRN: [id number] Date of Birth: 15-Apr-1951 Referring Provider (OT): Delight Hoh, MD   Encounter Date: 02/22/2021   OT End of Session - 02/22/21 1055    Visit Number 10    Number of Visits 36    Date for OT Re-Evaluation 04/14/21    Authorization Type 16 visits initially authorized    OT Start Time 0955    OT Stop Time 1115    OT Time Calculation (min) 80 min    Activity Tolerance Patient tolerated treatment well;No increased pain    Behavior During Therapy WFL for tasks assessed/performed           Past Medical History:  Diagnosis Date  . Anxiety   . Arthritis   . Diet-controlled diabetes mellitus ([place])    borderline  . Fatigue   . HLD (hyperlipidemia)   . 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 Metastasis to bone [facility])    Breast primary  . Murmur, cardiac   . Peripheral edema   . T wave inversion on electrocardiogram 05/17/2020   leads I, II, III, aVF, V3-V6    Past Surgical History:  Procedure Laterality Date  . BREAST BIOPSY Right 05/02/2020   [place] Bx, Q clip, path pending  . BREAST BIOPSY Right 05/02/2020   [place] Axilla Bx, path pending  . CHOLECYSTECTOMY    . COLONOSCOPY    . LAPAROSCOPIC ABDOMINAL EXPLORATION    . MASTECTOMY MODIFIED RADICAL Right 05/23/2020   Procedure: MASTECTOMY MODIFIED RADICAL, WITH EXCISION OF AXILLARY CONTENTS;  Surgeon: Robert Bellow, MD;  Location: [facility] ORS;  Service: General;  Laterality: Right;  . PORTACATH PLACEMENT Left 05/23/2020   Procedure: INSERTION PORT-A-CATH;  Surgeon: Robert Bellow, MD;  Location: [facility] ORS;  Service: General;  Laterality: Left;    There were no vitals filed for this visit.   Subjective Assessment -  02/22/21 1057    Subjective  Kristy York presents for OT visit 10/36 to address RUE/RUQ post-mastectomy lymphedema with gradient compression wraps in place on RUE. Marland Kitchen Pt is unaccompanied today. Pt reports 2/10 in LE-related RUE.    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 anxiety, arthritis, diet controlled diabetes mellitus, fatique, , heart murmer, periferal edema in feet and legs,    Limitations decreased RUE shoulder AROM, chronic RUE swelling and associated pain, fatigue    Repetition Increases Symptoms    Special Tests _ [place] R MPs    Pain Onset 1 to 4 weeks ago   3 weeks ago              LYMPHEDEMA/ONCOLOGY QUESTIONNAIRE - 02/22/21 1237      Right Upper Extremity Lymphedema   Other RUE limb volume  2515.2 ml    Other RLE limb volume is decreased by 25.3% % since initially measured on OT Rx day 1 (01/18/21). Limb volume differential (LVD)  is WNL today at 3.06%, R>L  OT Treatments/Exercises (OP) - 02/22/21 1058      ADLs   ADL Education Given Yes      Manual Therapy   Manual Therapy Edema management;Compression Bandaging    Manual therapy comments 10th visit comparative limb volumetrics.    Edema Management completed anatomical measurement fr custom compression arm seeve and glove. Completed measurements for [employer].    Compression Bandaging multi layer compression wraps using gradient technique. Applied fingerbandages leaving palm free. Applied single layer of 0.4 cm thick Rosidal foam over cotton stockinett, then short stretch wraps ( 10 cm ) in gradient conmfiguration to axilla.                  OT Education - 02/22/21 1238    Education Details Continued Pt/ CG edu for lymphedema self care  and  home program throughout session. Topics include multilayer, gradient compression wrapping, simple self-MLD, therapeutic lymphatic pumping exercises, skin/nail care, risk reduction factors and LE precautions, compression garments/recommendations and wear and care schedule and compression garment donning / doffing using assistive devices. All questions answered to the Pt's satisfaction, and Pt demonstrates understanding by report.    Person(s) Educated Child(ren);Patient   [name]   Methods Explanation;Demonstration;Handout    Comprehension Verbalized understanding;Need further instruction               OT Long Term Goals - 02/22/21 1056      OT LONG TERM GOAL #1   Title Given this patient's risk-adjustment variables, her Intake Functional Status score of 61/100 on the FOTO tool, patient will experience at least an increase in function of 11 points for a score of 72/100.    Baseline 61/100    Time 12    Period Weeks    Status On-going    Target Date 04/14/21      OT LONG TERM GOAL #2   Title Pt will be able to apply multi-layer, short stretch compression wraps using correct gradient techniques with max caregiver assistance to return affected R limb to premorbid size and shape, to limit pain and infection risk, and to improve functional arm and hand use for ADLs.    Baseline dependent    Time 4    Period Days    Status Achieved      OT LONG TERM GOAL #3   Title With Max  CG Assist during visit intervals Pt  will achieve at least a 10%  limb volume reduction  in the R arm and hand during Intensive Phase CDT to achieve optimal limb volume reduction, to prevent re-accumulation of lymphatic congestion and progression of fibrosis, to limit infection risk, to improve functional arm/hand use, to improve functional performance of basic and instrumental ADLs, and to limit LE progression.    Baseline dependent    Time 12    Period Weeks    Status Achieved   02/22/21 Acieved with 25.3% limb volume  reduction since 01/18/21. LVD now WNL at 3.06%, R (dominant/ Rx) > L     OT LONG TERM GOAL #4   Title With Max daily CG assistance Pt will achieve and sustain a least 85% compliance performing all daily LE self-care home program components throughout Intensive Phase CDT, including recommended skin care regime, lymphatic pumping ther ex, 23/7 compression wraps and simple self-MLD, to ensure optimal limb volume reduction, to limit infection risk and to limit LE progression.    Baseline dependent    Time 12    Period Weeks    Status  Achieved      OT LONG TERM GOAL #5   Title By DC from OT Pt will be able to don and doff appropriate daytime compression garments and HOS devices with Mod CG assistance to limit lymphatic re-accumulation and LE progression with before transitioning to self-management phase of CDT.    Baseline Max A    Time 12    Period Weeks    Status On-going    Target Date 04/14/21      OT LONG TERM GOAL #6   Title Pt will regain full RUE AROM  for flexion and abduction essential for optimal functional performance of basic and instrumental ADLs, productive activities,  and leisure pursuits by DC from OT.    Baseline Max A    Time 12    Period Weeks    Status On-going    Target Date 04/14/21                 Plan - 02/22/21 1232    Clinical Impression Statement RUE comparative limb volumetrics reveal overall volume reduction                 to date measures 25.3% since commencing CDT. Limb volume differential (LVD) measures 3.06% today, R (dominant/ Rx) > L, which is WNL. Completed custom glove, sleeve and HOS device measurements. Refer to LONG TERM GOALS section for detailed progress today. Pt has had positive response to treatment and is looking forward to fitting arm sleeve. Cobnt as per POC.    OT Occupational Profile and History Comprehensive Assessment- Review of records and extensive additional review of physical, cognitive, psychosocial history related to current  functional performance    Occupational performance deficits (Please refer to evaluation for details): ADL's;IADL's;Leisure;Work;Social Participation;Rest and Sleep    Body Structure / Function / Physical Skills ADL;Decreased knowledge of use of DME;Pain;Edema;UE functional use;IADL;ROM;Fascial restriction;Scar mobility;Skin integrity;Decreased knowledge of precautions    Rehab Potential Good    Clinical Decision Making Several treatment options, min-mod task modification necessary    Comorbidities Affecting Occupational Performance: Presence of comorbidities impacting occupational performance    Modification or Assistance to Complete Evaluation  Min-Moderate modification of tasks or assist with assess necessary to complete eval    OT Frequency 2x / week    OT Duration 12 weeks   and PRN for follow along support and garment replacement   OT Treatment/Interventions Self-care/ADL training;DME and/or AE instruction;Manual lymph drainage;Therapeutic activities;Compression bandaging;Therapeutic exercise;Scar mobilization;Coping strategies training;Manual Therapy;Patient/family education    Plan Complete Decongestive Therapy to RUE/RUQ to include manual lymphatic drainage a(MLD), skin care with low ph lotion/ castor oil , therapeutic lymphatic pumping exercise, compression wraps graduating to compression garments for daytime and device for HOS PRN. Will also incorporate kinesiotape, myofascial release PRN, scar massage PRN    Recommended Other Services fit with comfortable and effective gradient comptression garments. It;s likely Pt will need custom sleeve and glove, ccl 2 flat knit    Consulted and Agree with Plan of Care Patient           Patient will benefit from skilled therapeutic intervention in order to improve the following deficits and impairments:   Body Structure / Function / Physical Skills: ADL,Decreased knowledge of use of DME,Pain,Edema,UE functional use,IADL,ROM,Fascial restriction,Scar  mobility,Skin integrity,Decreased knowledge of precautions       Visit Diagnosis: Postmastectomy lymphedema syndrome    Problem List Patient Active Problem List   Diagnosis Date Noted  . Goals of care, counseling/discussion 05/17/2020  . Breast  cancer, right [facility]) 05/08/2020    Andrey Spearman, MS, OTR/L, [facility] 02/22/21 12:40 PM   [place] MAIN [facility] SERVICES [address] [place], [place], [address] Phone: [phone]   Fax:  [phone]  Name: Kristy York MRN: [id number] Date of Birth: March 31, 1951

## 2021-02-26 ENCOUNTER — Inpatient Hospital Stay: Payer: Medicare PPO

## 2021-02-26 ENCOUNTER — Inpatient Hospital Stay: Payer: Medicare PPO | Attending: Oncology

## 2021-02-26 VITALS — BP 124/69 | HR 83 | Temp 97.0°F | Resp 18

## 2021-02-26 DIAGNOSIS — C50911 Malignant neoplasm of unspecified site of right female breast: Secondary | ICD-10-CM | POA: Diagnosis not present

## 2021-02-26 DIAGNOSIS — Z170 Estrogen receptor positive status [ER+]: Secondary | ICD-10-CM | POA: Diagnosis not present

## 2021-02-26 DIAGNOSIS — C7951 Secondary malignant neoplasm of bone: Secondary | ICD-10-CM | POA: Diagnosis present

## 2021-02-26 LAB — BASIC METABOLIC PANEL
Anion gap: 11 (ref 5–15)
BUN: 18 mg/dL (ref 8–23)
CO2: 24 mmol/L (ref 22–32)
Calcium: 9.5 mg/dL (ref 8.9–10.3)
Chloride: 104 mmol/L (ref 98–111)
Creatinine, Ser: 0.95 mg/dL (ref 0.44–1.00)
GFR, Estimated: >60 mL/min (ref >60)
Glucose, Bld: 121 mg/dL — ABNORMAL HIGH (ref 70–99)
Potassium: 3.9 mmol/L (ref 3.5–5.1)
Sodium: 139 mmol/L (ref 135–145)

## 2021-02-26 MED ORDER — HEPARIN SOD (PORK) LOCK FLUSH 100 UNIT/ML IV SOLN
INTRAVENOUS | Status: AC
  Filled 2021-02-26: qty 5

## 2021-02-26 MED ORDER — SODIUM CHLORIDE 0.9 % IV SOLN
Freq: Once | INTRAVENOUS | Status: AC
  Filled 2021-02-26: qty 250

## 2021-02-26 MED ORDER — SODIUM CHLORIDE 0.9% FLUSH
10.0000 mL | Freq: Once | INTRAVENOUS | Status: AC
Start: 2021-02-26 — End: 2021-02-26
  Administered 2021-02-26: 10 mL via INTRAVENOUS
  Filled 2021-02-26: qty 10

## 2021-02-26 MED ORDER — HEPARIN SOD (PORK) LOCK FLUSH 100 UNIT/ML IV SOLN
500.0000 [IU] | Freq: Once | INTRAVENOUS | Status: AC
  Administered 2021-02-26: 500 [IU] via INTRAVENOUS
  Filled 2021-02-26: qty 5

## 2021-02-26 MED ORDER — HEPARIN SOD (PORK) LOCK FLUSH 100 UNIT/ML IV SOLN
250.0000 [IU] | Freq: Once | INTRAVENOUS | Status: DC | PRN
  Filled 2021-02-26: qty 5

## 2021-02-26 MED ORDER — ZOLEDRONIC ACID 4 MG/100ML IV SOLN
4.0000 mg | Freq: Once | INTRAVENOUS | Status: AC
  Administered 2021-02-26: 4 mg via INTRAVENOUS
  Filled 2021-02-26: qty 100

## 2021-02-26 NOTE — Patient Instructions (Signed)
Zoledronic Acid Injection (Hypercalcemia, Oncology) What is this medicine? ZOLEDRONIC ACID (ZOE le dron ik AS id) slows calcium loss from bones. It high calcium levels in the blood from some kinds of cancer. It may be used in other people at risk for bone loss. This medicine may be used for other purposes; ask your health care provider or pharmacist if you have questions. COMMON BRAND NAME(S): Zometa What should I tell my health care provider before I take this medicine? They need to know if you have any of these conditions:  cancer  dehydration  dental disease  kidney disease  liver disease  low levels of calcium in the blood  lung or breathing disease (asthma)  receiving steroids like dexamethasone or prednisone  an unusual or allergic reaction to zoledronic acid, other medicines, foods, dyes, or preservatives  pregnant or trying to get pregnant  breast-feeding How should I use this medicine? This drug is injected into a vein. It is given by a health care provider in a hospital or clinic setting. Talk to your health care provider about the use of this drug in children. Special care may be needed. Overdosage: If you think you have taken too much of this medicine contact a poison control center or emergency room at once. NOTE: This medicine is only for you. Do not share this medicine with others. What if I miss a dose? Keep appointments for follow-up doses. It is important not to miss your dose. Call your health care provider if you are unable to keep an appointment. What may interact with this medicine?  certain antibiotics given by injection  NSAIDs, medicines for pain and inflammation, like ibuprofen or naproxen  some diuretics like bumetanide, furosemide  teriparatide  thalidomide This list may not describe all possible interactions. Give your health care provider a list of all the medicines, herbs, non-prescription drugs, or dietary supplements you use. Also tell  them if you smoke, drink alcohol, or use illegal drugs. Some items may interact with your medicine. What should I watch for while using this medicine? Visit your health care provider for regular checks on your progress. It may be some time before you see the benefit from this drug. Some people who take this drug have severe bone, joint, or muscle pain. This drug may also increase your risk for jaw problems or a broken thigh bone. Tell your health care provider right away if you have severe pain in your jaw, bones, joints, or muscles. Tell you health care provider if you have any pain that does not go away or that gets worse. Tell your dentist and dental surgeon that you are taking this drug. You should not have major dental surgery while on this drug. See your dentist to have a dental exam and fix any dental problems before starting this drug. Take good care of your teeth while on this drug. Make sure you see your dentist for regular follow-up appointments. You should make sure you get enough calcium and vitamin D while you are taking this drug. Discuss the foods you eat and the vitamins you take with your health care provider. Check with your health care provider if you have severe diarrhea, nausea, and vomiting, or if you sweat a lot. The loss of too much body fluid may make it dangerous for you to take this drug. You may need blood work done while you are taking this drug. Do not become pregnant while taking this drug. Women should inform their health care provider   if they wish to become pregnant or think they might be pregnant. There is potential for serious harm to an unborn child. Talk to your health care provider for more information. What side effects may I notice from receiving this medicine? Side effects that you should report to your doctor or health care provider as soon as possible:  allergic reactions (skin rash, itching or hives; swelling of the face, lips, or tongue)  bone  pain  infection (fever, chills, cough, sore throat, pain or trouble passing urine)  jaw pain, especially after dental work  joint pain  kidney injury (trouble passing urine or change in the amount of urine)  low blood pressure (dizziness; feeling faint or lightheaded, falls; unusually weak or tired)  low calcium levels (fast heartbeat; muscle cramps or pain; pain, tingling, or numbness in the hands or feet; seizures)  low magnesium levels (fast, irregular heartbeat; muscle cramp or pain; muscle weakness; tremors; seizures)  low red blood cell counts (trouble breathing; feeling faint; lightheaded, falls; unusually weak or tired)  muscle pain  redness, blistering, peeling, or loosening of the skin, including inside the mouth  severe diarrhea  swelling of the ankles, feet, hands  trouble breathing Side effects that usually do not require medical attention (report to your doctor or health care provider if they continue or are bothersome):  anxious  constipation  coughing  depressed mood  eye irritation, itching, or pain  fever  general ill feeling or flu-like symptoms  nausea  pain, redness, or irritation at site where injected  trouble sleeping This list may not describe all possible side effects. Call your doctor for medical advice about side effects. You may report side effects to FDA at 1-800-FDA-1088. Where should I keep my medicine? This drug is given in a hospital or clinic. It will not be stored at home. NOTE: This sheet is a summary. It may not cover all possible information. If you have questions about this medicine, talk to your doctor, pharmacist, or health care provider.  2021 Elsevier/Gold Standard (2019-07-07 09:13:00)  

## 2021-02-27 ENCOUNTER — Other Ambulatory Visit: Payer: Self-pay

## 2021-02-27 ENCOUNTER — Ambulatory Visit: Payer: Medicare PPO | Admitting: Occupational Therapy

## 2021-02-27 DIAGNOSIS — I972 Postmastectomy lymphedema syndrome: Secondary | ICD-10-CM

## 2021-02-27 LAB — CANCER ANTIGEN 27.29: CA 27.29: 60.8 U/mL — ABNORMAL HIGH (ref 0.0–38.6)

## 2021-02-28 NOTE — Therapy (Signed)
[place] MAIN [facility] SERVICES [address] [place], [place], [address] Phone: [phone]   Fax:  [phone]  Occupational Therapy Treatment  Patient Details  Name: Kristy York MRN: [id number] Date of Birth: [birth year] Referring Provider (OT): Delight Hoh, MD   Encounter Date: 02/27/2021   OT End of Session - 02/27/21 1412    Visit Number 11    Number of Visits 36    Date for OT Re-Evaluation 04/14/21    Authorization Type 16 visits initially authorized    OT Start Time 0205    OT Stop Time 0312    OT Time Calculation (min) 67 min    Activity Tolerance Patient tolerated treatment well;No increased pain    Behavior During Therapy WFL for tasks assessed/performed           Past Medical History:  Diagnosis Date  . Anxiety   . Arthritis   . Diet-controlled diabetes mellitus ([place])    borderline  . Fatigue   . HLD (hyperlipidemia)   . Invasive carcinoma of breast ([place]) 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; ER/PR (+), HER2/neu (-)  . Metastasis to bone [facility])    Breast primary  . Murmur, cardiac   . Peripheral edema   . T wave inversion on electrocardiogram 05/17/2020   leads I, II, III, aVF, V3-V6    Past Surgical History:  Procedure Laterality Date  . BREAST BIOPSY Right 05/02/2020   [place] Bx, Q clip, path pending  . BREAST BIOPSY Right 05/02/2020   [place] Axilla Bx, path pending  . CHOLECYSTECTOMY    . COLONOSCOPY    . LAPAROSCOPIC ABDOMINAL EXPLORATION    . MASTECTOMY MODIFIED RADICAL Right 05/23/2020   Procedure: MASTECTOMY MODIFIED RADICAL, WITH EXCISION OF AXILLARY CONTENTS;  Surgeon: Robert Bellow, MD;  Location: [facility] ORS;  Service: General;  Laterality: Right;  . PORTACATH PLACEMENT Left 05/23/2020   Procedure: INSERTION PORT-A-CATH;  Surgeon: Robert Bellow, MD;  Location: [facility] ORS;  Service: General;  Laterality: Left;    There were no vitals filed for this visit.   Subjective Assessment - 02/27/21 1412    Subjective   Kristy York presents for OT visit 11/36 to address RUE/RUQ post-mastectomy lymphedema with gradient compression wraps in place on RUE. She is accompanied by her sister. Pt denies LE related pain in the affected RUE/ RUQ.    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 anxiety, arthritis, diet controlled diabetes mellitus, fatique, , heart murmer, periferal edema in feet and legs,    Limitations decreased RUE shoulder AROM, chronic RUE swelling and associated pain, fatigue    Repetition Increases Symptoms    Special Tests _ Stemmer R MPs    Pain Onset 1 to 4 weeks ago   3 weeks ago                       OT Treatments/Exercises (OP) - 02/27/21 1414      ADLs   ADL Education Given Yes      Manual Therapy   Manual Therapy Edema management;Compression Bandaging    Manual therapy comments 10th visit comparative limb volumetrics.    Manual Lymphatic Drainage (MLD) MLD to RUE/RUQ with emphasis on upper arm today.Next visit will focus  on trunk and chest wall directing lymphatic congestion alternatively to adjacent L axilla and R inguinal anastamosis.    Compression Bandaging multi layer compression wraps using gradient technique. Applied fingerbandages leaving palm free. Applied single layer of 0.4 cm thick Rosidal foam over cotton stockinett, then short stretch wraps ( 10 cm ) in gradient conmfiguration to axilla.                  OT Education - 02/27/21 1416    Education Details Continued Pt/ CG edu for lymphedema self care  and home program throughout session. Topics include multilayer, gradient compression wrapping, simple self-MLD, therapeutic lymphatic pumping exercises, skin/nail care, risk reduction factors and LE precautions, compression  garments/recommendations and wear and care schedule and compression garment donning / doffing using assistive devices. All questions answered to the Pt's satisfaction, and Pt demonstrates understanding by report.    Person(s) Educated Child(ren);Patient   Kristy York   Methods Explanation;Demonstration;Handout    Comprehension Verbalized understanding;Need further instruction               OT Long Term Goals - 02/22/21 1056      OT LONG TERM GOAL #1   Title Given this patient's risk-adjustment variables, her Intake Functional Status score of 61/100 on the FOTO tool, patient will experience at least an increase in function of 11 points for a score of 72/100.    Baseline 61/100    Time 12    Period Weeks    Status On-going    Target Date 04/14/21      OT LONG TERM GOAL #2   Title Pt will be able to apply multi-layer, short stretch compression wraps using correct gradient techniques with max caregiver assistance to return affected R limb to premorbid size and shape, to limit pain and infection risk, and to improve functional arm and hand use for ADLs.    Baseline dependent    Time 4    Period Days    Status Achieved      OT LONG TERM GOAL #3   Title With Max  CG Assist during visit intervals Pt  will achieve at least a 10%  limb volume reduction  in the R arm and hand during Intensive Phase CDT to achieve optimal limb volume reduction, to prevent re-accumulation of lymphatic congestion and progression of fibrosis, to limit infection risk, to improve functional arm/hand use, to improve functional performance of basic and instrumental ADLs, and to limit LE progression.    Baseline dependent    Time 12    Period Weeks    Status Achieved   02/22/21 Acieved with 25.3% limb volume reduction since 01/18/21. LVD now WNL at 3.06%, R (dominant/ Rx) > L     OT LONG TERM GOAL #4   Title With Max daily CG assistance Pt will achieve and sustain a least 85% compliance performing all daily LE self-care  home program components throughout Intensive Phase CDT, including recommended skin care regime, lymphatic pumping ther ex, 23/7 compression wraps and simple self-MLD, to ensure optimal limb volume reduction, to limit infection risk and to limit LE progression.    Baseline dependent    Time 12    Period Weeks    Status Achieved      OT LONG TERM GOAL #5   Title By DC from OT Pt will be able to don and doff appropriate daytime compression garments and HOS devices with Mod CG assistance to limit lymphatic re-accumulation and LE progression with before  transitioning to self-management phase of CDT.    Baseline Max A    Time 12    Period Weeks    Status On-going    Target Date 04/14/21      OT LONG TERM GOAL #6   Title Pt will regain full RUE AROM  for flexion and abduction essential for optimal functional performance of basic and instrumental ADLs, productive activities,  and leisure pursuits by DC from OT.    Baseline Max A    Time 12    Period Weeks    Status On-going    Target Date 04/14/21                 Plan - 02/27/21 1416    Clinical Impression Statement Pt tolerated RUE MLD and gradient compression wrapping today without increased pain. Taught simple self MLD using demonstration verbal instruction and printed resources with good return for J stroke and neck sequence.. Fit custom compression ASAP and cont as per POC.    OT Occupational Profile and History Comprehensive Assessment- Review of records and extensive additional review of physical, cognitive, psychosocial history related to current functional performance    Occupational performance deficits (Please refer to evaluation for details): ADL's;IADL's;Leisure;Work;Social Participation;Rest and Sleep    Body Structure / Function / Physical Skills ADL;Decreased knowledge of use of DME;Pain;Edema;UE functional use;IADL;ROM;Fascial restriction;Scar mobility;Skin integrity;Decreased knowledge of precautions    Rehab Potential  Good    Clinical Decision Making Several treatment options, min-mod task modification necessary    Comorbidities Affecting Occupational Performance: Presence of comorbidities impacting occupational performance    Modification or Assistance to Complete Evaluation  Min-Moderate modification of tasks or assist with assess necessary to complete eval    OT Frequency 2x / week    OT Duration 12 weeks   and PRN for follow along support and garment replacement   OT Treatment/Interventions Self-care/ADL training;DME and/or AE instruction;Manual lymph drainage;Therapeutic activities;Compression bandaging;Therapeutic exercise;Scar mobilization;Coping strategies training;Manual Therapy;Patient/family education    Plan Complete Decongestive Therapy to RUE/RUQ to include manual lymphatic drainage a(MLD), skin care with low ph lotion/ castor oil , therapeutic lymphatic pumping exercise, compression wraps graduating to compression garments for daytime and device for HOS PRN. Will also incorporate kinesiotape, myofascial release PRN, scar massage PRN    Recommended Other Services fit with comfortable and effective gradient comptression garments. It;s likely Pt will need custom sleeve and glove, ccl 2 flat knit    Consulted and Agree with Plan of Care Patient           Patient will benefit from skilled therapeutic intervention in order to improve the following deficits and impairments:   Body Structure / Function / Physical Skills: ADL,Decreased knowledge of use of DME,Pain,Edema,UE functional use,IADL,ROM,Fascial restriction,Scar mobility,Skin integrity,Decreased knowledge of precautions       Visit Diagnosis: Postmastectomy lymphedema syndrome    Problem List Patient Active Problem List   Diagnosis Date Noted  . Goals of care, counseling/discussion 05/17/2020  . Breast cancer, right ([place]) 05/08/2020    Andrey Spearman, MS, OTR/L, [facility] 02/28/21 4:24 PM  [place] MAIN [facility] SERVICES [address] [place], [place], [address] Phone: [phone]   Fax:  [phone]  Name: Kristy York MRN: [id number] Date of Birth: 1950-10-13

## 2021-03-01 ENCOUNTER — Ambulatory Visit: Payer: Medicare PPO | Admitting: Occupational Therapy

## 2021-03-07 ENCOUNTER — Ambulatory Visit: Payer: Medicare PPO | Attending: Oncology | Admitting: Occupational Therapy

## 2021-03-07 DIAGNOSIS — I972 Postmastectomy lymphedema syndrome: Secondary | ICD-10-CM | POA: Insufficient documentation

## 2021-03-08 ENCOUNTER — Encounter: Payer: Medicare PPO | Admitting: Occupational Therapy

## 2021-03-11 ENCOUNTER — Other Ambulatory Visit: Payer: Self-pay

## 2021-03-11 ENCOUNTER — Ambulatory Visit: Payer: Medicare PPO | Admitting: Occupational Therapy

## 2021-03-11 DIAGNOSIS — I972 Postmastectomy lymphedema syndrome: Secondary | ICD-10-CM

## 2021-03-11 NOTE — Therapy (Signed)
[place] MAIN [facility] SERVICES [address] [place], [place], [address] Phone: [phone]   Fax:  [phone]  Occupational Therapy Treatment  Patient Details  Name: Kristy York MRN: [id number] Date of Birth: 06-09-1951 Referring Provider (OT): Delight Hoh, MD   Encounter Date: 03/11/2021   OT End of Session - 03/11/21 1533    Visit Number 12    Number of Visits 36    Date for OT Re-Evaluation 04/14/21    Authorization Type 16 visits initially authorized    OT Start Time 0205    OT Stop Time 0305    OT Time Calculation (min) 60 min    Activity Tolerance Patient tolerated treatment well;No increased pain    Behavior During Therapy WFL for tasks assessed/performed           Past Medical History:  Diagnosis Date  . Anxiety   . Arthritis   . Diet-controlled diabetes mellitus ([place])    borderline  . Fatigue   . HLD (hyperlipidemia)   . 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 Metastasis to bone [facility])    Breast primary  . Murmur, cardiac   . Peripheral edema   . T wave inversion on electrocardiogram 05/17/2020   leads I, II, III, aVF, V3-V6    Past Surgical History:  Procedure Laterality Date  . BREAST BIOPSY Right 05/02/2020   [place] Bx, Q clip, path pending  . BREAST BIOPSY Right 05/02/2020   [place] Axilla Bx, path pending  . CHOLECYSTECTOMY    . COLONOSCOPY    . LAPAROSCOPIC ABDOMINAL EXPLORATION    . MASTECTOMY MODIFIED RADICAL Right 05/23/2020   Procedure: MASTECTOMY MODIFIED RADICAL, WITH EXCISION OF AXILLARY CONTENTS;  Surgeon: Robert Bellow, MD;  Location: [facility] ORS;  Service: General;  Laterality: Right;  . PORTACATH PLACEMENT Left 05/23/2020   Procedure: INSERTION PORT-A-CATH;  Surgeon: Robert Bellow, MD;  Location: [facility] ORS;  Service: General;  Laterality: Left;    There were no vitals filed for this visit.   Subjective Assessment - 03/11/21 1536    Subjective   [name] presents for OT visit 12/36 to address RUE/RUQ post-mastectomy lymphedema with gradient compression wraps in place on RUE. She is accompanied by her sister. Pt denies LE related pain in the affected RUE/ RUQ.    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 anxiety, arthritis, diet controlled diabetes mellitus, fatique, , heart murmer, periferal edema in feet and legs,    Limitations decreased RUE shoulder AROM, chronic RUE swelling and associated pain, fatigue    Repetition Increases Symptoms    Special Tests _ Stemmer R MPs    Pain Onset 1 to 4 weeks ago   3 weeks ago                       OT Treatments/Exercises (OP) - 03/11/21 1537      ADLs   ADL Education Given Yes      Manual Therapy   Manual Therapy Edema management;Compression Bandaging    Manual Lymphatic Drainage (MLD) MLD to RUE/RUQ with emphasis on upper arm today.Next visit will focus on trunk and chest wall directing lymphatic congestion alternatively to adjacent  L axilla and R inguinal anastamosis.    Compression Bandaging multi layer compression wraps using gradient technique. Applied fingerbandages leaving palm free. Applied single layer of 0.4 cm thick Rosidal foam over cotton stockinett, then short stretch wraps ( 10 cm ) in gradient conmfiguration to axilla.                  OT Education - 03/11/21 1537    Education Details Continued Pt/ CG edu for lymphedema self care  and home program throughout session. Topics include multilayer, gradient compression wrapping, simple self-MLD, therapeutic lymphatic pumping exercises, skin/nail care, risk reduction factors and LE precautions, compression garments/recommendations and wear and care schedule and compression  garment donning / doffing using assistive devices. All questions answered to the Pt's satisfaction, and Pt demonstrates understanding by report.    Person(s) Educated Child(ren);Patient   [name]   Methods Explanation;Demonstration;Handout    Comprehension Verbalized understanding;Need further instruction               OT Long Term Goals - 02/22/21 1056      OT LONG TERM GOAL #1   Title Given this patient's risk-adjustment variables, her Intake Functional Status score of 61/100 on the FOTO tool, patient will experience at least an increase in function of 11 points for a score of 72/100.    Baseline 61/100    Time 12    Period Weeks    Status On-going    Target Date 04/14/21      OT LONG TERM GOAL #2   Title Pt will be able to apply multi-layer, short stretch compression wraps using correct gradient techniques with max caregiver assistance to return affected R limb to premorbid size and shape, to limit pain and infection risk, and to improve functional arm and hand use for ADLs.    Baseline dependent    Time 4    Period Days    Status Achieved      OT LONG TERM GOAL #3   Title With Max  CG Assist during visit intervals Pt  will achieve at least a 10%  limb volume reduction  in the R arm and hand during Intensive Phase CDT to achieve optimal limb volume reduction, to prevent re-accumulation of lymphatic congestion and progression of fibrosis, to limit infection risk, to improve functional arm/hand use, to improve functional performance of basic and instrumental ADLs, and to limit LE progression.    Baseline dependent    Time 12    Period Weeks    Status Achieved   02/22/21 Acieved with 25.3% limb volume reduction since 01/18/21. LVD now WNL at 3.06%, R (dominant/ Rx) > L     OT LONG TERM GOAL #4   Title With Max daily CG assistance Pt will achieve and sustain a least 85% compliance performing all daily LE self-care home program components throughout Intensive Phase CDT, including  recommended skin care regime, lymphatic pumping ther ex, 23/7 compression wraps and simple self-MLD, to ensure optimal limb volume reduction, to limit infection risk and to limit LE progression.    Baseline dependent    Time 12    Period Weeks    Status Achieved      OT LONG TERM GOAL #5   Title By DC from OT Pt will be able to don and doff appropriate daytime compression garments and HOS devices with Mod CG assistance to limit lymphatic re-accumulation and LE progression with before transitioning to self-management phase of CDT.    Baseline Max  A    Time 12    Period Weeks    Status On-going    Target Date 04/14/21      OT LONG TERM GOAL #6   Title Pt will regain full RUE AROM  for flexion and abduction essential for optimal functional performance of basic and instrumental ADLs, productive activities,  and leisure pursuits by DC from OT.    Baseline Max A    Time 12    Period Weeks    Status On-going    Target Date 04/14/21                 Plan - 03/11/21 1534    Clinical Impression Statement RUE continues to decongest. Patient tolerated MLD, skin care and gradient compression wrapping without increased pain. Son continues to assist with compression wrapping between visits. Custom compression sleve and glove have been ordered. We will fit garments asap, AND IN MEANTIME CONTINUE OT for CDT 2 x weekly for manual therapy. Cont as per POC.    OT Occupational Profile and History Comprehensive Assessment- Review of records and extensive additional review of physical, cognitive, psychosocial history related to current functional performance    Occupational performance deficits (Please refer to evaluation for details): ADL's;IADL's;Leisure;Work;Social Participation;Rest and Sleep    Body Structure / Function / Physical Skills ADL;Decreased knowledge of use of DME;Pain;Edema;UE functional use;IADL;ROM;Fascial restriction;Scar mobility;Skin integrity;Decreased knowledge of precautions     Rehab Potential Good    Clinical Decision Making Several treatment options, min-mod task modification necessary    Comorbidities Affecting Occupational Performance: Presence of comorbidities impacting occupational performance    Modification or Assistance to Complete Evaluation  Min-Moderate modification of tasks or assist with assess necessary to complete eval    OT Frequency 2x / week    OT Duration 12 weeks   and PRN for follow along support and garment replacement   OT Treatment/Interventions Self-care/ADL training;DME and/or AE instruction;Manual lymph drainage;Therapeutic activities;Compression bandaging;Therapeutic exercise;Scar mobilization;Coping strategies training;Manual Therapy;Patient/family education    Plan Complete Decongestive Therapy to RUE/RUQ to include manual lymphatic drainage a(MLD), skin care with low ph lotion/ castor oil , therapeutic lymphatic pumping exercise, compression wraps graduating to compression garments for daytime and device for HOS PRN. Will also incorporate kinesiotape, myofascial release PRN, scar massage PRN    Recommended Other Services fit with comfortable and effective gradient comptression garments. It;s likely Pt will need custom sleeve and glove, ccl 2 flat knit    Consulted and Agree with Plan of Care Patient           Patient will benefit from skilled therapeutic intervention in order to improve the following deficits and impairments:   Body Structure / Function / Physical Skills: ADL,Decreased knowledge of use of DME,Pain,Edema,UE functional use,IADL,ROM,Fascial restriction,Scar mobility,Skin integrity,Decreased knowledge of precautions       Visit Diagnosis: Postmastectomy lymphedema syndrome    Problem List Patient Active Problem List   Diagnosis Date Noted  . Goals of care, counseling/discussion 05/17/2020  . Breast cancer, right ([place]) 05/08/2020    Andrey Spearman, MS, OTR/L, [facility] 03/11/21 3:38 PM  [place] MAIN [facility] SERVICES [address] [place], [place], [address] Phone: [phone]   Fax:  [phone]  Name: Kristy York MRN: [id number] Date of Birth: [birth year]

## 2021-03-13 ENCOUNTER — Encounter: Payer: Medicare PPO | Admitting: Occupational Therapy

## 2021-03-14 ENCOUNTER — Ambulatory Visit: Payer: Medicare PPO | Admitting: Occupational Therapy

## 2021-03-14 ENCOUNTER — Other Ambulatory Visit: Payer: Self-pay

## 2021-03-14 DIAGNOSIS — I972 Postmastectomy lymphedema syndrome: Secondary | ICD-10-CM | POA: Diagnosis not present

## 2021-03-14 NOTE — Therapy (Signed)
[place] MAIN [facility] SERVICES [address] [place], [place], [address] Phone: [phone]   Fax:  [phone]  Occupational Therapy Treatment  Patient Details  Name: Kristy York MRN: [id number] Date of Birth: [birth year] Referring Provider (OT): Delight Hoh, MD   Encounter Date: 03/14/2021   OT End of Session - 03/14/21 1609     Visit Number 13    Number of Visits 36    Date for OT Re-Evaluation 04/14/21    Authorization Type 16 visits initially authorized    OT Start Time 0100    OT Stop Time 0209    OT Time Calculation (min) 69 min    Activity Tolerance Patient tolerated treatment well;No increased pain    Behavior During Therapy WFL for tasks assessed/performed             Past Medical History:  Diagnosis Date   Anxiety    Arthritis    Diet-controlled diabetes mellitus ([place])    borderline   Fatigue    HLD (hyperlipidemia)    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; ER/PR (+), HER2/neu (-)   Metastasis to bone (HCC)    Breast primary   Murmur, cardiac    Peripheral edema    T wave inversion on electrocardiogram 05/17/2020   leads I, II, III, aVF, V3-V6    Past Surgical History:  Procedure Laterality Date   BREAST BIOPSY Right 05/02/2020   [place] Bx, Q clip, path pending   BREAST BIOPSY Right 05/02/2020   [place] Axilla Bx, path pending   CHOLECYSTECTOMY     COLONOSCOPY     LAPAROSCOPIC ABDOMINAL EXPLORATION     MASTECTOMY MODIFIED RADICAL Right 05/23/2020   Procedure: MASTECTOMY MODIFIED RADICAL, WITH EXCISION OF AXILLARY CONTENTS;  Surgeon: Robert Bellow, MD;  Location: [facility] ORS;  Service: General;  Laterality: Right;   PORTACATH PLACEMENT Left 05/23/2020   Procedure: INSERTION PORT-A-CATH;  Surgeon: Robert Bellow, MD;  Location: [facility] ORS;  Service: General;  Laterality: Left;    There were no vitals filed for this visit.   Subjective Assessment - 03/14/21 1613     Subjective  Kristy York presents for OT visit 13/36 to address RUE/RUQ post-mastectomy lymphedema with gradient compression wraps in place on RUE. She is accompanied by her sister. Pt denies LE related pain in the affected RUE/ RUQ.    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 anxiety, arthritis, diet controlled diabetes mellitus, fatique, , heart murmer, periferal edema in feet and legs,    Limitations decreased RUE shoulder AROM, chronic RUE swelling and associated pain, fatigue    Repetition Increases Symptoms    Special Tests _ Stemmer R MPs    Pain Onset 1 to 4 weeks ago   3 weeks ago                         OT Treatments/Exercises (OP) - 03/14/21 1613       ADLs   ADL Education Given Yes      Manual Therapy   Manual Therapy Edema management;Compression Bandaging    Manual Lymphatic Drainage (MLD) MLD to RUE/RUQ with emphasis on upper arm today.Next visit will focus on trunk and chest  wall directing lymphatic congestion alternatively to adjacent L axilla and R inguinal anastamosis.    Compression Bandaging multi layer compression wraps using gradient technique. Applied fingerbandages leaving palm free. Applied single layer of 0.4 cm thick Rosidal foam over cotton stockinett, then short stretch wraps ( 10 cm ) in gradient conmfiguration to axilla.                    OT Education - 03/14/21 1614     Education Details Continued Pt/ CG edu for lymphedema self care  and home program throughout session. Topics include multilayer, gradient compression wrapping, simple self-MLD, therapeutic lymphatic pumping exercises, skin/nail care, risk reduction factors and LE precautions, compression garments/recommendations and wear and care schedule and  compression garment donning / doffing using assistive devices. All questions answered to the Pt's satisfaction, and Pt demonstrates understanding by report.    Person(s) Educated Child(ren);Patient   Kristy York   Methods Explanation;Demonstration;Handout    Comprehension Verbalized understanding;Need further instruction                 OT Long Term Goals - 02/22/21 1056       OT LONG TERM GOAL #1   Title Given this patient's risk-adjustment variables, her Intake Functional Status score of 61/100 on the FOTO tool, patient will experience at least an increase in function of 11 points for a score of 72/100.    Baseline 61/100    Time 12    Period Weeks    Status On-going    Target Date 04/14/21      OT LONG TERM GOAL #2   Title Pt will be able to apply multi-layer, short stretch compression wraps using correct gradient techniques with max caregiver assistance to return affected R limb to premorbid size and shape, to limit pain and infection risk, and to improve functional arm and hand use for ADLs.    Baseline dependent    Time 4    Period Days    Status Achieved      OT LONG TERM GOAL #3   Title With Max  CG Assist during visit intervals Pt  will achieve at least a 10%  limb volume reduction  in the R arm and hand during Intensive Phase CDT to achieve optimal limb volume reduction, to prevent re-accumulation of lymphatic congestion and progression of fibrosis, to limit infection risk, to improve functional arm/hand use, to improve functional performance of basic and instrumental ADLs, and to limit LE progression.    Baseline dependent    Time 12    Period Weeks    Status Achieved   02/22/21 Acieved with 25.3% limb volume reduction since 01/18/21. LVD now WNL at 3.06%, R (dominant/ Rx) > L     OT LONG TERM GOAL #4   Title With Max daily CG assistance Pt will achieve and sustain a least 85% compliance performing all daily LE self-care home program components throughout Intensive Phase  CDT, including recommended skin care regime, lymphatic pumping ther ex, 23/7 compression wraps and simple self-MLD, to ensure optimal limb volume reduction, to limit infection risk and to limit LE progression.    Baseline dependent    Time 12    Period Weeks    Status Achieved      OT LONG TERM GOAL #5   Title By DC from OT Pt will be able to don and doff appropriate daytime compression garments and HOS devices with Mod CG assistance to limit lymphatic re-accumulation and LE progression  with before transitioning to self-management phase of CDT.    Baseline Max A    Time 12    Period Weeks    Status On-going    Target Date 04/14/21      OT LONG TERM GOAL #6   Title Pt will regain full RUE AROM  for flexion and abduction essential for optimal functional performance of basic and instrumental ADLs, productive activities,  and leisure pursuits by DC from OT.    Baseline Max A    Time 12    Period Weeks    Status On-going    Target Date 04/14/21                   Plan - 03/14/21 1612     Clinical Impression Statement Pt tolerated MLD, skin care and multilayer compression wrapping as established. Limb volume continues to decrease and Pt's son continues to diligently assist her between visits. Cont as per POC.    OT Occupational Profile and History Comprehensive Assessment- Review of records and extensive additional review of physical, cognitive, psychosocial history related to current functional performance    Occupational performance deficits (Please refer to evaluation for details): ADL's;IADL's;Leisure;Work;Social Participation;Rest and Sleep    Body Structure / Function / Physical Skills ADL;Decreased knowledge of use of DME;Pain;Edema;UE functional use;IADL;ROM;Fascial restriction;Scar mobility;Skin integrity;Decreased knowledge of precautions    Rehab Potential Good    Clinical Decision Making Several treatment options, min-mod task modification necessary    Comorbidities  Affecting Occupational Performance: Presence of comorbidities impacting occupational performance    Modification or Assistance to Complete Evaluation  Min-Moderate modification of tasks or assist with assess necessary to complete eval    OT Frequency 2x / week    OT Duration 12 weeks   and PRN for follow along support and garment replacement   OT Treatment/Interventions Self-care/ADL training;DME and/or AE instruction;Manual lymph drainage;Therapeutic activities;Compression bandaging;Therapeutic exercise;Scar mobilization;Coping strategies training;Manual Therapy;Patient/family education    Plan Complete Decongestive Therapy to RUE/RUQ to include manual lymphatic drainage a(MLD), skin care with low ph lotion/ castor oil , therapeutic lymphatic pumping exercise, compression wraps graduating to compression garments for daytime and device for HOS PRN. Will also incorporate kinesiotape, myofascial release PRN, scar massage PRN    Recommended Other Services fit with comfortable and effective gradient comptression garments. It;s likely Pt will need custom sleeve and glove, ccl 2 flat knit    Consulted and Agree with Plan of Care Patient             Patient will benefit from skilled therapeutic intervention in order to improve the following deficits and impairments:   Body Structure / Function / Physical Skills: ADL, Decreased knowledge of use of DME, Pain, Edema, UE functional use, IADL, ROM, Fascial restriction, Scar mobility, Skin integrity, Decreased knowledge of precautions       Visit Diagnosis: Postmastectomy lymphedema syndrome    Problem List Patient Active Problem List   Diagnosis Date Noted   Goals of care, counseling/discussion 05/17/2020   Breast cancer, right ([place]) 05/08/2020  Kristy Spearman, MS, OTR/L, [facility] 03/14/21 4:14 PM     [place] MAIN [facility] SERVICES [address] [place], [place], [address] Phone: [phone]   Fax:   [phone]  Name: Kristy York MRN: [id number] Date of Birth: [birth year]

## 2021-03-15 ENCOUNTER — Ambulatory Visit: Payer: Medicare PPO | Admitting: Occupational Therapy

## 2021-03-18 ENCOUNTER — Ambulatory Visit: Payer: Medicare PPO | Admitting: Occupational Therapy

## 2021-03-18 ENCOUNTER — Other Ambulatory Visit: Payer: Self-pay

## 2021-03-18 DIAGNOSIS — I972 Postmastectomy lymphedema syndrome: Secondary | ICD-10-CM | POA: Diagnosis not present

## 2021-03-18 NOTE — Therapy (Signed)
[place] MAIN [facility] SERVICES [address] [place], [place], [address] Phone: [phone]   Fax:  [phone]  Occupational Therapy Treatment  Patient Details  Name: Kristy York MRN: [id number] Date of Birth: Oct 25, 1950 Referring Provider (OT): Delight Hoh, MD   Encounter Date: 03/18/2021   OT End of Session - 03/18/21 1427     Visit Number 14    Number of Visits 36    Date for OT Re-Evaluation 04/14/21    Authorization Type 16 visits initially authorized    OT Start Time 0215    OT Stop Time 0325    OT Time Calculation (min) 70 min    Activity Tolerance Patient tolerated treatment well;No increased pain    Behavior During Therapy WFL for tasks assessed/performed             Past Medical History:  Diagnosis Date   Anxiety    Arthritis    Diet-controlled diabetes mellitus ([place])    borderline   Fatigue    HLD (hyperlipidemia)    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; ER/PR (+), HER2/neu (-)   Metastasis to bone (HCC)    Breast primary   Murmur, cardiac    Peripheral edema    T wave inversion on electrocardiogram 05/17/2020   leads I, II, III, aVF, V3-V6    Past Surgical History:  Procedure Laterality Date   BREAST BIOPSY Right 05/02/2020   [place] Bx, Q clip, path pending   BREAST BIOPSY Right 05/02/2020   [place] Axilla Bx, path pending   CHOLECYSTECTOMY     COLONOSCOPY     LAPAROSCOPIC ABDOMINAL EXPLORATION     MASTECTOMY MODIFIED RADICAL Right 05/23/2020   Procedure: MASTECTOMY MODIFIED RADICAL, WITH EXCISION OF AXILLARY CONTENTS;  Surgeon: Robert Bellow, MD;  Location: [facility] ORS;  Service: General;  Laterality: Right;   PORTACATH PLACEMENT Left 05/23/2020   Procedure: INSERTION PORT-A-CATH;  Surgeon: Robert Bellow, MD;  Location: [facility] ORS;  Service: General;  Laterality: Left;    There were no vitals filed for this visit.   Subjective Assessment - 03/18/21 1541     Subjective  Kristy York presents for OT visit 14/36 to address RUE/RUQ post-mastectomy lymphedema with gradient compression wraps in place on RUE. She is accompanied by her sister. Pt denies LE related pain in the affected RUE/ RUQ. Pt reports she remains compliant with wraps, despite bandages causing her arm to sweat and feel very uncomfortable due to the very hot summer weather.    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 anxiety, arthritis, diet controlled diabetes mellitus, fatique, , heart murmer, periferal edema in feet and legs,    Limitations decreased RUE shoulder AROM, chronic RUE swelling and associated pain, fatigue    Repetition Increases Symptoms    Special Tests _ Stemmer R MPs    Pain Onset 1 to 4 weeks ago   3 weeks ago                         OT Treatments/Exercises (OP) - 03/18/21 1547       ADLs   ADL Education Given Yes      Manual Therapy   Manual Therapy Edema  management;Compression Bandaging    Manual Lymphatic Drainage (MLD) MLD to RUE/RUQ with emphasis on upper arm today.Next visit will focus on trunk and chest wall directing lymphatic congestion alternatively to adjacent L axilla and R inguinal anastamosis.    Compression Bandaging multi layer compression wraps using gradient technique. Applied fingerbandages leaving palm free. Applied single layer of 0.4 cm thick Rosidal foam over cotton stockinett, then short stretch wraps ( 10 cm ) in gradient conmfiguration to axilla.                    OT Education - 03/18/21 1548     Education Details Continued Pt/ CG edu for lymphedema self care  and home program throughout session. Topics include multilayer, gradient compression wrapping, simple self-MLD, therapeutic lymphatic  pumping exercises, skin/nail care, risk reduction factors and LE precautions, compression garments/recommendations and wear and care schedule and compression garment donning / doffing using assistive devices. All questions answered to the Pt's satisfaction, and Pt demonstrates understanding by report.    Person(s) Educated Child(ren);Patient   [name]   Methods Explanation;Demonstration;Handout    Comprehension Verbalized understanding;Need further instruction                 OT Long Term Goals - 02/22/21 1056       OT LONG TERM GOAL #1   Title Given this patient's risk-adjustment variables, her Intake Functional Status score of 61/100 on the FOTO tool, patient will experience at least an increase in function of 11 points for a score of 72/100.    Baseline 61/100    Time 12    Period Weeks    Status On-going    Target Date 04/14/21      OT LONG TERM GOAL #2   Title Pt will be able to apply multi-layer, short stretch compression wraps using correct gradient techniques with max caregiver assistance to return affected R limb to premorbid size and shape, to limit pain and infection risk, and to improve functional arm and hand use for ADLs.    Baseline dependent    Time 4    Period Days    Status Achieved      OT LONG TERM GOAL #3   Title With Max  CG Assist during visit intervals Pt  will achieve at least a 10%  limb volume reduction  in the R arm and hand during Intensive Phase CDT to achieve optimal limb volume reduction, to prevent re-accumulation of lymphatic congestion and progression of fibrosis, to limit infection risk, to improve functional arm/hand use, to improve functional performance of basic and instrumental ADLs, and to limit LE progression.    Baseline dependent    Time 12    Period Weeks    Status Achieved   02/22/21 Acieved with 25.3% limb volume reduction since 01/18/21. LVD now WNL at 3.06%, R (dominant/ Rx) > L     OT LONG TERM GOAL #4   Title With Max daily CG  assistance Pt will achieve and sustain a least 85% compliance performing all daily LE self-care home program components throughout Intensive Phase CDT, including recommended skin care regime, lymphatic pumping ther ex, 23/7 compression wraps and simple self-MLD, to ensure optimal limb volume reduction, to limit infection risk and to limit LE progression.    Baseline dependent    Time 12    Period Weeks    Status Achieved      OT LONG TERM GOAL #5   Title By DC from OT Pt  will be able to don and doff appropriate daytime compression garments and HOS devices with Mod CG assistance to limit lymphatic re-accumulation and LE progression with before transitioning to self-management phase of CDT.    Baseline Max A    Time 12    Period Weeks    Status On-going    Target Date 04/14/21      OT LONG TERM GOAL #6   Title Pt will regain full RUE AROM  for flexion and abduction essential for optimal functional performance of basic and instrumental ADLs, productive activities,  and leisure pursuits by DC from OT.    Baseline Max A    Time 12    Period Weeks    Status On-going    Target Date 04/14/21                   Plan - 03/18/21 1549     Clinical Impression Statement Provided RLE MLD in supine utilizing anterior axillary anastamosis as established. Applied fibrosis techiques to medial volar fa to soften doughy fibrosis . Pt continues to tolerate all aspects of OT for lymphedema management. She has conmsistent assistance at home with compression wrapping and limb volum e is well managed without reaccumulation between visits. Custom sleeve and glove have been orderewd and we are awaiting delivery from vendor for fitting. Cont as per POC.    OT Occupational Profile and History Comprehensive Assessment- Review of records and extensive additional review of physical, cognitive, psychosocial history related to current functional performance    Occupational performance deficits (Please refer to  evaluation for details): ADL's;IADL's;Leisure;Work;Social Participation;Rest and Sleep    Body Structure / Function / Physical Skills ADL;Decreased knowledge of use of DME;Pain;Edema;UE functional use;IADL;ROM;Fascial restriction;Scar mobility;Skin integrity;Decreased knowledge of precautions    Rehab Potential Good    Clinical Decision Making Several treatment options, min-mod task modification necessary    Comorbidities Affecting Occupational Performance: Presence of comorbidities impacting occupational performance    Modification or Assistance to Complete Evaluation  Min-Moderate modification of tasks or assist with assess necessary to complete eval    OT Frequency 2x / week    OT Duration 12 weeks   and PRN for follow along support and garment replacement   OT Treatment/Interventions Self-care/ADL training;DME and/or AE instruction;Manual lymph drainage;Therapeutic activities;Compression bandaging;Therapeutic exercise;Scar mobilization;Coping strategies training;Manual Therapy;Patient/family education    Plan Complete Decongestive Therapy to RUE/RUQ to include manual lymphatic drainage a(MLD), skin care with low ph lotion/ castor oil , therapeutic lymphatic pumping exercise, compression wraps graduating to compression garments for daytime and device for HOS PRN. Will also incorporate kinesiotape, myofascial release PRN, scar massage PRN    Recommended Other Services fit with comfortable and effective gradient comptression garments. It;s likely Pt will need custom sleeve and glove, ccl 2 flat knit    Consulted and Agree with Plan of Care Patient             Patient will benefit from skilled therapeutic intervention in order to improve the following deficits and impairments:   Body Structure / Function / Physical Skills: ADL, Decreased knowledge of use of DME, Pain, Edema, UE functional use, IADL, ROM, Fascial restriction, Scar mobility, Skin integrity, Decreased knowledge of precautions        Visit Diagnosis: Postmastectomy lymphedema syndrome    Problem List Patient Active Problem List   Diagnosis Date Noted   Goals of care, counseling/discussion 05/17/2020   Breast cancer, right ([place]) 05/08/2020    Andrey Spearman, MS, OTR/L, [facility] 03/18/21  3:52 PM   [place] MAIN [facility] SERVICES [address] [place], [place], [address] Phone: [phone]   Fax:  [phone]  Name: Kristy York MRN: [id number] Date of Birth: 05-30-1951

## 2021-03-20 ENCOUNTER — Encounter: Payer: Medicare PPO | Admitting: Occupational Therapy

## 2021-03-22 ENCOUNTER — Ambulatory Visit: Payer: Medicare PPO | Admitting: Occupational Therapy

## 2021-03-22 ENCOUNTER — Other Ambulatory Visit: Payer: Self-pay

## 2021-03-22 DIAGNOSIS — I972 Postmastectomy lymphedema syndrome: Secondary | ICD-10-CM | POA: Diagnosis not present

## 2021-03-22 NOTE — Therapy (Signed)
[place] MAIN [facility] SERVICES [address] [place], [place], [address] Phone: [phone]   Fax:  [phone]  Occupational Therapy Treatment  Patient Details  Name: Kristy York MRN: [id number] Date of Birth: [birth year] Referring Provider (OT): Delight Hoh, MD   Encounter Date: 03/22/2021   OT End of Session - 03/22/21 1117     Visit Number 15    Number of Visits 36    Date for OT Re-Evaluation 04/14/21    Authorization Type 16 visits initially authorized    OT Start Time 1108    OT Stop Time 1210    OT Time Calculation (min) 62 min    Activity Tolerance Patient tolerated treatment well;No increased pain    Behavior During Therapy WFL for tasks assessed/performed             Past Medical History:  Diagnosis Date   Anxiety    Arthritis    Diet-controlled diabetes mellitus ([place])    borderline   Fatigue    HLD (hyperlipidemia)    Invasive carcinoma of breast ([place]) 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; ER/PR (+), HER2/neu (-)   Metastasis to bone ([facility])    Breast primary   Murmur, cardiac    Peripheral edema    T wave inversion on electrocardiogram 05/17/2020   leads I, II, III, aVF, V3-V6    Past Surgical History:  Procedure Laterality Date   BREAST BIOPSY Right 05/02/2020   [place] Bx, Q clip, path pending   BREAST BIOPSY Right 05/02/2020   [place] Axilla Bx, path pending   CHOLECYSTECTOMY     COLONOSCOPY     LAPAROSCOPIC ABDOMINAL EXPLORATION     MASTECTOMY MODIFIED RADICAL Right 05/23/2020   Procedure: MASTECTOMY MODIFIED RADICAL, WITH EXCISION OF AXILLARY CONTENTS;  Surgeon: Robert Bellow, MD;  Location: [facility] ORS;  Service: General;  Laterality: Right;   PORTACATH PLACEMENT Left 05/23/2020   Procedure: INSERTION PORT-A-CATH;  Surgeon: Robert Bellow, MD;  Location: [facility] ORS;  Service: General;  Laterality: Left;    There were no vitals filed for this visit.   Subjective Assessment - 03/22/21 1225     Subjective  Kristy York presents for OT visit 15`/36 to address RUE/RUQ post-mastectomy lymphedema with gradient compression wraps in place on RUE. She is accompanied by her son, Kristy York.  Pt denies LE related pain in the affected RUE/ RUQ. Pt denies any new concerns. Her son request review of lymphatic structure and function to better understand etiology and Rx protocol.    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 anxiety, arthritis, diet controlled diabetes mellitus, fatique, , heart murmer, periferal edema in feet and legs,    Limitations decreased RUE shoulder AROM, chronic RUE swelling and associated pain, fatigue    Repetition Increases Symptoms    Special Tests _ Stemmer R MPs    Pain Onset 1 to 4 weeks ago   3 weeks ago                         OT Treatments/Exercises (OP) - 03/22/21 1226       ADLs   ADL Education Given Yes      Manual Therapy   Manual Therapy Edema management;Compression Bandaging  Manual Lymphatic Drainage (MLD) MLD to RUE/RUQ with emphasis on upper arm today.Next visit will focus on trunk and chest wall directing lymphatic congestion alternatively to adjacent L axilla and R inguinal anastamosis.    Compression Bandaging multi layer compression wraps using gradient technique. Applied fingerbandages leaving palm free. Applied single layer of 0.4 cm thick Rosidal foam over cotton stockinett, then short stretch wraps ( 10 cm ) in gradient conmfiguration to axilla.                    OT Education - 03/22/21 1118     Education Details Pt and family edu for lymphatic structure and function and how CDT facilitates increased fluid return to limit progression.    Person(s) Educated Child(ren);Patient   Kristy York   Methods  Explanation;Demonstration;Handout    Comprehension Verbalized understanding;Need further instruction                 OT Long Term Goals - 02/22/21 1056       OT LONG TERM GOAL #1   Title Given this patient's risk-adjustment variables, her Intake Functional Status score of 61/100 on the FOTO tool, patient will experience at least an increase in function of 11 points for a score of 72/100.    Baseline 61/100    Time 12    Period Weeks    Status On-going    Target Date 04/14/21      OT LONG TERM GOAL #2   Title Pt will be able to apply multi-layer, short stretch compression wraps using correct gradient techniques with max caregiver assistance to return affected R limb to premorbid size and shape, to limit pain and infection risk, and to improve functional arm and hand use for ADLs.    Baseline dependent    Time 4    Period Days    Status Achieved      OT LONG TERM GOAL #3   Title With Max  CG Assist during visit intervals Pt  will achieve at least a 10%  limb volume reduction  in the R arm and hand during Intensive Phase CDT to achieve optimal limb volume reduction, to prevent re-accumulation of lymphatic congestion and progression of fibrosis, to limit infection risk, to improve functional arm/hand use, to improve functional performance of basic and instrumental ADLs, and to limit LE progression.    Baseline dependent    Time 12    Period Weeks    Status Achieved   02/22/21 Acieved with 25.3% limb volume reduction since 01/18/21. LVD now WNL at 3.06%, R (dominant/ Rx) > L     OT LONG TERM GOAL #4   Title With Max daily CG assistance Pt will achieve and sustain a least 85% compliance performing all daily LE self-care home program components throughout Intensive Phase CDT, including recommended skin care regime, lymphatic pumping ther ex, 23/7 compression wraps and simple self-MLD, to ensure optimal limb volume reduction, to limit infection risk and to limit LE progression.     Baseline dependent    Time 12    Period Weeks    Status Achieved      OT LONG TERM GOAL #5   Title By DC from OT Pt will be able to don and doff appropriate daytime compression garments and HOS devices with Mod CG assistance to limit lymphatic re-accumulation and LE progression with before transitioning to self-management phase of CDT.    Baseline Max A    Time 12    Period Weeks  Status On-going    Target Date 04/14/21      OT LONG TERM GOAL #6   Title Pt will regain full RUE AROM  for flexion and abduction essential for optimal functional performance of basic and instrumental ADLs, productive activities,  and leisure pursuits by DC from OT.    Baseline Max A    Time 12    Period Weeks    Status On-going    Target Date 04/14/21                   Plan - 03/22/21 1223     Clinical Impression Statement Pt tolerated RUE MLD, skin care and gradient compression wrapping today without increased pain. Reviewed lymphatic structure and function by patient and family request and related how each treatment component facilitates lymphatic function.  Fit custom compression ASAP and cont as per POC.    OT Occupational Profile and History Comprehensive Assessment- Review of records and extensive additional review of physical, cognitive, psychosocial history related to current functional performance    Occupational performance deficits (Please refer to evaluation for details): ADL's;IADL's;Leisure;Work;Social Participation;Rest and Sleep    Body Structure / Function / Physical Skills ADL;Decreased knowledge of use of DME;Pain;Edema;UE functional use;IADL;ROM;Fascial restriction;Scar mobility;Skin integrity;Decreased knowledge of precautions    Rehab Potential Good    Clinical Decision Making Several treatment options, min-mod task modification necessary    Comorbidities Affecting Occupational Performance: Presence of comorbidities impacting occupational performance    Modification or  Assistance to Complete Evaluation  Min-Moderate modification of tasks or assist with assess necessary to complete eval    OT Frequency 2x / week    OT Duration 12 weeks   and PRN for follow along support and garment replacement   OT Treatment/Interventions Self-care/ADL training;DME and/or AE instruction;Manual lymph drainage;Therapeutic activities;Compression bandaging;Therapeutic exercise;Scar mobilization;Coping strategies training;Manual Therapy;Patient/family education    Plan Complete Decongestive Therapy to RUE/RUQ to include manual lymphatic drainage a(MLD), skin care with low ph lotion/ castor oil , therapeutic lymphatic pumping exercise, compression wraps graduating to compression garments for daytime and device for HOS PRN. Will also incorporate kinesiotape, myofascial release PRN, scar massage PRN    Recommended Other Services fit with comfortable and effective gradient comptression garments. It;s likely Pt will need custom sleeve and glove, ccl 2 flat knit    Consulted and Agree with Plan of Care Patient             Patient will benefit from skilled therapeutic intervention in order to improve the following deficits and impairments:   Body Structure / Function / Physical Skills: ADL, Decreased knowledge of use of DME, Pain, Edema, UE functional use, IADL, ROM, Fascial restriction, Scar mobility, Skin integrity, Decreased knowledge of precautions       Visit Diagnosis: Postmastectomy lymphedema syndrome    Problem List Patient Active Problem List   Diagnosis Date Noted   Goals of care, counseling/discussion 05/17/2020   Breast cancer, right ([place]) 05/08/2020   Andrey Spearman, MS, OTR/L, [facility] 03/22/21 12:28 PM    [place] MAIN [facility] SERVICES [address] [place], [place], [address] Phone: [phone]   Fax:  [phone]  Name: Kristy York MRN: [id number] Date of Birth: [birth year]

## 2021-03-25 ENCOUNTER — Ambulatory Visit: Payer: Medicare PPO | Admitting: Occupational Therapy

## 2021-03-25 ENCOUNTER — Other Ambulatory Visit: Payer: Self-pay

## 2021-03-25 DIAGNOSIS — I972 Postmastectomy lymphedema syndrome: Secondary | ICD-10-CM | POA: Diagnosis not present

## 2021-03-25 NOTE — Therapy (Signed)
[place] MAIN [facility] SERVICES [address] [place], [place], [address] Phone: [phone]   Fax:  [phone]  Occupational Therapy Treatment  Patient Details  Name: Kristy York MRN: [id number] Date of Birth: [birth year] Referring Provider (OT): Delight Hoh, MD   Encounter Date: 03/25/2021   OT End of Session - 03/25/21 1459     Visit Number 16    Number of Visits 36    Date for OT Re-Evaluation 04/14/21    Authorization Type 16 visits initially authorized    OT Start Time 0200    OT Stop Time 0320    OT Time Calculation (min) 80 min    Equipment Utilized During Treatment multiple examples of convoluted foam HOS devices to facilitate improved lymphatic flow during HOS    Activity Tolerance Patient tolerated treatment well;No increased pain    Behavior During Therapy WFL for tasks assessed/performed             Past Medical History:  Diagnosis Date   Anxiety    Arthritis    Diet-controlled diabetes mellitus ([place])    borderline   Fatigue    HLD (hyperlipidemia)    Invasive carcinoma of breast ([place]) 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; ER/PR (+), HER2/neu (-)   Metastasis to bone ([facility])    Breast primary   Murmur, cardiac    Peripheral edema    T wave inversion on electrocardiogram 05/17/2020   leads I, II, III, aVF, V3-V6    Past Surgical History:  Procedure Laterality Date   BREAST BIOPSY Right 05/02/2020   [place] Bx, Q clip, path pending   BREAST BIOPSY Right 05/02/2020   [place] Axilla Bx, path pending   CHOLECYSTECTOMY     COLONOSCOPY     LAPAROSCOPIC ABDOMINAL EXPLORATION     MASTECTOMY MODIFIED RADICAL Right 05/23/2020   Procedure: MASTECTOMY MODIFIED RADICAL, WITH EXCISION OF AXILLARY CONTENTS;  Surgeon: Robert Bellow, MD;  Location: [facility] ORS;  Service: General;  Laterality: Right;   PORTACATH PLACEMENT Left 05/23/2020   Procedure: INSERTION PORT-A-CATH;  Surgeon: Robert Bellow, MD;  Location: [facility] ORS;  Service: General;   Laterality: Left;    There were no vitals filed for this visit.   Subjective Assessment - 03/25/21 1514     Subjective  Kristy York presents for OT visit 16`/36 to address RUE/RUQ post-mastectomy lymphedema with gradient compression wraps in place on RUE.  Pt denies LE related pain in the affected RUE/ RUQ. Pt denies any new concerns. She brings clean wraps to clinic.    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 anxiety, arthritis, diet controlled diabetes mellitus, fatique, , heart murmer, periferal edema in feet and legs,    Limitations decreased RUE shoulder AROM, chronic RUE swelling and associated pain, fatigue    Repetition Increases Symptoms    Special Tests _ Stemmer R MPs    Pain Onset 1 to 4 weeks ago   3 weeks ago                         OT Treatments/Exercises (OP) - 03/25/21 1524       ADLs   ADL Education Given Yes      Manual Therapy   Manual  Therapy Edema management;Compression Bandaging    Manual Lymphatic Drainage (MLD) MLD to RUE/RUQ with emphasis on upper arm today.Next visit will focus on trunk and chest wall directing lymphatic congestion alternatively to adjacent L axilla and R inguinal anastamosis.    Compression Bandaging multi layer compression wraps using gradient technique.  Applied single layer of 0.4 cm thick Rosidal foam over cotton stockinett, then short stretch wraps ( 10 cm ) in gradient conmfiguration to axilla.                    OT Education - 03/25/21 1525     Education Details Pt edu re HOS device purpose and options based on samples we demonstrated today.    Person(s) Educated Patient   Kristy York   Methods Explanation;Demonstration;Handout    Comprehension Verbalized  understanding;Need further instruction;Returned demonstration                 OT Long Term Goals - 02/22/21 1056       OT LONG TERM GOAL #1   Title Given this patient's risk-adjustment variables, her Intake Functional Status score of 61/100 on the FOTO tool, patient will experience at least an increase in function of 11 points for a score of 72/100.    Baseline 61/100    Time 12    Period Weeks    Status On-going    Target Date 04/14/21      OT LONG TERM GOAL #2   Title Pt will be able to apply multi-layer, short stretch compression wraps using correct gradient techniques with max caregiver assistance to return affected R limb to premorbid size and shape, to limit pain and infection risk, and to improve functional arm and hand use for ADLs.    Baseline dependent    Time 4    Period Days    Status Achieved      OT LONG TERM GOAL #3   Title With Max  CG Assist during visit intervals Pt  will achieve at least a 10%  limb volume reduction  in the R arm and hand during Intensive Phase CDT to achieve optimal limb volume reduction, to prevent re-accumulation of lymphatic congestion and progression of fibrosis, to limit infection risk, to improve functional arm/hand use, to improve functional performance of basic and instrumental ADLs, and to limit LE progression.    Baseline dependent    Time 12    Period Weeks    Status Achieved   02/22/21 Acieved with 25.3% limb volume reduction since 01/18/21. LVD now WNL at 3.06%, R (dominant/ Rx) > L     OT LONG TERM GOAL #4   Title With Max daily CG assistance Pt will achieve and sustain a least 85% compliance performing all daily LE self-care home program components throughout Intensive Phase CDT, including recommended skin care regime, lymphatic pumping ther ex, 23/7 compression wraps and simple self-MLD, to ensure optimal limb volume reduction, to limit infection risk and to limit LE progression.    Baseline dependent    Time 12    Period  Weeks    Status Achieved      OT LONG TERM GOAL #5   Title By DC from OT Pt will be able to don and doff appropriate daytime compression garments and HOS devices with Mod CG assistance to limit lymphatic re-accumulation and LE progression with before transitioning to self-management phase of CDT.    Baseline Max A    Time 12    Period Weeks  Status On-going    Target Date 04/14/21      OT LONG TERM GOAL #6   Title Pt will regain full RUE AROM  for flexion and abduction essential for optimal functional performance of basic and instrumental ADLs, productive activities,  and leisure pursuits by DC from OT.    Baseline Max A    Time 12    Period Weeks    Status On-going    Target Date 04/14/21                   Plan - 03/25/21 1500     Clinical Impression Statement Continued today wity MLD, skin care and gradient cmpression wraps to RLE to reduce limb volume and limit fluid  re accumulation while awaiting delivery of custom compression garments a few weeks ago. OT emailed vendor requesting ETA. Pt continues to manage LE self care home program between visits with her son's assistance. Cont as per POC.    OT Occupational Profile and History Comprehensive Assessment- Review of records and extensive additional review of physical, cognitive, psychosocial history related to current functional performance    Occupational performance deficits (Please refer to evaluation for details): ADL's;IADL's;Leisure;Work;Social Participation;Rest and Sleep    Body Structure / Function / Physical Skills ADL;Decreased knowledge of use of DME;Pain;Edema;UE functional use;IADL;ROM;Fascial restriction;Scar mobility;Skin integrity;Decreased knowledge of precautions    Rehab Potential Good    Clinical Decision Making Several treatment options, min-mod task modification necessary    Comorbidities Affecting Occupational Performance: Presence of comorbidities impacting occupational performance    Modification  or Assistance to Complete Evaluation  Min-Moderate modification of tasks or assist with assess necessary to complete eval    OT Frequency 2x / week    OT Duration 12 weeks   and PRN for follow along support and garment replacement   OT Treatment/Interventions Self-care/ADL training;DME and/or AE instruction;Manual lymph drainage;Therapeutic activities;Compression bandaging;Therapeutic exercise;Scar mobilization;Coping strategies training;Manual Therapy;Patient/family education    Plan Complete Decongestive Therapy to RUE/RUQ to include manual lymphatic drainage a(MLD), skin care with low ph lotion/ castor oil , therapeutic lymphatic pumping exercise, compression wraps graduating to compression garments for daytime and device for HOS PRN. Will also incorporate kinesiotape, myofascial release PRN, scar massage PRN    Recommended Other Services fit with comfortable and effective gradient comptression garments. It;s likely Pt will need custom sleeve and glove, ccl 2 flat knit    Consulted and Agree with Plan of Care Patient             Patient will benefit from skilled therapeutic intervention in order to improve the following deficits and impairments:   Body Structure / Function / Physical Skills: ADL, Decreased knowledge of use of DME, Pain, Edema, UE functional use, IADL, ROM, Fascial restriction, Scar mobility, Skin integrity, Decreased knowledge of precautions       Visit Diagnosis: Postmastectomy lymphedema syndrome    Problem List Patient Active Problem List   Diagnosis Date Noted   Goals of care, counseling/discussion 05/17/2020   Breast cancer, right ([place]) 05/08/2020    Andrey Spearman, MS, OTR/L, [facility] 03/25/21 3:27 PM    [place] MAIN [facility] SERVICES [address] [place], [place], [address] Phone: [phone]   Fax:  [phone]  Name: Kristy York MRN: [id number] Date of Birth: 03-29-1951

## 2021-03-26 ENCOUNTER — Inpatient Hospital Stay: Payer: Medicare PPO | Attending: Oncology

## 2021-03-26 ENCOUNTER — Inpatient Hospital Stay (HOSPITAL_BASED_OUTPATIENT_CLINIC_OR_DEPARTMENT_OTHER): Payer: Medicare PPO | Admitting: Oncology

## 2021-03-26 ENCOUNTER — Encounter: Payer: Self-pay | Admitting: Oncology

## 2021-03-26 ENCOUNTER — Inpatient Hospital Stay: Payer: Medicare PPO

## 2021-03-26 ENCOUNTER — Other Ambulatory Visit: Payer: Self-pay

## 2021-03-26 VITALS — BP 130/69 | HR 78 | Temp 97.5°F | Ht 62.0 in | Wt 173.3 lb

## 2021-03-26 DIAGNOSIS — C7951 Secondary malignant neoplasm of bone: Secondary | ICD-10-CM | POA: Insufficient documentation

## 2021-03-26 DIAGNOSIS — Z79811 Long term (current) use of aromatase inhibitors: Secondary | ICD-10-CM | POA: Insufficient documentation

## 2021-03-26 DIAGNOSIS — C50911 Malignant neoplasm of unspecified site of right female breast: Secondary | ICD-10-CM

## 2021-03-26 DIAGNOSIS — Z170 Estrogen receptor positive status [ER+]: Secondary | ICD-10-CM

## 2021-03-26 LAB — BASIC METABOLIC PANEL
Anion gap: 8 (ref 5–15)
BUN: 15 mg/dL (ref 8–23)
CO2: 27 mmol/L (ref 22–32)
Calcium: 8.7 mg/dL — ABNORMAL LOW (ref 8.9–10.3)
Chloride: 103 mmol/L (ref 98–111)
Creatinine, Ser: 0.87 mg/dL (ref 0.44–1.00)
GFR, Estimated: >60 mL/min (ref >60)
Glucose, Bld: 107 mg/dL — ABNORMAL HIGH (ref 70–99)
Potassium: 3.8 mmol/L (ref 3.5–5.1)
Sodium: 138 mmol/L (ref 135–145)

## 2021-03-26 MED ORDER — HEPARIN SOD (PORK) LOCK FLUSH 100 UNIT/ML IV SOLN
250.0000 [IU] | Freq: Once | INTRAVENOUS | Status: DC | PRN
  Filled 2021-03-26: qty 5

## 2021-03-26 MED ORDER — SODIUM CHLORIDE 0.9 % IV SOLN
Freq: Once | INTRAVENOUS | Status: AC
  Filled 2021-03-26: qty 250

## 2021-03-26 MED ORDER — SODIUM CHLORIDE 0.9% FLUSH
10.0000 mL | INTRAVENOUS | Status: DC | PRN
  Administered 2021-03-26: 10 mL via INTRAVENOUS
  Filled 2021-03-26: qty 10

## 2021-03-26 MED ORDER — ZOLEDRONIC ACID 4 MG/100ML IV SOLN
4.0000 mg | Freq: Once | INTRAVENOUS | Status: AC
  Administered 2021-03-26: 4 mg via INTRAVENOUS
  Filled 2021-03-26: qty 100

## 2021-03-26 MED ORDER — HEPARIN SOD (PORK) LOCK FLUSH 100 UNIT/ML IV SOLN
INTRAVENOUS | Status: AC
  Filled 2021-03-26: qty 5

## 2021-03-26 MED ORDER — HEPARIN SOD (PORK) LOCK FLUSH 100 UNIT/ML IV SOLN
500.0000 [IU] | Freq: Once | INTRAVENOUS | Status: AC
  Administered 2021-03-26: 500 [IU] via INTRAVENOUS
  Filled 2021-03-26: qty 5

## 2021-03-26 NOTE — Progress Notes (Signed)
Pt received IV zometa in clinic today. Tolerated well. No complaints at d/c.

## 2021-03-26 NOTE — Patient Instructions (Signed)
[facility]   Discharge Instructions: Thank you for choosing [facility] to provide your oncology and hematology care.  If you have a lab appointment with the [facility], please go directly to the [facility] and check in at the registration area.  Wear comfortable clothing and clothing appropriate for easy access to any Portacath or PICC line.   We strive to give you quality time with your provider. You may need to reschedule your appointment if you arrive late (15 or more minutes).  Arriving late affects you and other patients whose appointments are after yours.  Also, if you miss three or more appointments without notifying the office, you may be dismissed from the clinic at the provider's discretion.      For prescription refill requests, have your pharmacy contact our office and allow 72 hours for refills to be completed.     To help prevent nausea and vomiting after your treatment, we encourage you to take your nausea medication as directed.  BELOW ARE SYMPTOMS THAT SHOULD BE REPORTED IMMEDIATELY: *FEVER GREATER THAN 100.4 F (38 C) OR HIGHER *CHILLS OR SWEATING *NAUSEA AND VOMITING THAT IS NOT CONTROLLED WITH YOUR NAUSEA MEDICATION *UNUSUAL SHORTNESS OF BREATH *UNUSUAL BRUISING OR BLEEDING *URINARY PROBLEMS (pain or burning when urinating, or frequent urination) *BOWEL PROBLEMS (unusual diarrhea, constipation, pain near the anus) TENDERNESS IN MOUTH AND THROAT WITH OR WITHOUT PRESENCE OF ULCERS (sore throat, sores in mouth, or a toothache) UNUSUAL RASH, SWELLING OR PAIN  UNUSUAL VAGINAL DISCHARGE OR ITCHING   Items with * indicate a potential emergency and should be followed up as soon as possible or go to the Emergency Department if any problems should occur.  Please show the CHEMOTHERAPY ALERT CARD or IMMUNOTHERAPY ALERT CARD at check-in to the Emergency Department and triage nurse.  Should you have questions after your  visit or need to cancel or reschedule your appointment, please contact [facility]  [phone] and follow the prompts.  Office hours are 8:00 a.m. to 4:30 p.m. Monday - Friday. Please note that voicemails left after 4:00 p.m. may not be returned until the following business day.  We are closed weekends and major holidays. You have access to a nurse at all times for urgent questions. Please call the main number to the clinic [phone] and follow the prompts.  For any non-urgent questions, you may also contact your provider using MyChart. We now offer e-Visits for anyone 85 and older to request care online for non-urgent symptoms. For details visit [email or web address].   Also download the MyChart app! Go to the app store, search "MyChart", open the app, select [facility], and log in with your MyChart username and password.  Due to Covid, a mask is required upon entering the hospital/clinic. If you do not have a mask, one will be given to you upon arrival. For doctor visits, patients may have 1 support person aged 81 or older with them. For treatment visits, patients cannot have anyone with them due to current Covid guidelines and our immunocompromised population.   Zoledronic Acid Injection (Hypercalcemia, Oncology) What is this medication? ZOLEDRONIC ACID (ZOE le dron ik AS id) slows calcium loss from bones. It high calcium levels in the blood from some kinds of cancer. It may be used in otherpeople at risk for bone loss. This medicine may be used for other purposes; ask your health care provider orpharmacist if you have questions. COMMON BRAND NAME(S): Zometa What  should I tell my care team before I take this medication? They need to know if you have any of these conditions: cancer dehydration dental disease kidney disease liver disease low levels of calcium in the blood lung or breathing disease (asthma) receiving steroids like dexamethasone or  prednisone an unusual or allergic reaction to zoledronic acid, other medicines, foods, dyes, or preservatives pregnant or trying to get pregnant breast-feeding How should I use this medication? This drug is injected into a vein. It is given by a health care provider in [place] or clinic setting. Talk to your health care provider about the use of this drug in children.Special care may be needed. Overdosage: If you think you have taken too much of this medicine contact apoison control center or emergency room at once. NOTE: This medicine is only for you. Do not share this medicine with others. What if I miss a dose? Keep appointments for follow-up doses. It is important not to miss your dose.Call your health care provider if you are unable to keep an appointment. What may interact with this medication? certain antibiotics given by injection NSAIDs, medicines for pain and inflammation, like ibuprofen or naproxen some diuretics like bumetanide, furosemide teriparatide thalidomide This list may not describe all possible interactions. Give your health care provider a list of all the medicines, herbs, non-prescription drugs, or dietary supplements you use. Also tell them if you smoke, drink alcohol, or use illegaldrugs. Some items may interact with your medicine. What should I watch for while using this medication? Visit your health care provider for regular checks on your progress. It may besome time before you see the benefit from this drug. Some people who take this drug have severe bone, joint, or muscle pain. This drug may also increase your risk for jaw problems or a broken thigh bone. Tell your health care provider right away if you have severe pain in your jaw, bones, joints, or muscles. Tell you health care provider if you have any painthat does not go away or that gets worse. Tell your dentist and dental surgeon that you are taking this drug. You should not have major dental surgery while  on this drug. See your dentist to have a dental exam and fix any dental problems before starting this drug. Take good care of your teeth while on this drug. Make sure you see your dentist forregular follow-up appointments. You should make sure you get enough calcium and vitamin D while you are taking this drug. Discuss the foods you eat and the vitamins you take with your healthcare provider. Check with your health care provider if you have severe diarrhea, nausea, and vomiting, or if you sweat a lot. The loss of too much body fluid may make itdangerous for you to take this drug. You may need blood work done while you are taking this drug. Do not become pregnant while taking this drug. Women should inform their health care provider if they wish to become pregnant or think they might be pregnant. There is potential for serious harm to an unborn child. Talk to your healthcare provider for more information. What side effects may I notice from receiving this medication? Side effects that you should report to your doctor or health care provider assoon as possible: allergic reactions (skin rash, itching or hives; swelling of the face, lips, or tongue) bone pain infection (fever, chills, cough, sore throat, pain or trouble passing urine) jaw pain, especially after dental work joint pain kidney injury (trouble passing urine  or change in the amount of urine) low blood pressure (dizziness; feeling faint or lightheaded, falls; unusually weak or tired) low calcium levels (fast heartbeat; muscle cramps or pain; pain, tingling, or numbness in the hands or feet; seizures) low magnesium levels (fast, irregular heartbeat; muscle cramp or pain; muscle weakness; tremors; seizures) low red blood cell counts (trouble breathing; feeling faint; lightheaded, falls; unusually weak or tired) muscle pain redness, blistering, peeling, or loosening of the skin, including inside the mouth severe diarrhea swelling of the  ankles, feet, hands trouble breathing Side effects that usually do not require medical attention (report to yourdoctor or health care provider if they continue or are bothersome): anxious constipation coughing depressed mood eye irritation, itching, or pain fever general ill feeling or flu-like symptoms nausea pain, redness, or irritation at site where injected trouble sleeping This list may not describe all possible side effects. Call your doctor for medical advice about side effects. You may report side effects to FDA at1-800-FDA-1088. Where should I keep my medication? This drug is given in a hospital or clinic. It will not be stored at home. NOTE: This sheet is a summary. It may not cover all possible information. If you have questions about this medicine, talk to your doctor, pharmacist, orhealth care provider.  2022 Elsevier/Gold Standard (2019-07-07 09:13:00)

## 2021-03-26 NOTE — Progress Notes (Signed)
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  INTERVAL HISTORY: Patient returns to clinic today for further evaluation and continuation of Zometa.  She currently feels well and is asymptomatic.  Her right arm lymphedema is significantly improved since treatment and evaluation in the lymphedema clinic.  She is tolerating letrozole and Zometa well without significant side effects. She has no neurologic complaints. She denies any recent fevers or illnesses.  She has a fair appetite, but denies weight loss.  She has no chest pain, shortness of breath, cough, or hemoptysis.  She denies any nausea, vomiting, constipation, or diarrhea.  She has no urinary complaints.  [name]    REVIEW OF SYSTEMS:   Review of Systems  Constitutional: Negative.  Negative for fever, malaise/fatigue and weight loss.  Respiratory: Negative.  Negative for cough, hemoptysis and shortness of breath.   Cardiovascular: Negative.  Negative for chest pain and leg swelling.  Gastrointestinal: Negative.  Negative for abdominal pain.  Genitourinary: Negative.  Negative for dysuria.  Musculoskeletal: Negative.  Negative for back pain and joint pain.  Skin: Negative.  Negative for rash.  Neurological: Negative.  Negative for dizziness, focal weakness, weakness and headaches.  Psychiatric/Behavioral: Negative.  The patient is not nervous/anxious.    As per HPI. Otherwise, a complete review of systems is negative.  PAST MEDICAL HISTORY: Past Medical History:  Diagnosis Date   Anxiety    Arthritis    Diet-controlled diabetes mellitus ([facility])     borderline   Fatigue    HLD (hyperlipidemia)    Invasive carcinoma of breast ([facility]) 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; ER/PR (+), HER2/neu (-)   Metastasis to bone ([facility])    Breast primary   Murmur, cardiac    Peripheral edema    T wave inversion on electrocardiogram 05/17/2020   leads I, II, III, aVF, V3-V6    PAST SURGICAL HISTORY: Past Surgical History:  Procedure Laterality Date   BREAST BIOPSY Right 05/02/2020   [place] Bx, Q clip, path pending   BREAST BIOPSY Right 05/02/2020   [place] Axilla Bx, path pending   CHOLECYSTECTOMY     COLONOSCOPY     LAPAROSCOPIC ABDOMINAL EXPLORATION     MASTECTOMY MODIFIED RADICAL Right 05/23/2020   Procedure: MASTECTOMY MODIFIED RADICAL, WITH EXCISION OF AXILLARY CONTENTS;  Surgeon: Robert Bellow, MD;  Location: [facility] ORS;  Service: General;  Laterality: Right;   PORTACATH PLACEMENT Left 05/23/2020   Procedure: INSERTION PORT-A-CATH;  Surgeon: Robert Bellow, MD;  Location: [facility] ORS;  Service: General;  Laterality: Left;    FAMILY HISTORY: Family History  Problem Relation Age of Onset   Breast cancer Sister     ADVANCED DIRECTIVES (Y/N):  N  HEALTH MAINTENANCE: Social History   Tobacco Use   Smoking status: Never   Smokeless tobacco: Never  Vaping Use   Vaping Use: Never used  Substance Use Topics   Alcohol use: Never   Drug use: Never     Colonoscopy:  PAP:  Bone density:  Lipid panel:  Allergies  Allergen Reactions   Nickel     Current Outpatient Medications  Medication Sig Dispense Refill   Calcium Carbonate-Vitamin D 600-400 MG-UNIT  tablet Take by mouth.     letrozole (FEMARA) 2.5 MG tablet Take 1 tablet (2.5 mg total) by mouth daily. 90 tablet 3   lidocaine-prilocaine (EMLA) cream Apply 1 application topically See admin instructions. APPLY A SMALL AMOUNT TO PORT SITE AT LEAST 1 HOUR PRIOR TO IT BEING ACCESSED THEN COVER WITH PLASTIC WRAP 30 g 3   lovastatin (MEVACOR) 40 MG tablet Take 40 mg by mouth daily.     metFORMIN  (GLUCOPHAGE) 500 MG tablet Take 500 mg by mouth 2 (two) times daily with a meal.     metoprolol succinate (TOPROL-XL) 25 MG 24 hr tablet Take 25 mg by mouth daily.     No current facility-administered medications for this visit.   Facility-Administered Medications Ordered in Other Visits  Medication Dose Route Frequency Provider Last Rate Last Admin   heparin lock flush 100 unit/mL  250 Units Intracatheter Once PRN Lloyd Huger, MD       sodium chloride flush (NS) 0.9 % injection 10 mL  10 mL Intravenous PRN Lloyd Huger, MD   10 mL at 03/26/21 0955    OBJECTIVE: Vitals:   03/26/21 1014  BP: 130/69  Pulse: 78  Temp: (!) 97.5 F (36.4 C)  SpO2: 97%     Body mass index is 31.7 kg/m.    ECOG FS:0 - Asymptomatic  General: Well-developed, well-nourished, no acute distress. Eyes: Pink conjunctiva, anicteric sclera. HEENT: Normocephalic, moist mucous membranes. Lungs: No audible wheezing or coughing. Heart: Regular rate and rhythm. Abdomen: Soft, nontender, no obvious distention. Musculoskeletal: No edema, cyanosis, or clubbing. Neuro: Alert, answering all questions appropriately. Cranial nerves grossly intact. Skin: No rashes or petechiae noted. Psych: Normal affect.   LAB RESULTS:  Lab Results  Component Value Date   NA 138 03/26/2021   K 3.8 03/26/2021   CL 103 03/26/2021   CO2 27 03/26/2021   GLUCOSE 107 (H) 03/26/2021   BUN 15 03/26/2021   CREATININE 0.87 03/26/2021   CALCIUM 8.7 (L) 03/26/2021   GFRNONAA >60 03/26/2021   GFRAA >60 06/18/2020    No results found for: WBC, NEUTROABS, HGB, HCT, MCV, PLT   STUDIES: No results found.   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  PLAN:    1. 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  Patient underwent palliative mastectomy with bilateral lymph node sampling on May 23, 2020.  Patient's patient's CA 27-29 continues to fluctuate tween 51.0 and 68.0.  Her most recent result was 60.8.  Continue letrozole indefinitely.  We previously discussed the option of adding in chemotherapy with oral Ibrance or possibly IV treatment, but patient is not interested at this time.  Patient initiated monthly Zometa on June 18, 2020.  Continue this for 1 year and then transition her to treatment every 3 months.  Proceed with Zometa today.  Return to clinic in 4 weeks for laboratory work and treatment only.  Patient then return to clinic in 8 weeks for laboratory work, further evaluation, and continuation of Zometa.   2.  Hypocalcemia: Mild.  Proceed with treatment as above. 3.  Anxiety: Chronic and unchanged. Continue Xanax as needed. 4.  Genetic testing: Patient has declined. 5.  Right arm lymphedema: No evidence of DVT.  Continue sleeve and wrap as per lymphedema clinic instructions.   Patient expressed understanding and was in agreement with this plan. She also understands that She can call clinic at any time with any questions, concerns, or complaints.   Cancer Staging Breast cancer, right [facility]) Staging form: Breast, AJCC 8th Edition - Clinical: Stage IV (cT4b, cN2, cM1, ER+, PR+, HER2-) - Signed by Lloyd Huger, MD on 05/17/2020 Stage prefix: Initial diagnosis   Lloyd Huger, MD   03/26/2021 4:07 PM

## 2021-03-27 ENCOUNTER — Encounter: Payer: Medicare PPO | Admitting: Occupational Therapy

## 2021-03-27 LAB — CANCER ANTIGEN 27.29: CA 27.29: 57.8 U/mL — ABNORMAL HIGH (ref 0.0–38.6)

## 2021-03-29 ENCOUNTER — Ambulatory Visit: Payer: Medicare PPO | Admitting: Occupational Therapy

## 2021-03-29 ENCOUNTER — Other Ambulatory Visit: Payer: Self-pay

## 2021-03-29 DIAGNOSIS — I972 Postmastectomy lymphedema syndrome: Secondary | ICD-10-CM

## 2021-03-29 NOTE — Therapy (Signed)
[place] MAIN [facility] SERVICES [address] [place], [place], [address] Phone: [phone]   Fax:  [phone]  Occupational Therapy Treatment  Patient Details  Name: Kristy York MRN: [id number] Date of Birth: Dec 22, 1950 Referring Provider (OT): Delight Hoh, MD   Encounter Date: 03/29/2021   OT End of Session - 03/29/21 1115     Visit Number 17    Number of Visits 36    Date for OT Re-Evaluation 04/14/21    Authorization Type 16 visits initially authorized    OT Start Time 1105    OT Stop Time 1205    OT Time Calculation (min) 60 min    Equipment Utilized During Treatment multiple examples of convoluted foam HOS devices to facilitate improved lymphatic flow during HOS    Activity Tolerance Patient tolerated treatment well;No increased pain    Behavior During Therapy WFL for tasks assessed/performed             Past Medical History:  Diagnosis Date   Anxiety    Arthritis    Diet-controlled diabetes mellitus ([place])    borderline   Fatigue    HLD (hyperlipidemia)    Invasive carcinoma of breast ([place]) 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; ER/PR (+), HER2/neu (-)   Metastasis to bone ([facility])    Breast primary   Murmur, cardiac    Peripheral edema    T wave inversion on electrocardiogram 05/17/2020   leads I, II, III, aVF, V3-V6    Past Surgical History:  Procedure Laterality Date   BREAST BIOPSY Right 05/02/2020   [place] Bx, Q clip, path pending   BREAST BIOPSY Right 05/02/2020   [place] Axilla Bx, path pending   CHOLECYSTECTOMY     COLONOSCOPY     LAPAROSCOPIC ABDOMINAL EXPLORATION     MASTECTOMY MODIFIED RADICAL Right 05/23/2020   Procedure: MASTECTOMY MODIFIED RADICAL, WITH EXCISION OF AXILLARY CONTENTS;  Surgeon: Robert Bellow, MD;  Location: [facility] ORS;  Service: General;  Laterality: Right;   PORTACATH PLACEMENT Left 05/23/2020   Procedure: INSERTION PORT-A-CATH;  Surgeon: Robert Bellow, MD;  Location: [facility] ORS;  Service: General;   Laterality: Left;    There were no vitals filed for this visit.   Subjective Assessment - 03/29/21 1116     Subjective  Kristy York presents for OT visit 16`/36 to address RUE/RUQ post-mastectomy lymphedema with gradient compression wraps in place on RUE.  Pt denies LE related pain in the affected RUE/ RUQ. Pt denies any new concerns. She brings clean wraps to clinic.    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 anxiety, arthritis, diet controlled diabetes mellitus, fatique, , heart murmer, periferal edema in feet and legs,    Limitations decreased RUE shoulder AROM, chronic RUE swelling and associated pain, fatigue    Repetition Increases Symptoms    Special Tests _ Stemmer R MPs    Pain Onset 1 to 4 weeks ago   3 weeks ago                         OT Treatments/Exercises (OP) - 03/29/21 1209       ADLs   ADL Education Given Yes      Manual Therapy   Manual  Therapy Edema management;Compression Bandaging    Manual Lymphatic Drainage (MLD) MLD to RUE/RUQ with emphasis on upper arm today.Next visit will focus on trunk and chest wall directing lymphatic congestion alternatively to adjacent L axilla and R inguinal anastamosis.    Compression Bandaging multi layer compression wraps using gradient technique.  Applied single layer of 0.4 cm thick Rosidal foam over cotton stockinett, then short stretch wraps ( 10 cm ) in gradient conmfiguration to axilla.                    OT Education - 03/29/21 1210     Education Details Continued Pt/ CG edu for lymphedema self care  and home program throughout session. Topics include multilayer, gradient compression wrapping, simple self-MLD, therapeutic lymphatic pumping exercises,  skin/nail care, risk reduction factors and LE precautions, compression garments/recommendations and wear and care schedule and compression garment donning / doffing using assistive devices. All questions answered to the Pt's satisfaction, and Pt demonstrates understanding by report.    Person(s) Educated Child(ren);Patient   Kristy York   Methods Explanation;Demonstration;Handout    Comprehension Verbalized understanding;Need further instruction                 OT Long Term Goals - 02/22/21 1056       OT LONG TERM GOAL #1   Title Given this patient's risk-adjustment variables, her Intake Functional Status score of 61/100 on the FOTO tool, patient will experience at least an increase in function of 11 points for a score of 72/100.    Baseline 61/100    Time 12    Period Weeks    Status On-going    Target Date 04/14/21      OT LONG TERM GOAL #2   Title Pt will be able to apply multi-layer, short stretch compression wraps using correct gradient techniques with max caregiver assistance to return affected R limb to premorbid size and shape, to limit pain and infection risk, and to improve functional arm and hand use for ADLs.    Baseline dependent    Time 4    Period Days    Status Achieved      OT LONG TERM GOAL #3   Title With Max  CG Assist during visit intervals Pt  will achieve at least a 10%  limb volume reduction  in the R arm and hand during Intensive Phase CDT to achieve optimal limb volume reduction, to prevent re-accumulation of lymphatic congestion and progression of fibrosis, to limit infection risk, to improve functional arm/hand use, to improve functional performance of basic and instrumental ADLs, and to limit LE progression.    Baseline dependent    Time 12    Period Weeks    Status Achieved   02/22/21 Acieved with 25.3% limb volume reduction since 01/18/21. LVD now WNL at 3.06%, R (dominant/ Rx) > L     OT LONG TERM GOAL #4   Title With Max daily CG assistance Pt will  achieve and sustain a least 85% compliance performing all daily LE self-care home program components throughout Intensive Phase CDT, including recommended skin care regime, lymphatic pumping ther ex, 23/7 compression wraps and simple self-MLD, to ensure optimal limb volume reduction, to limit infection risk and to limit LE progression.    Baseline dependent    Time 12    Period Weeks    Status Achieved      OT LONG TERM GOAL #5   Title By DC from OT Pt will be  able to don and doff appropriate daytime compression garments and HOS devices with Mod CG assistance to limit lymphatic re-accumulation and LE progression with before transitioning to self-management phase of CDT.    Baseline Max A    Time 12    Period Weeks    Status On-going    Target Date 04/14/21      OT LONG TERM GOAL #6   Title Pt will regain full RUE AROM  for flexion and abduction essential for optimal functional performance of basic and instrumental ADLs, productive activities,  and leisure pursuits by DC from OT.    Baseline Max A    Time 12    Period Weeks    Status On-going    Target Date 04/14/21                   Plan - 03/29/21 1115     Clinical Impression Statement Pt tolerated MLD, skin care and multilayer compression wrapping as established. Limb volume continues to decrease and Pt's son continues to diligently assist her between visits. Cont as per POC.    OT Occupational Profile and History Comprehensive Assessment- Review of records and extensive additional review of physical, cognitive, psychosocial history related to current functional performance    Occupational performance deficits (Please refer to evaluation for details): ADL's;IADL's;Leisure;Work;Social Participation;Rest and Sleep    Body Structure / Function / Physical Skills ADL;Decreased knowledge of use of DME;Pain;Edema;UE functional use;IADL;ROM;Fascial restriction;Scar mobility;Skin integrity;Decreased knowledge of precautions    Rehab  Potential Good    Clinical Decision Making Several treatment options, min-mod task modification necessary    Comorbidities Affecting Occupational Performance: Presence of comorbidities impacting occupational performance    Modification or Assistance to Complete Evaluation  Min-Moderate modification of tasks or assist with assess necessary to complete eval    OT Frequency 2x / week    OT Duration 12 weeks   and PRN for follow along support and garment replacement   OT Treatment/Interventions Self-care/ADL training;DME and/or AE instruction;Manual lymph drainage;Therapeutic activities;Compression bandaging;Therapeutic exercise;Scar mobilization;Coping strategies training;Manual Therapy;Patient/family education    Plan Complete Decongestive Therapy to RUE/RUQ to include manual lymphatic drainage a(MLD), skin care with low ph lotion/ castor oil , therapeutic lymphatic pumping exercise, compression wraps graduating to compression garments for daytime and device for HOS PRN. Will also incorporate kinesiotape, myofascial release PRN, scar massage PRN    Recommended Other Services fit with comfortable and effective gradient comptression garments. It;s likely Pt will need custom sleeve and glove, ccl 2 flat knit    Consulted and Agree with Plan of Care Patient             Patient will benefit from skilled therapeutic intervention in order to improve the following deficits and impairments:   Body Structure / Function / Physical Skills: ADL, Decreased knowledge of use of DME, Pain, Edema, UE functional use, IADL, ROM, Fascial restriction, Scar mobility, Skin integrity, Decreased knowledge of precautions       Visit Diagnosis: Postmastectomy lymphedema syndrome    Problem List Patient Active Problem List   Diagnosis Date Noted   Goals of care, counseling/discussion 05/17/2020   Breast cancer, right ([place]) 05/08/2020   Andrey Spearman, MS, OTR/L, [facility] 03/29/21 12:11 PM    [place] [address] [place], [place], [address] Phone: [phone]   Fax:  [phone]  Name: Kristy York MRN: [id number] Date of Birth: Sep 11, 1951

## 2021-04-01 ENCOUNTER — Ambulatory Visit: Payer: Medicare PPO | Admitting: Occupational Therapy

## 2021-04-01 ENCOUNTER — Other Ambulatory Visit: Payer: Self-pay

## 2021-04-01 DIAGNOSIS — I972 Postmastectomy lymphedema syndrome: Secondary | ICD-10-CM

## 2021-04-01 NOTE — Therapy (Signed)
[place] MAIN [facility] SERVICES [address] [place], [place], [address] Phone: [phone]   Fax:  [phone]  Occupational Therapy Treatment  Patient Details  Name: Kristy York MRN: [id number] Date of Birth: 04-16-1951 Referring Provider (OT): Delight Hoh, MD   Encounter Date: 04/01/2021   OT End of Session - 04/01/21 1420     Visit Number 18    Number of Visits 36    Date for OT Re-Evaluation 04/14/21    Authorization Type 16 visits initially authorized    OT Start Time 0208    OT Stop Time 0313    OT Time Calculation (min) 65 min    Equipment Utilized During Treatment multiple examples of convoluted foam HOS devices to facilitate improved lymphatic flow during HOS    Activity Tolerance Patient tolerated treatment well;No increased pain    Behavior During Therapy WFL for tasks assessed/performed             Past Medical History:  Diagnosis Date   Anxiety    Arthritis    Diet-controlled diabetes mellitus ([place])    borderline   Fatigue    HLD (hyperlipidemia)    Invasive carcinoma of breast ([place]) 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; ER/PR (+), HER2/neu (-)   Metastasis to bone ([facility])    Breast primary   Murmur, cardiac    Peripheral edema    T wave inversion on electrocardiogram 05/17/2020   leads I, II, III, aVF, V3-V6    Past Surgical History:  Procedure Laterality Date   BREAST BIOPSY Right 05/02/2020   [place] Bx, Q clip, path pending   BREAST BIOPSY Right 05/02/2020   [place] Axilla Bx, path pending   CHOLECYSTECTOMY     COLONOSCOPY     LAPAROSCOPIC ABDOMINAL EXPLORATION     MASTECTOMY MODIFIED RADICAL Right 05/23/2020   Procedure: MASTECTOMY MODIFIED RADICAL, WITH EXCISION OF AXILLARY CONTENTS;  Surgeon: Robert Bellow, MD;  Location: [facility] ORS;  Service: General;  Laterality: Right;   PORTACATH PLACEMENT Left 05/23/2020   Procedure: INSERTION PORT-A-CATH;  Surgeon: Robert Bellow, MD;  Location: [facility] ORS;  Service: General;   Laterality: Left;    There were no vitals filed for this visit.   Subjective Assessment - 04/01/21 1421     Subjective  Kristy York presents for OT visit 18/36 to address RUE/RUQ post-mastectomy lymphedema with gradient compression wraps in place on RUE.  Pt denies LE related pain in the affected RUE/ RUQ. Pt denies any new concerns. She brings clean wraps to clinic. OT checked status of custom sleeve and glove with DME vendor and vendor is looking into tracking as she believes sleeve may be lost in mail.    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 anxiety, arthritis, diet controlled diabetes mellitus, fatique, , heart murmer, periferal edema in feet and legs,    Limitations decreased RUE shoulder AROM, chronic RUE swelling and associated pain, fatigue    Repetition Increases Symptoms    Special Tests _ Stemmer R MPs    Pain Onset 1 to 4 weeks ago   3 weeks ago                         OT Treatments/Exercises (OP) -  04/01/21 1423       Manual Therapy   Manual Therapy Edema management;Compression Bandaging    Manual Lymphatic Drainage (MLD) MLD to RUE/RUQ with emphasis on upper arm today.Next visit will focus on trunk and chest wall directing lymphatic congestion alternatively to adjacent L axilla and R inguinal anastamosis.    Compression Bandaging multi layer compression wraps using gradient technique.  Applied single layer of 0.4 cm thick Rosidal foam over cotton stockinett, then short stretch wraps ( 6 cm and 10 cm ) in gradient conmfiguration to axilla.Reduced wrap by 1 bandage today.                    OT Education - 04/01/21 1621     Education Details Continued Pt/ CG edu for lymphedema self care  and home  program throughout session. Topics include multilayer, gradient compression wrapping, simple self-MLD, therapeutic lymphatic pumping exercises, skin/nail care, risk reduction factors and LE precautions, compression garments/recommendations and wear and care schedule and compression garment donning / doffing using assistive devices. All questions answered to the Pt's satisfaction, and Pt demonstrates understanding by report.    Person(s) Educated Child(ren);Patient   Kristy York   Methods Explanation;Demonstration;Handout    Comprehension Verbalized understanding;Need further instruction                 OT Long Term Goals - 02/22/21 1056       OT LONG TERM GOAL #1   Title Given this patient's risk-adjustment variables, her Intake Functional Status score of 61/100 on the FOTO tool, patient will experience at least an increase in function of 11 points for a score of 72/100.    Baseline 61/100    Time 12    Period Weeks    Status On-going    Target Date 04/14/21      OT LONG TERM GOAL #2   Title Pt will be able to apply multi-layer, short stretch compression wraps using correct gradient techniques with max caregiver assistance to return affected R limb to premorbid size and shape, to limit pain and infection risk, and to improve functional arm and hand use for ADLs.    Baseline dependent    Time 4    Period Days    Status Achieved      OT LONG TERM GOAL #3   Title With Max  CG Assist during visit intervals Pt  will achieve at least a 10%  limb volume reduction  in the R arm and hand during Intensive Phase CDT to achieve optimal limb volume reduction, to prevent re-accumulation of lymphatic congestion and progression of fibrosis, to limit infection risk, to improve functional arm/hand use, to improve functional performance of basic and instrumental ADLs, and to limit LE progression.    Baseline dependent    Time 12    Period Weeks    Status Achieved   02/22/21 Acieved with 25.3% limb  volume reduction since 01/18/21. LVD now WNL at 3.06%, R (dominant/ Rx) > L     OT LONG TERM GOAL #4   Title With Max daily CG assistance Pt will achieve and sustain a least 85% compliance performing all daily LE self-care home program components throughout Intensive Phase CDT, including recommended skin care regime, lymphatic pumping ther ex, 23/7 compression wraps and simple self-MLD, to ensure optimal limb volume reduction, to limit infection risk and to limit LE progression.    Baseline dependent    Time 12    Period Weeks    Status  Achieved      OT LONG TERM GOAL #5   Title By DC from OT Pt will be able to don and doff appropriate daytime compression garments and HOS devices with Mod CG assistance to limit lymphatic re-accumulation and LE progression with before transitioning to self-management phase of CDT.    Baseline Max A    Time 12    Period Weeks    Status On-going    Target Date 04/14/21      OT LONG TERM GOAL #6   Title Pt will regain full RUE AROM  for flexion and abduction essential for optimal functional performance of basic and instrumental ADLs, productive activities,  and leisure pursuits by DC from OT.    Baseline Max A    Time 12    Period Weeks    Status On-going    Target Date 04/14/21                   Plan - 04/01/21 1619     Clinical Impression Statement Pt tolerated MLD, skin care and multilayer compression wrapping as established. Lymphatic congestion has nearly resolved in arm except at medical  volar forearm where dense protien rich edema remains palpable. reduced wraps from 4 to 3 layers on trial basis to be sure not to over compress limb. Fit custom compression garments ASAP. Marland Kitchen Cont as per POC.    OT Occupational Profile and History Comprehensive Assessment- Review of records and extensive additional review of physical, cognitive, psychosocial history related to current functional performance    Occupational performance deficits (Please refer to  evaluation for details): ADL's;IADL's;Leisure;Work;Social Participation;Rest and Sleep    Body Structure / Function / Physical Skills ADL;Decreased knowledge of use of DME;Pain;Edema;UE functional use;IADL;ROM;Fascial restriction;Scar mobility;Skin integrity;Decreased knowledge of precautions    Rehab Potential Good    Clinical Decision Making Several treatment options, min-mod task modification necessary    Comorbidities Affecting Occupational Performance: Presence of comorbidities impacting occupational performance    Modification or Assistance to Complete Evaluation  Min-Moderate modification of tasks or assist with assess necessary to complete eval    OT Frequency 2x / week    OT Duration 12 weeks   and PRN for follow along support and garment replacement   OT Treatment/Interventions Self-care/ADL training;DME and/or AE instruction;Manual lymph drainage;Therapeutic activities;Compression bandaging;Therapeutic exercise;Scar mobilization;Coping strategies training;Manual Therapy;Patient/family education    Plan Complete Decongestive Therapy to RUE/RUQ to include manual lymphatic drainage a(MLD), skin care with low ph lotion/ castor oil , therapeutic lymphatic pumping exercise, compression wraps graduating to compression garments for daytime and device for HOS PRN. Will also incorporate kinesiotape, myofascial release PRN, scar massage PRN    Recommended Other Services fit with comfortable and effective gradient comptression garments. It;s likely Pt will need custom sleeve and glove, ccl 2 flat knit    Consulted and Agree with Plan of Care Patient             Patient will benefit from skilled therapeutic intervention in order to improve the following deficits and impairments:   Body Structure / Function / Physical Skills: ADL, Decreased knowledge of use of DME, Pain, Edema, UE functional use, IADL, ROM, Fascial restriction, Scar mobility, Skin integrity, Decreased knowledge of precautions        Visit Diagnosis: Postmastectomy lymphedema syndrome    Problem List Patient Active Problem List   Diagnosis Date Noted   Goals of care, counseling/discussion 05/17/2020   Breast cancer, right ([place]) 05/08/2020    Kristy Spearman,  MS, OTR/L, CLT-[facility] 04/01/21 4:22 PM    [place] MAIN [facility] SERVICES [address] [place], [place], [address] Phone: [phone]   Fax:  [phone]  Name: Kristy York MRN: [id number] Date of Birth: [birth year]

## 2021-04-02 ENCOUNTER — Encounter: Payer: Medicare PPO | Admitting: Occupational Therapy

## 2021-04-03 ENCOUNTER — Ambulatory Visit: Payer: Medicare PPO | Admitting: Occupational Therapy

## 2021-04-04 ENCOUNTER — Ambulatory Visit: Payer: Medicare PPO | Admitting: Occupational Therapy

## 2021-04-04 ENCOUNTER — Other Ambulatory Visit: Payer: Self-pay

## 2021-04-04 DIAGNOSIS — I972 Postmastectomy lymphedema syndrome: Secondary | ICD-10-CM

## 2021-04-04 NOTE — Therapy (Signed)
[place] MAIN [facility] SERVICES [address] [place], [place], [address] Phone: [phone]   Fax:  [phone]  Occupational Therapy Treatment  Patient Details  Name: Kristy York MRN: [id number] Date of Birth: 1950-10-27 Referring Provider (OT): Delight Hoh, MD   Encounter Date: 04/04/2021   OT End of Session - 04/04/21 0816     Visit Number 19    Number of Visits 36    Date for OT Re-Evaluation 04/14/21    Authorization Type 16 visits initially authorized    OT Start Time 0803    OT Stop Time 0903    OT Time Calculation (min) 60 min    Equipment Utilized During Treatment multiple examples of convoluted foam HOS devices to facilitate improved lymphatic flow during HOS    Activity Tolerance Patient tolerated treatment well;No increased pain    Behavior During Therapy WFL for tasks assessed/performed             Past Medical History:  Diagnosis Date   Anxiety    Arthritis    Diet-controlled diabetes mellitus ([place])    borderline   Fatigue    HLD (hyperlipidemia)    Invasive carcinoma of breast ([place]) 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; ER/PR (+), HER2/neu (-)   Metastasis to bone ([facility])    Breast primary   Murmur, cardiac    Peripheral edema    T wave inversion on electrocardiogram 05/17/2020   leads I, II, III, aVF, V3-V6    Past Surgical History:  Procedure Laterality Date   BREAST BIOPSY Right 05/02/2020   [place] Bx, Q clip, path pending   BREAST BIOPSY Right 05/02/2020   [place] Axilla Bx, path pending   CHOLECYSTECTOMY     COLONOSCOPY     LAPAROSCOPIC ABDOMINAL EXPLORATION     MASTECTOMY MODIFIED RADICAL Right 05/23/2020   Procedure: MASTECTOMY MODIFIED RADICAL, WITH EXCISION OF AXILLARY CONTENTS;  Surgeon: Robert Bellow, MD;  Location: [facility] ORS;  Service: General;  Laterality: Right;   PORTACATH PLACEMENT Left 05/23/2020   Procedure: INSERTION PORT-A-CATH;  Surgeon: Robert Bellow, MD;  Location: [facility] ORS;  Service: General;   Laterality: Left;    There were no vitals filed for this visit.   Subjective Assessment - 04/04/21 0944     Subjective  Kristy York presents for OT visit 19/36 to address RUE/RUQ post-mastectomy lymphedema with gradient compression wraps in place on RUE.  Pt denies LE related pain in the affected RUE/ RUQ. Pt denies any new concerns this morning.    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 anxiety, arthritis, diet controlled diabetes mellitus, fatique, , heart murmer, periferal edema in feet and legs,    Limitations decreased RUE shoulder AROM, chronic RUE swelling and associated pain, fatigue    Repetition Increases Symptoms    Special Tests _ Stemmer R MPs    Pain Onset 1 to 4 weeks ago   3 weeks ago                         OT Treatments/Exercises (OP) - 04/04/21 0944       ADLs   ADL Education Given Yes      Manual Therapy   Manual Therapy Edema management;Compression Bandaging  Manual Lymphatic Drainage (MLD) MLD to RUE/RUQ with emphasis on upper arm today.Next visit will focus on trunk and chest wall directing lymphatic congestion alternatively to adjacent L axilla and R inguinal anastamosis.    Compression Bandaging multi layer compression wraps using gradient technique.  Applied single layer of 0.4 cm thick Rosidal foam over cotton stockinett, then short stretch wraps ( 10 cm ) in gradient conmfiguration to axilla.                    OT Education - 04/04/21 0945     Education Details Continued Pt/ CG edu for lymphedema self care  and home program throughout session. Topics include multilayer, gradient compression wrapping, simple self-MLD, therapeutic lymphatic pumping exercises, skin/nail care, risk  reduction factors and LE precautions, compression garments/recommendations and wear and care schedule and compression garment donning / doffing using assistive devices. All questions answered to the Pt's satisfaction, and Pt demonstrates understanding by report.    Person(s) Educated Child(ren);Patient   [name]   Methods Explanation;Demonstration;Handout    Comprehension Verbalized understanding;Need further instruction                 OT Long Term Goals - 02/22/21 1056       OT LONG TERM GOAL #1   Title Given this patient's risk-adjustment variables, her Intake Functional Status score of 61/100 on the FOTO tool, patient will experience at least an increase in function of 11 points for a score of 72/100.    Baseline 61/100    Time 12    Period Weeks    Status On-going    Target Date 04/14/21      OT LONG TERM GOAL #2   Title Pt will be able to apply multi-layer, short stretch compression wraps using correct gradient techniques with max caregiver assistance to return affected R limb to premorbid size and shape, to limit pain and infection risk, and to improve functional arm and hand use for ADLs.    Baseline dependent    Time 4    Period Days    Status Achieved      OT LONG TERM GOAL #3   Title With Max  CG Assist during visit intervals Pt  will achieve at least a 10%  limb volume reduction  in the R arm and hand during Intensive Phase CDT to achieve optimal limb volume reduction, to prevent re-accumulation of lymphatic congestion and progression of fibrosis, to limit infection risk, to improve functional arm/hand use, to improve functional performance of basic and instrumental ADLs, and to limit LE progression.    Baseline dependent    Time 12    Period Weeks    Status Achieved   02/22/21 Acieved with 25.3% limb volume reduction since 01/18/21. LVD now WNL at 3.06%, R (dominant/ Rx) > L     OT LONG TERM GOAL #4   Title With Max daily CG assistance Pt will achieve and sustain a  least 85% compliance performing all daily LE self-care home program components throughout Intensive Phase CDT, including recommended skin care regime, lymphatic pumping ther ex, 23/7 compression wraps and simple self-MLD, to ensure optimal limb volume reduction, to limit infection risk and to limit LE progression.    Baseline dependent    Time 12    Period Weeks    Status Achieved      OT LONG TERM GOAL #5   Title By DC from OT Pt will be able to don and doff appropriate daytime  compression garments and HOS devices with Mod CG assistance to limit lymphatic re-accumulation and LE progression with before transitioning to self-management phase of CDT.    Baseline Max A    Time 12    Period Weeks    Status On-going    Target Date 04/14/21      OT LONG TERM GOAL #6   Title Pt will regain full RUE AROM  for flexion and abduction essential for optimal functional performance of basic and instrumental ADLs, productive activities,  and leisure pursuits by DC from OT.    Baseline Max A    Time 12    Period Weeks    Status On-going    Target Date 04/14/21                   Plan - 04/04/21 0940     Clinical Impression Statement Pt removed wraps and washed R arm with soap and water. Provided MLD in supine to RUE/ RUQ directing edema across anterior axillary anastamosis and toward clavical. Pt still awaiting custom compression garments as limb is well decongested. She is a patient and tolerant lady as this protocol is lengthy for her. Cont as per POC. Fit garments ASAP.    OT Occupational Profile and History Comprehensive Assessment- Review of records and extensive additional review of physical, cognitive, psychosocial history related to current functional performance    Occupational performance deficits (Please refer to evaluation for details): ADL's;IADL's;Leisure;Work;Social Participation;Rest and Sleep    Body Structure / Function / Physical Skills ADL;Decreased knowledge of use of  DME;Pain;Edema;UE functional use;IADL;ROM;Fascial restriction;Scar mobility;Skin integrity;Decreased knowledge of precautions    Rehab Potential Good    Clinical Decision Making Several treatment options, min-mod task modification necessary    Comorbidities Affecting Occupational Performance: Presence of comorbidities impacting occupational performance    Modification or Assistance to Complete Evaluation  Min-Moderate modification of tasks or assist with assess necessary to complete eval    OT Frequency 2x / week    OT Duration 12 weeks   and PRN for follow along support and garment replacement   OT Treatment/Interventions Self-care/ADL training;DME and/or AE instruction;Manual lymph drainage;Therapeutic activities;Compression bandaging;Therapeutic exercise;Scar mobilization;Coping strategies training;Manual Therapy;Patient/family education    Plan Complete Decongestive Therapy to RUE/RUQ to include manual lymphatic drainage a(MLD), skin care with low ph lotion/ castor oil , therapeutic lymphatic pumping exercise, compression wraps graduating to compression garments for daytime and device for HOS PRN. Will also incorporate kinesiotape, myofascial release PRN, scar massage PRN    Recommended Other Services fit with comfortable and effective gradient comptression garments. It;s likely Pt will need custom sleeve and glove, ccl 2 flat knit    Consulted and Agree with Plan of Care Patient             Patient will benefit from skilled therapeutic intervention in order to improve the following deficits and impairments:   Body Structure / Function / Physical Skills: ADL, Decreased knowledge of use of DME, Pain, Edema, UE functional use, IADL, ROM, Fascial restriction, Scar mobility, Skin integrity, Decreased knowledge of precautions       Visit Diagnosis: Postmastectomy lymphedema syndrome    Problem List Patient Active Problem List   Diagnosis Date Noted   Goals of care,  counseling/discussion 05/17/2020   Breast cancer, right ([place]) 05/08/2020   Andrey Spearman, MS, OTR/L, [facility] 04/04/21 9:46 AM   [place] [address] [place], [place], [address] Phone: [phone]   Fax:  [phone]  Name: Kolbie Clarkston MRN: [id number] Date of Birth: November 09, 1950

## 2021-04-09 ENCOUNTER — Ambulatory Visit: Payer: Medicare PPO | Attending: Oncology | Admitting: Occupational Therapy

## 2021-04-09 DIAGNOSIS — I972 Postmastectomy lymphedema syndrome: Secondary | ICD-10-CM | POA: Insufficient documentation

## 2021-04-11 ENCOUNTER — Ambulatory Visit: Payer: Medicare PPO | Admitting: Occupational Therapy

## 2021-04-11 ENCOUNTER — Other Ambulatory Visit: Payer: Self-pay

## 2021-04-11 DIAGNOSIS — I972 Postmastectomy lymphedema syndrome: Secondary | ICD-10-CM

## 2021-04-11 NOTE — Therapy (Signed)
[place] MAIN [facility] SERVICES [address] [place], [place], [address] Phone: [phone]   Fax:  [phone]  Occupational Therapy Treatment  Patient Details  Name: Kristy York MRN: [id number] Date of Birth: [birth year] Referring Provider (OT): Delight Hoh, MD   Encounter Date: 04/11/2021   OT End of Session - 04/11/21 0902     Visit Number 20    Number of Visits 36    Date for OT Re-Evaluation 04/14/21    Authorization Type 16 visits initially authorized    OT Start Time 0850    OT Stop Time 1000    OT Time Calculation (min) 70 min    Equipment Utilized During Treatment multiple examples of convoluted foam HOS devices to facilitate improved lymphatic flow during HOS    Activity Tolerance Patient tolerated treatment well;No increased pain    Behavior During Therapy WFL for tasks assessed/performed             Past Medical History:  Diagnosis Date   Anxiety    Arthritis    Diet-controlled diabetes mellitus ([place])    borderline   Fatigue    HLD (hyperlipidemia)    Invasive carcinoma of breast ([place]) 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; ER/PR (+), HER2/neu (-)   Metastasis to bone ([facility])    Breast primary   Murmur, cardiac    Peripheral edema    T wave inversion on electrocardiogram 05/17/2020   leads I, II, III, aVF, V3-V6    Past Surgical History:  Procedure Laterality Date   BREAST BIOPSY Right 05/02/2020   [place] Bx, Q clip, path pending   BREAST BIOPSY Right 05/02/2020   [place] Axilla Bx, path pending   CHOLECYSTECTOMY     COLONOSCOPY     LAPAROSCOPIC ABDOMINAL EXPLORATION     MASTECTOMY MODIFIED RADICAL Right 05/23/2020   Procedure: MASTECTOMY MODIFIED RADICAL, WITH EXCISION OF AXILLARY CONTENTS;  Surgeon: Robert Bellow, MD;  Location: [facility] ORS;  Service: General;  Laterality: Right;   PORTACATH PLACEMENT Left 05/23/2020   Procedure: INSERTION PORT-A-CATH;  Surgeon: Robert Bellow, MD;  Location: [facility] ORS;  Service: General;   Laterality: Left;    There were no vitals filed for this visit.   Subjective Assessment - 04/11/21 1455     Subjective  Kristy York presents for OT visit 20/36 to address RUE/RUQ post-mastectomy lymphedema with gradient compression wraps in place on RUE.  Pt denies LE related pain in the affected RUE/ RUQ. Pt denies any new concerns this morning.    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 anxiety, arthritis, diet controlled diabetes mellitus, fatique, , heart murmer, periferal edema in feet and legs,    Limitations decreased RUE shoulder AROM, chronic RUE swelling and associated pain, fatigue    Repetition Increases Symptoms    Special Tests _ Stemmer R MPs    Pain Onset 1 to 4 weeks ago   3 weeks ago                         OT Treatments/Exercises (OP) - 04/11/21 1455       ADLs   ADL Education Given Yes      Manual Therapy   Manual Therapy Edema management;Compression Bandaging  Manual Lymphatic Drainage (MLD) MLD to RUE/RUQ with emphasis on upper arm today.Next visit will focus on trunk and chest wall directing lymphatic congestion alternatively to adjacent L axilla and R inguinal anastamosis.    Compression Bandaging multi layer compression wraps using gradient technique.  Applied single layer of 0.4 cm thick Rosidal foam over cotton stockinett, then short stretch wraps ( 10 cm ) in gradient conmfiguration to axilla.                    OT Education - 04/11/21 1501     Education Details Continued Pt/ CG edu for lymphedema self care  and home program throughout session. Topics include multilayer, gradient compression wrapping, simple self-MLD, therapeutic lymphatic pumping exercises, skin/nail care, risk  reduction factors and LE precautions, compression garments/recommendations and wear and care schedule and compression garment donning / doffing using assistive devices. All questions answered to the Pt's satisfaction, and Pt demonstrates understanding by report.    Person(s) Educated Child(ren);Patient   Kristy York   Methods Explanation;Demonstration;Handout    Comprehension Verbalized understanding;Need further instruction                 OT Long Term Goals - 04/10/21 1452       OT LONG TERM GOAL #1   Title Given this patient's risk-adjustment variables, her Intake Functional Status score of 61/100 on the FOTO tool, patient will experience at least an increase in function of 11 points for a score of 72/100.    Baseline 61/100    Time 12    Period Weeks    Status On-going    Target Date 07/10/21      OT LONG TERM GOAL #2   Title Pt will be able to apply multi-layer, short stretch compression wraps using correct gradient techniques with max caregiver assistance to return affected R limb to premorbid size and shape, to limit pain and infection risk, and to improve functional arm and hand use for ADLs.    Baseline dependent    Time 4    Period Days    Status Achieved      OT LONG TERM GOAL #3   Title With Max  CG Assist during visit intervals Pt  will achieve at least a 10%  limb volume reduction  in the R arm and hand during Intensive Phase CDT to achieve optimal limb volume reduction, to prevent re-accumulation of lymphatic congestion and progression of fibrosis, to limit infection risk, to improve functional arm/hand use, to improve functional performance of basic and instrumental ADLs, and to limit LE progression.    Baseline dependent    Time 12    Period Weeks    Status Achieved   02/22/21 Acieved with 25.3% limb volume reduction since 01/18/21. LVD now WNL at 3.06%, R (dominant/ Rx) > L     OT LONG TERM GOAL #4   Title With Max daily CG assistance Pt will achieve and sustain a  least 85% compliance performing all daily LE self-care home program components throughout Intensive Phase CDT, including recommended skin care regime, lymphatic pumping ther ex, 23/7 compression wraps and simple self-MLD, to ensure optimal limb volume reduction, to limit infection risk and to limit LE progression.    Baseline dependent    Time 12    Period Weeks    Status Achieved      OT LONG TERM GOAL #5   Title By DC from OT Pt will be able to don and doff appropriate daytime  compression garments and HOS devices with Mod CG assistance to limit lymphatic re-accumulation and LE progression with before transitioning to self-management phase of CDT.    Baseline Max A    Time 12    Period Weeks    Status On-going    Target Date 07/10/21      OT LONG TERM GOAL #6   Title Pt will regain full RUE AROM  for flexion and abduction essential for optimal functional performance of basic and instrumental ADLs, productive activities,  and leisure pursuits by DC from OT.    Baseline Max A    Time 12    Period Weeks    Status Achieved                   Plan - 04/11/21 1435     Clinical Impression Statement Continued RUE/RUQ CDT today to reduce  RUE post-mastectomy lymphedema  and to limit reaccumulation of fluid and tissue fibrosis leading MLD, skin care and gradient cmpression wraps to RLE to reduce limb volume and limit fluid  re accumulation while awaiting delivery of custom compression garments a few weeks ago. Providided MLD, skin care and reapplied compressionw raps as established;. Ptt tolerated all aspects of care without increased pain. RUE is well decongested with mild area of fibrotic tissue palpable between in volar campartment between wrist flexors. Pt continues to make steady progress towards all OT goals for LE care. Please review Long Term Goals section for detaoiled assessments. Cont as per POC. Fit custom sleeve and glove and reduce OT frequency to follow along frequency.    OT  Occupational Profile and History Problem Focused Assessment - Including review of records relating to presenting problem    Occupational performance deficits (Please refer to evaluation for details): ADL's;IADL's;Leisure;Work;Social Participation;Rest and Sleep    Body Structure / Function / Physical Skills ADL;Decreased knowledge of use of DME;Pain;Edema;UE functional use;IADL;ROM;Fascial restriction;Scar mobility;Skin integrity;Decreased knowledge of precautions    Rehab Potential Good    Clinical Decision Making Several treatment options, min-mod task modification necessary    Comorbidities Affecting Occupational Performance: Presence of comorbidities impacting occupational performance    Modification or Assistance to Complete Evaluation  Min-Moderate modification of tasks or assist with assess necessary to complete eval    OT Frequency 2x / week    OT Duration 12 weeks   and PRN for follow along support and garment replacement   OT Treatment/Interventions Self-care/ADL training;DME and/or AE instruction;Manual lymph drainage;Therapeutic activities;Compression bandaging;Therapeutic exercise;Scar mobilization;Coping strategies training;Manual Therapy;Patient/family education    Plan Complete Decongestive Therapy to RUE/RUQ to include manual lymphatic drainage a(MLD), skin care with low ph lotion/ castor oil , therapeutic lymphatic pumping exercise, compression wraps graduating to compression garments for daytime and device for HOS PRN. Will also incorporate kinesiotape, myofascial release PRN, scar massage PRN    Recommended Other Services fit with comfortable and effective gradient comptression garments. It;s likely Pt will need custom sleeve and glove, ccl 2 flat knit    Consulted and Agree with Plan of Care Patient             Patient will benefit from skilled therapeutic intervention in order to improve the following deficits and impairments:   Body Structure / Function / Physical Skills:  ADL, Decreased knowledge of use of DME, Pain, Edema, UE functional use, IADL, ROM, Fascial restriction, Scar mobility, Skin integrity, Decreased knowledge of precautions       Visit Diagnosis: Postmastectomy lymphedema syndrome    Problem List Patient  Active Problem List   Diagnosis Date Noted   Goals of care, counseling/discussion 05/17/2020   Breast cancer, right ([place]) 05/08/2020   Andrey Spearman, MS, OTR/L, [facility] 04/11/21 3:04 PM   [place] MAIN [facility] SERVICES [address] [place], [place], [address] Phone: [phone]   Fax:  [phone]  Name: Kristy York MRN: [id number] Date of Birth: [birth year]

## 2021-04-16 ENCOUNTER — Ambulatory Visit: Payer: Medicare PPO | Admitting: Occupational Therapy

## 2021-04-16 ENCOUNTER — Other Ambulatory Visit: Payer: Self-pay

## 2021-04-16 DIAGNOSIS — I972 Postmastectomy lymphedema syndrome: Secondary | ICD-10-CM

## 2021-04-16 NOTE — Therapy (Signed)
[place] MAIN [facility] SERVICES [address] [place], [place], [address] Phone: [phone]   Fax:  [phone]  Occupational Therapy Treatment  Patient Details  Name: Kristy York MRN: [id number] Date of Birth: 1951-03-06 Referring Provider (OT): Delight Hoh, MD   Encounter Date: 04/16/2021   OT End of Session - 04/16/21 1228     Visit Number 21    Number of Visits 36    Date for OT Re-Evaluation 07/15/21    Authorization Type 16 visits initially authorized    OT Start Time 0910    OT Stop Time 1015    OT Time Calculation (min) 65 min    Equipment Utilized During Treatment multiple examples of convoluted foam HOS devices to facilitate improved lymphatic flow during HOS    Activity Tolerance Patient tolerated treatment well;No increased pain    Behavior During Therapy WFL for tasks assessed/performed             Past Medical History:  Diagnosis Date   Anxiety    Arthritis    Diet-controlled diabetes mellitus ([place])    borderline   Fatigue    HLD (hyperlipidemia)    Invasive carcinoma of breast ([place]) 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; ER/PR (+), HER2/neu (-)   Metastasis to bone (HCC)    Breast primary   Murmur, cardiac    Peripheral edema    T wave inversion on electrocardiogram 05/17/2020   leads I, II, III, aVF, V3-V6    Past Surgical History:  Procedure Laterality Date   BREAST BIOPSY Right 05/02/2020   [place] Bx, Q clip, path pending   BREAST BIOPSY Right 05/02/2020   [place] Axilla Bx, path pending   CHOLECYSTECTOMY     COLONOSCOPY     LAPAROSCOPIC ABDOMINAL EXPLORATION     MASTECTOMY MODIFIED RADICAL Right 05/23/2020   Procedure: MASTECTOMY MODIFIED RADICAL, WITH EXCISION OF AXILLARY CONTENTS;  Surgeon: Robert Bellow, MD;  Location: [facility] ORS;  Service: General;  Laterality: Right;   PORTACATH PLACEMENT Left 05/23/2020   Procedure: INSERTION PORT-A-CATH;  Surgeon: Robert Bellow, MD;  Location: [facility] ORS;  Service: General;   Laterality: Left;    There were no vitals filed for this visit.   Subjective Assessment - 04/16/21 1238     Subjective  Kristy York presents for OT visit 21/36 to address RUE/RUQ post-mastectomy lymphedema with gradient compression wraps in place on RUE.  Pt denies LE related pain in the affected RUE/ RUQ. Pt expresses frustration that custom compression garments have still not arrived (from [place]) for fitting.    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 anxiety, arthritis, diet controlled diabetes mellitus, fatique, , heart murmer, periferal edema in feet and legs,    Limitations decreased RUE shoulder AROM, chronic RUE swelling and associated pain, fatigue    Repetition Increases Symptoms    Special Tests _ Stemmer R MPs    Pain Onset 1 to 4 weeks ago   3 weeks ago                         OT Treatments/Exercises (OP) - 04/16/21 1239       ADLs   ADL Education Given Yes      Manual  Therapy   Manual Therapy Edema management;Compression Bandaging    Manual Lymphatic Drainage (MLD) MLD to RUE/RUQ with emphasis on upper arm today.Next visit will focus on trunk and chest wall directing lymphatic congestion alternatively to adjacent L axilla and R inguinal anastamosis.    Compression Bandaging multi layer compression wraps using gradient technique.  Applied single layer of 0.4 cm thick Rosidal foam over cotton stockinett, then short stretch wraps ( 10 cm ) in gradient conmfiguration to axilla.                    OT Education - 04/16/21 1239     Education Details Continued Pt/ CG edu for lymphedema self care  and home program throughout session. Topics include multilayer, gradient compression wrapping, simple  self-MLD, therapeutic lymphatic pumping exercises, skin/nail care, risk reduction factors and LE precautions, compression garments/recommendations and wear and care schedule and compression garment donning / doffing using assistive devices. All questions answered to the Pt's satisfaction, and Pt demonstrates understanding by report.    Person(s) Educated Child(ren);Patient   [name]   Methods Explanation;Demonstration;Handout    Comprehension Verbalized understanding;Need further instruction                 OT Long Term Goals - 04/10/21 1452       OT LONG TERM GOAL #1   Title Given this patient's risk-adjustment variables, her Intake Functional Status score of 61/100 on the FOTO tool, patient will experience at least an increase in function of 11 points for a score of 72/100.    Baseline 61/100    Time 12    Period Weeks    Status On-going    Target Date 07/10/21      OT LONG TERM GOAL #2   Title Pt will be able to apply multi-layer, short stretch compression wraps using correct gradient techniques with max caregiver assistance to return affected R limb to premorbid size and shape, to limit pain and infection risk, and to improve functional arm and hand use for ADLs.    Baseline dependent    Time 4    Period Days    Status Achieved      OT LONG TERM GOAL #3   Title With Max  CG Assist during visit intervals Pt  will achieve at least a 10%  limb volume reduction  in the R arm and hand during Intensive Phase CDT to achieve optimal limb volume reduction, to prevent re-accumulation of lymphatic congestion and progression of fibrosis, to limit infection risk, to improve functional arm/hand use, to improve functional performance of basic and instrumental ADLs, and to limit LE progression.    Baseline dependent    Time 12    Period Weeks    Status Achieved   02/22/21 Acieved with 25.3% limb volume reduction since 01/18/21. LVD now WNL at 3.06%, R (dominant/ Rx) > L     OT LONG TERM GOAL  #4   Title With Max daily CG assistance Pt will achieve and sustain a least 85% compliance performing all daily LE self-care home program components throughout Intensive Phase CDT, including recommended skin care regime, lymphatic pumping ther ex, 23/7 compression wraps and simple self-MLD, to ensure optimal limb volume reduction, to limit infection risk and to limit LE progression.    Baseline dependent    Time 12    Period Weeks    Status Achieved      OT LONG TERM GOAL #5   Title By DC from  OT Pt will be able to don and doff appropriate daytime compression garments and HOS devices with Mod CG assistance to limit lymphatic re-accumulation and LE progression with before transitioning to self-management phase of CDT.    Baseline Max A    Time 12    Period Weeks    Status On-going    Target Date 07/10/21      OT LONG TERM GOAL #6   Title Pt will regain full RUE AROM  for flexion and abduction essential for optimal functional performance of basic and instrumental ADLs, productive activities,  and leisure pursuits by DC from OT.    Baseline Max A    Time 12    Period Weeks    Status Achieved                   Plan - 04/16/21 1231     Clinical Impression Statement Continued RUE/RUQ CDT today as established including MLD, skin care and gradient compression wrapping from hand to axilla. Arm swelling is well controlled with wraps. Mild, non-pitting tissue fibrosis remains palpable at medial volar arm.  Pt expresses frustration that custom compression arm sleeve and glove (ordered by OT on 02/22/21) have not yet arrived for fitting. DME vendor reports sleeve arrived today, but glove has not yet arrived. DME vendor agreed with our request to go ahead and send sleeve so we can complete initial fitting and remeasure should remake be needed. Cont as per POC.    OT Occupational Profile and History Problem Focused Assessment - Including review of records relating to presenting problem     Occupational performance deficits (Please refer to evaluation for details): ADL's;IADL's;Leisure;Work;Social Participation;Rest and Sleep    Body Structure / Function / Physical Skills ADL;Decreased knowledge of use of DME;Pain;Edema;UE functional use;IADL;ROM;Fascial restriction;Scar mobility;Skin integrity;Decreased knowledge of precautions    Rehab Potential Good    Clinical Decision Making Several treatment options, min-mod task modification necessary    Comorbidities Affecting Occupational Performance: Presence of comorbidities impacting occupational performance    Modification or Assistance to Complete Evaluation  Min-Moderate modification of tasks or assist with assess necessary to complete eval    OT Frequency 2x / week    OT Duration 12 weeks   and PRN for follow along support and garment replacement   OT Treatment/Interventions Self-care/ADL training;DME and/or AE instruction;Manual lymph drainage;Therapeutic activities;Compression bandaging;Therapeutic exercise;Scar mobilization;Coping strategies training;Manual Therapy;Patient/family education    Plan Complete Decongestive Therapy to RUE/RUQ to include manual lymphatic drainage a(MLD), skin care with low ph lotion/ castor oil , therapeutic lymphatic pumping exercise, compression wraps graduating to compression garments for daytime and device for HOS PRN. Will also incorporate kinesiotape, myofascial release PRN, scar massage PRN    Recommended Other Services fit with comfortable and effective gradient comptression garments. It;s likely Pt will need custom sleeve and glove, ccl 2 flat knit    Consulted and Agree with Plan of Care Patient             Patient will benefit from skilled therapeutic intervention in order to improve the following deficits and impairments:   Body Structure / Function / Physical Skills: ADL, Decreased knowledge of use of DME, Pain, Edema, UE functional use, IADL, ROM, Fascial restriction, Scar mobility, Skin  integrity, Decreased knowledge of precautions       Visit Diagnosis: Postmastectomy lymphedema syndrome    Problem List Patient Active Problem List   Diagnosis Date Noted   Goals of care, counseling/discussion 05/17/2020   Breast cancer,  right [facility]) 05/08/2020   Andrey Spearman, MS, OTR/L, [facility] 04/16/21 12:42 PM    [place] MAIN [facility] SERVICES [address] [place], [place], [address] Phone: [phone]   Fax:  [phone]  Name: Samanthia Howland MRN: [id number] Date of Birth: October 12, 1950

## 2021-04-18 ENCOUNTER — Other Ambulatory Visit: Payer: Self-pay

## 2021-04-18 ENCOUNTER — Ambulatory Visit: Payer: Medicare PPO | Admitting: Occupational Therapy

## 2021-04-18 DIAGNOSIS — I972 Postmastectomy lymphedema syndrome: Secondary | ICD-10-CM | POA: Diagnosis not present

## 2021-04-18 NOTE — Therapy (Signed)
[place] MAIN [facility] SERVICES [address] [place], [place], [address] Phone: [phone]   Fax:  [phone]  Occupational Therapy Treatment  Patient Details  Name: Kristy York MRN: [id number] Date of Birth: 09-27-1951 Referring Provider (OT): Delight Hoh, MD   Encounter Date: 04/18/2021   OT End of Session - 04/18/21 1623     Visit Number 22    Number of Visits 36    Date for OT Re-Evaluation 07/15/21    Authorization Type 16 visits initially authorized    OT Start Time 0900    OT Stop Time 1003    OT Time Calculation (min) 63 min    Equipment Utilized During Treatment multiple examples of convoluted foam HOS devices to facilitate improved lymphatic flow during HOS    Activity Tolerance Patient tolerated treatment well;No increased pain    Behavior During Therapy WFL for tasks assessed/performed             Past Medical History:  Diagnosis Date   Anxiety    Arthritis    Diet-controlled diabetes mellitus ([place])    borderline   Fatigue    HLD (hyperlipidemia)    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; ER/PR (+), HER2/neu (-)   Metastasis to bone (HCC)    Breast primary   Murmur, cardiac    Peripheral edema    T wave inversion on electrocardiogram 05/17/2020   leads I, II, III, aVF, V3-V6    Past Surgical History:  Procedure Laterality Date   BREAST BIOPSY Right 05/02/2020   [place] Bx, Q clip, path pending   BREAST BIOPSY Right 05/02/2020   [place] Axilla Bx, path pending   CHOLECYSTECTOMY     COLONOSCOPY     LAPAROSCOPIC ABDOMINAL EXPLORATION     MASTECTOMY MODIFIED RADICAL Right 05/23/2020   Procedure: MASTECTOMY MODIFIED RADICAL, WITH EXCISION OF AXILLARY CONTENTS;  Surgeon: Robert Bellow, MD;  Location: [facility] ORS;  Service: General;  Laterality: Right;   PORTACATH PLACEMENT Left 05/23/2020   Procedure: INSERTION PORT-A-CATH;  Surgeon: Robert Bellow, MD;  Location: [facility] ORS;  Service: General;   Laterality: Left;    There were no vitals filed for this visit.   Subjective Assessment - 04/18/21 1624     Subjective  Kristy York presents for OT visit 22/36 to address RUE/RUQ post-mastectomy lymphedema with gradient compression wraps in place on RUE.  Pt denies LE related pain in the affected RUE/ RUQ. Pt is pleased that custom arm sleeve arrived from DME vendor today.    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 anxiety, arthritis, diet controlled diabetes mellitus, fatique, , heart murmer, periferal edema in feet and legs,    Limitations decreased RUE shoulder AROM, chronic RUE swelling and associated pain, fatigue    Repetition Increases Symptoms    Special Tests _ Stemmer R MPs    Pain Onset 1 to 4 weeks ago   3 weeks ago                         OT Treatments/Exercises (OP) - 04/18/21 1624       ADLs   ADL Education Given Yes      Manual Therapy  Manual Therapy Edema management    Edema Management fitted CUSTOM RUE, ccl 2 Elvarex classic armsleeve                    OT Education - 04/18/21 1625     Education Details Pt edu for donning and doffing custom compression armsleeve using assistive devices    Person(s) Educated Child(ren);Patient   [name]   Methods Explanation;Demonstration;Handout    Comprehension Verbalized understanding;Need further instruction                 OT Long Term Goals - 04/10/21 1452       OT LONG TERM GOAL #1   Title Given this patient's risk-adjustment variables, her Intake Functional Status score of 61/100 on the FOTO tool, patient will experience at least an increase in function of 11 points for a score of 72/100.    Baseline 61/100    Time 12    Period  Weeks    Status On-going    Target Date 07/10/21      OT LONG TERM GOAL #2   Title Pt will be able to apply multi-layer, short stretch compression wraps using correct gradient techniques with max caregiver assistance to return affected R limb to premorbid size and shape, to limit pain and infection risk, and to improve functional arm and hand use for ADLs.    Baseline dependent    Time 4    Period Days    Status Achieved      OT LONG TERM GOAL #3   Title With Max  CG Assist during visit intervals Pt  will achieve at least a 10%  limb volume reduction  in the R arm and hand during Intensive Phase CDT to achieve optimal limb volume reduction, to prevent re-accumulation of lymphatic congestion and progression of fibrosis, to limit infection risk, to improve functional arm/hand use, to improve functional performance of basic and instrumental ADLs, and to limit LE progression.    Baseline dependent    Time 12    Period Weeks    Status Achieved   02/22/21 Acieved with 25.3% limb volume reduction since 01/18/21. LVD now WNL at 3.06%, R (dominant/ Rx) > L     OT LONG TERM GOAL #4   Title With Max daily CG assistance Pt will achieve and sustain a least 85% compliance performing all daily LE self-care home program components throughout Intensive Phase CDT, including recommended skin care regime, lymphatic pumping ther ex, 23/7 compression wraps and simple self-MLD, to ensure optimal limb volume reduction, to limit infection risk and to limit LE progression.    Baseline dependent    Time 12    Period Weeks    Status Achieved      OT LONG TERM GOAL #5   Title By DC from OT Pt will be able to don and doff appropriate daytime compression garments and HOS devices with Mod CG assistance to limit lymphatic re-accumulation and LE progression with before transitioning to self-management phase of CDT.    Baseline Max A    Time 12    Period Weeks    Status On-going    Target Date 07/10/21      OT LONG TERM  GOAL #6   Title Pt will regain full RUE AROM  for flexion and abduction essential for optimal functional performance of basic and instrumental ADLs, productive activities,  and leisure pursuits by DC from OT.    Baseline Max A    Time  12    Period Weeks    Status Achieved                   Plan - 04/18/21 1626     Clinical Impression Statement Completed initial fitting of RUE custom, CCL 2, flat knit, Elvarex Classic armsleve and trained Pt to don and doff garment using various assistive devices, including friction gloves, arm butler  and easy lide rip stop nylon donning aid. Garment fit appears excellent. Pt will wear for next few days and report on comfort and function next week. By end of session Pt able to don sleeve with moderate assistance using donning butler and easy slide. Loaned devices so Pt may practice with them over the weekend and gave her printed resources for where to obtain them. Fit glove ASAP. It has not yet arrived. Cont as per POC.    OT Occupational Profile and History Problem Focused Assessment - Including review of records relating to presenting problem    Occupational performance deficits (Please refer to evaluation for details): ADL's;IADL's;Leisure;Work;Social Participation;Rest and Sleep    Body Structure / Function / Physical Skills ADL;Decreased knowledge of use of DME;Pain;Edema;UE functional use;IADL;ROM;Fascial restriction;Scar mobility;Skin integrity;Decreased knowledge of precautions    Rehab Potential Good    Clinical Decision Making Several treatment options, min-mod task modification necessary    Comorbidities Affecting Occupational Performance: Presence of comorbidities impacting occupational performance    Modification or Assistance to Complete Evaluation  Min-Moderate modification of tasks or assist with assess necessary to complete eval    OT Frequency 2x / week    OT Duration 12 weeks   and PRN for follow along support and garment replacement    OT Treatment/Interventions Self-care/ADL training;DME and/or AE instruction;Manual lymph drainage;Therapeutic activities;Compression bandaging;Therapeutic exercise;Scar mobilization;Coping strategies training;Manual Therapy;Patient/family education    Plan Complete Decongestive Therapy to RUE/RUQ to include manual lymphatic drainage a(MLD), skin care with low ph lotion/ castor oil , therapeutic lymphatic pumping exercise, compression wraps graduating to compression garments for daytime and device for HOS PRN. Will also incorporate kinesiotape, myofascial release PRN, scar massage PRN    Recommended Other Services fit with comfortable and effective gradient comptression garments. It;s likely Pt will need custom sleeve and glove, ccl 2 flat knit    Consulted and Agree with Plan of Care Patient             Patient will benefit from skilled therapeutic intervention in order to improve the following deficits and impairments:   Body Structure / Function / Physical Skills: ADL, Decreased knowledge of use of DME, Pain, Edema, UE functional use, IADL, ROM, Fascial restriction, Scar mobility, Skin integrity, Decreased knowledge of precautions       Visit Diagnosis: Postmastectomy lymphedema syndrome    Problem List Patient Active Problem List   Diagnosis Date Noted   Goals of care, counseling/discussion 05/17/2020   Breast cancer, right ([place]) 05/08/2020   Andrey Spearman, MS, OTR/L, [facility] 04/18/21 4:30 PM   [place] MAIN [facility] SERVICES [address] [place], [place], [address] Phone: [phone]   Fax:  [phone]  Name: Kristy York MRN: [id number] Date of Birth: [birth year]

## 2021-04-23 ENCOUNTER — Other Ambulatory Visit: Payer: Self-pay

## 2021-04-23 ENCOUNTER — Inpatient Hospital Stay: Payer: Medicare PPO | Attending: Oncology

## 2021-04-23 ENCOUNTER — Ambulatory Visit: Payer: Medicare PPO | Admitting: Occupational Therapy

## 2021-04-23 ENCOUNTER — Inpatient Hospital Stay: Payer: Medicare PPO

## 2021-04-23 VITALS — BP 132/80 | HR 70 | Temp 97.0°F | Resp 18

## 2021-04-23 DIAGNOSIS — C50911 Malignant neoplasm of unspecified site of right female breast: Secondary | ICD-10-CM | POA: Diagnosis not present

## 2021-04-23 DIAGNOSIS — C7951 Secondary malignant neoplasm of bone: Secondary | ICD-10-CM | POA: Insufficient documentation

## 2021-04-23 DIAGNOSIS — Z170 Estrogen receptor positive status [ER+]: Secondary | ICD-10-CM | POA: Diagnosis not present

## 2021-04-23 DIAGNOSIS — Z95828 Presence of other vascular implants and grafts: Secondary | ICD-10-CM

## 2021-04-23 LAB — BASIC METABOLIC PANEL
Anion gap: 9 (ref 5–15)
BUN: 15 mg/dL (ref 8–23)
CO2: 23 mmol/L (ref 22–32)
Calcium: 9.5 mg/dL (ref 8.9–10.3)
Chloride: 105 mmol/L (ref 98–111)
Creatinine, Ser: 0.83 mg/dL (ref 0.44–1.00)
GFR, Estimated: >60 mL/min (ref >60)
Glucose, Bld: 106 mg/dL — ABNORMAL HIGH (ref 70–99)
Potassium: 3.8 mmol/L (ref 3.5–5.1)
Sodium: 137 mmol/L (ref 135–145)

## 2021-04-23 MED ORDER — SODIUM CHLORIDE 0.9 % IV SOLN
INTRAVENOUS | Status: DC
  Filled 2021-04-23: qty 250

## 2021-04-23 MED ORDER — HEPARIN SOD (PORK) LOCK FLUSH 100 UNIT/ML IV SOLN
500.0000 [IU] | Freq: Once | INTRAVENOUS | Status: DC
  Administered 2021-04-23: 500 [IU] via INTRAVENOUS
  Filled 2021-04-23: qty 5

## 2021-04-23 MED ORDER — SODIUM CHLORIDE 0.9% FLUSH
10.0000 mL | Freq: Once | INTRAVENOUS | Status: AC
  Administered 2021-04-23: 10 mL via INTRAVENOUS
  Filled 2021-04-23: qty 10

## 2021-04-23 MED ORDER — ZOLEDRONIC ACID 4 MG/100ML IV SOLN
4.0000 mg | Freq: Once | INTRAVENOUS | Status: AC
  Administered 2021-04-23: 4 mg via INTRAVENOUS
  Filled 2021-04-23: qty 100

## 2021-04-23 NOTE — Patient Instructions (Addendum)
[facility]  Discharge Instructions: Thank you for choosing [facility] to provide your oncology and hematology care.  If you have a lab appointment with the Cancer Center, please go directly to the Cancer Center and check in at the registration area.  Wear comfortable clothing and clothing appropriate for easy access to any Portacath or PICC line.   We strive to give you quality time with your provider. You may need to reschedule your appointment if you arrive late (15 or more minutes).  Arriving late affects you and other patients whose appointments are after yours.  Also, if you miss three or more appointments without notifying the office, you may be dismissed from the clinic at the provider's discretion.      For prescription refill requests, have your pharmacy contact our office and allow 72 hours for refills to be completed.    Today you received the following chemotherapy and/or immunotherapy agents ZOMETA      To help prevent nausea and vomiting after your treatment, we encourage you to take your nausea medication as directed.  BELOW ARE SYMPTOMS THAT SHOULD BE REPORTED IMMEDIATELY: *FEVER GREATER THAN 100.4 F (38 C) OR HIGHER *CHILLS OR SWEATING *NAUSEA AND VOMITING THAT IS NOT CONTROLLED WITH YOUR NAUSEA MEDICATION *UNUSUAL SHORTNESS OF BREATH *UNUSUAL BRUISING OR BLEEDING *URINARY PROBLEMS (pain or burning when urinating, or frequent urination) *BOWEL PROBLEMS (unusual diarrhea, constipation, pain near the anus) TENDERNESS IN MOUTH AND THROAT WITH OR WITHOUT PRESENCE OF ULCERS (sore throat, sores in mouth, or a toothache) UNUSUAL RASH, SWELLING OR PAIN  UNUSUAL VAGINAL DISCHARGE OR ITCHING   Items with * indicate a potential emergency and should be followed up as soon as possible or go to the Emergency Department if any problems should occur.  Please show the CHEMOTHERAPY ALERT CARD or IMMUNOTHERAPY ALERT CARD at check-in to  the Emergency Department and triage nurse.  Should you have questions after your visit or need to cancel or reschedule your appointment, please contact [facility]  [phone] and follow the prompts.  Office hours are 8:00 a.m. to 4:30 p.m. Monday - Friday. Please note that voicemails left after 4:00 p.m. may not be returned until the following business day.  We are closed weekends and major holidays. You have access to a nurse at all times for urgent questions. Please call the main number to the clinic [phone] and follow the prompts.  For any non-urgent questions, you may also contact your provider using MyChart. We now offer e-Visits for anyone 18 and older to request care online for non-urgent symptoms. For details visit [email or web address].   Also download the MyChart app! Go to the app store, search "MyChart", open the app, select [place], and log in with your MyChart username and password.  Due to Covid, a mask is required upon entering the hospital/clinic. If you do not have a mask, one will be given to you upon arrival. For doctor visits, patients may have 1 support person aged 18 or older with them. For treatment visits, patients cannot have anyone with them due to current Covid guidelines and our immunocompromised population.   Zoledronic Acid Injection (Hypercalcemia, Oncology) What is this medication? ZOLEDRONIC ACID (ZOE le dron ik AS id) slows calcium loss from bones. It high calcium levels in the blood from some kinds of cancer. It may be used in other people at risk for bone loss. This medicine may be used for other purposes; ask   your health care provider or pharmacist if you have questions. COMMON BRAND NAME(S): Zometa What should I tell my care team before I take this medication? They need to know if you have any of these conditions: cancer dehydration dental disease kidney disease liver disease low levels of calcium in the  blood lung or breathing disease (asthma) receiving steroids like dexamethasone or prednisone an unusual or allergic reaction to zoledronic acid, other medicines, foods, dyes, or preservatives pregnant or trying to get pregnant breast-feeding How should I use this medication? This drug is injected into a vein. It is given by a health care provider in a hospital or clinic setting. Talk to your health care provider about the use of this drug in children. Special care may be needed. Overdosage: If you think you have taken too much of this medicine contact a poison control center or emergency room at once. NOTE: This medicine is only for you. Do not share this medicine with others. What if I miss a dose? Keep appointments for follow-up doses. It is important not to miss your dose. Call your health care provider if you are unable to keep an appointment. What may interact with this medication? certain antibiotics given by injection NSAIDs, medicines for pain and inflammation, like ibuprofen or naproxen some diuretics like bumetanide, furosemide teriparatide thalidomide This list may not describe all possible interactions. Give your health care provider a list of all the medicines, herbs, non-prescription drugs, or dietary supplements you use. Also tell them if you smoke, drink alcohol, or use illegal drugs. Some items may interact with your medicine. What should I watch for while using this medication? Visit your health care provider for regular checks on your progress. It may be some time before you see the benefit from this drug. Some people who take this drug have severe bone, joint, or muscle pain. This drug may also increase your risk for jaw problems or a broken thigh bone. Tell your health care provider right away if you have severe pain in your jaw, bones, joints, or muscles. Tell you health care provider if you have any pain that does not go away or that gets worse. Tell your dentist and  dental surgeon that you are taking this drug. You should not have major dental surgery while on this drug. See your dentist to have a dental exam and fix any dental problems before starting this drug. Take good care of your teeth while on this drug. Make sure you see your dentist for regular follow-up appointments. You should make sure you get enough calcium and vitamin D while you are taking this drug. Discuss the foods you eat and the vitamins you take with your health care provider. Check with your health care provider if you have severe diarrhea, nausea, and vomiting, or if you sweat a lot. The loss of too much body fluid may make it dangerous for you to take this drug. You may need blood work done while you are taking this drug. Do not become pregnant while taking this drug. Women should inform their health care provider if they wish to become pregnant or think they might be pregnant. There is potential for serious harm to an unborn child. Talk to your health care provider for more information. What side effects may I notice from receiving this medication? Side effects that you should report to your doctor or health care provider as soon as possible: allergic reactions (skin rash, itching or hives; swelling of the face, lips, or   tongue) bone pain infection (fever, chills, cough, sore throat, pain or trouble passing urine) jaw pain, especially after dental work joint pain kidney injury (trouble passing urine or change in the amount of urine) low blood pressure (dizziness; feeling faint or lightheaded, falls; unusually weak or tired) low calcium levels (fast heartbeat; muscle cramps or pain; pain, tingling, or numbness in the hands or feet; seizures) low magnesium levels (fast, irregular heartbeat; muscle cramp or pain; muscle weakness; tremors; seizures) low red blood cell counts (trouble breathing; feeling faint; lightheaded, falls; unusually weak or tired) muscle pain redness, blistering,  peeling, or loosening of the skin, including inside the mouth severe diarrhea swelling of the ankles, feet, hands trouble breathing Side effects that usually do not require medical attention (report to your doctor or health care provider if they continue or are bothersome): anxious constipation coughing depressed mood eye irritation, itching, or pain fever general ill feeling or flu-like symptoms nausea pain, redness, or irritation at site where injected trouble sleeping This list may not describe all possible side effects. Call your doctor for medical advice about side effects. You may report side effects to FDA at 1-800-FDA-1088. Where should I keep my medication? This drug is given in a hospital or clinic. It will not be stored at home. NOTE: This sheet is a summary. It may not cover all possible information. If you have questions about this medicine, talk to your doctor, pharmacist, or health care provider.  2022 Elsevier/Gold Standard (2019-07-07 09:13:00)  

## 2021-04-24 LAB — CANCER ANTIGEN 27.29: CA 27.29: 46.7 U/mL — ABNORMAL HIGH (ref 0.0–38.6)

## 2021-04-25 ENCOUNTER — Other Ambulatory Visit: Payer: Self-pay

## 2021-04-25 ENCOUNTER — Ambulatory Visit: Payer: Medicare PPO | Admitting: Occupational Therapy

## 2021-04-25 DIAGNOSIS — I972 Postmastectomy lymphedema syndrome: Secondary | ICD-10-CM

## 2021-04-25 NOTE — Therapy (Signed)
[place] [facility] [address] [place], [place], [address] Phone: [phone]   Fax:  [phone]  Occupational Therapy Treatment  Patient Details  Name: Kristy York MRN: [id number] Date of Birth: [birth year] Referring Provider (OT): Delight Hoh, MD   Encounter Date: 04/25/2021   OT End of Session - 04/25/21 0926     Visit Number 23    Number of Visits 36    Date for OT Re-Evaluation 07/15/21    Authorization Type --    OT Start Time 0910    OT Stop Time 1020    OT Time Calculation (min) 70 min    Equipment Utilized During Treatment multiple examples of convoluted foam HOS devices to facilitate improved lymphatic flow during HOS    Activity Tolerance Patient tolerated treatment well;No increased pain    Behavior During Therapy WFL for tasks assessed/performed             Past Medical History:  Diagnosis Date   Anxiety    Arthritis    Diet-controlled diabetes mellitus ([place])    borderline   Fatigue    HLD (hyperlipidemia)    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; ER/PR (+), HER2/neu (-)   Metastasis to bone ([facility])    Breast primary   Murmur, cardiac    Peripheral edema    T wave inversion on electrocardiogram 05/17/2020   leads I, II, III, aVF, V3-V6    Past Surgical History:  Procedure Laterality Date   BREAST BIOPSY Right 05/02/2020   [place] Bx, Q clip, path pending   BREAST BIOPSY Right 05/02/2020   [place] Axilla Bx, path pending   CHOLECYSTECTOMY     COLONOSCOPY     LAPAROSCOPIC ABDOMINAL EXPLORATION     MASTECTOMY MODIFIED RADICAL Right 05/23/2020   Procedure: MASTECTOMY MODIFIED RADICAL, WITH EXCISION OF AXILLARY CONTENTS;  Surgeon: Robert Bellow, MD;  Location: [facility] ORS;  Service: General;  Laterality: Right;   PORTACATH PLACEMENT Left 05/23/2020   Procedure: INSERTION PORT-A-CATH;  Surgeon: Robert Bellow, MD;  Location: [facility] ORS;  Service: General;  Laterality: Left;     There were no vitals filed for this visit.   Subjective Assessment - 04/25/21 1031     Subjective  Kristy York presents for OT visit 23/36 to address RUE/RUQ post-mastectomy lymphedema with gradient compression wraps in place on RUE.  Pt denies LE related pain in the affected RUE/ RUQ.    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 anxiety, arthritis, diet controlled diabetes mellitus, fatique, , heart murmer, periferal edema in feet and legs,    Limitations decreased RUE shoulder AROM, chronic RUE swelling and associated pain, fatigue    Repetition Increases Symptoms    Special Tests _ [place] R MPs    Pain Onset 1 to 4 weeks ago   3 weeks ago                         OT Treatments/Exercises (OP) - 04/25/21 1031       ADLs   ADL Education Given Yes      Manual Therapy   Manual Therapy Edema management    Edema Management fitted CUSTOM RUE, ccl 2 Elvarex  classic armsleeve    Manual Lymphatic Drainage (MLD) MLD to RUE/RUQ with emphasis on upper arm today.Next visit will focus on trunk and chest wall directing lymphatic congestion alternatively to adjacent L axilla and R inguinal anastamosis.                    OT Education - 04/25/21 1032     Education Details Pt edu for donning and doffing custom compression armsleeve using assistive devices    Person(s) Educated Child(ren);Patient   Kristy York   Methods Explanation;Demonstration;Handout    Comprehension Verbalized understanding;Need further instruction                 OT Long Term Goals - 04/10/21 1452       OT LONG TERM GOAL #1   Title Given this patient's risk-adjustment variables, her Intake Functional Status score of 61/100 on the FOTO tool,  patient will experience at least an increase in function of 11 points for a score of 72/100.    Baseline 61/100    Time 12    Period Weeks    Status On-going    Target Date 07/10/21      OT LONG TERM GOAL #2   Title Pt will be able to apply multi-layer, short stretch compression wraps using correct gradient techniques with max caregiver assistance to return affected R limb to premorbid size and shape, to limit pain and infection risk, and to improve functional arm and hand use for ADLs.    Baseline dependent    Time 4    Period Days    Status Achieved      OT LONG TERM GOAL #3   Title With Max  CG Assist during visit intervals Pt  will achieve at least a 10%  limb volume reduction  in the R arm and hand during Intensive Phase CDT to achieve optimal limb volume reduction, to prevent re-accumulation of lymphatic congestion and progression of fibrosis, to limit infection risk, to improve functional arm/hand use, to improve functional performance of basic and instrumental ADLs, and to limit LE progression.    Baseline dependent    Time 12    Period Weeks    Status Achieved   02/22/21 Acieved with 25.3% limb volume reduction since 01/18/21. LVD now WNL at 3.06%, R (dominant/ Rx) > L     OT LONG TERM GOAL #4   Title With Max daily CG assistance Pt will achieve and sustain a least 85% compliance performing all daily LE self-care home program components throughout Intensive Phase CDT, including recommended skin care regime, lymphatic pumping ther ex, 23/7 compression wraps and simple self-MLD, to ensure optimal limb volume reduction, to limit infection risk and to limit LE progression.    Baseline dependent    Time 12    Period Weeks    Status Achieved      OT LONG TERM GOAL #5   Title By DC from OT Pt will be able to don and doff appropriate daytime compression garments and HOS devices with Mod CG assistance to limit lymphatic re-accumulation and LE progression with before transitioning to  self-management phase of CDT.    Baseline Max A    Time 12    Period Weeks    Status On-going    Target Date 07/10/21      OT LONG TERM GOAL #6   Title Pt will regain full RUE AROM  for flexion and abduction essential for optimal functional performance of basic and instrumental  ADLs, productive activities,  and leisure pursuits by DC from OT.    Baseline Max A    Time 12    Period Weeks    Status Achieved                   Plan - 04/25/21 1026     Clinical Impression Statement Completed fitting for custom armsleeve. Pt reports garment is comfortable and she has worn it daily without difficulty since receiving it 2 days ago. Pt also uing loaned convoluted HOS device as directed with good comfort and positive indentations upon doffing, indicating correct contact with skin to limit fibrosis. Provided bried manual the5rapy today, then dedicated remainder of visit to Pt and family edu for donning aids, including Mediven arm butler, easy slide and friction gloves. Pt's son, Deidre Ala was able to use all of tese products to don garment with minimal physical effort. Provided printed resources for all devices. Still awaiting custom glove. We'll fit ASAP. Team is considering options for OT frequency as she transitions to self-management phase. Cnot as per POC.    OT Occupational Profile and History Problem Focused Assessment - Including review of records relating to presenting problem    Occupational performance deficits (Please refer to evaluation for details): ADL's;IADL's;Leisure;Work;Social Participation;Rest and Sleep    Body Structure / Function / Physical Skills ADL;Decreased knowledge of use of DME;Pain;Edema;UE functional use;IADL;ROM;Fascial restriction;Scar mobility;Skin integrity;Decreased knowledge of precautions    Rehab Potential Good    Clinical Decision Making Several treatment options, min-mod task modification necessary    Comorbidities Affecting Occupational Performance:  Presence of comorbidities impacting occupational performance    Modification or Assistance to Complete Evaluation  Min-Moderate modification of tasks or assist with assess necessary to complete eval    OT Frequency 2x / week    OT Duration 12 weeks   and PRN for follow along support and garment replacement   OT Treatment/Interventions Self-care/ADL training;DME and/or AE instruction;Manual lymph drainage;Therapeutic activities;Compression bandaging;Therapeutic exercise;Scar mobilization;Coping strategies training;Manual Therapy;Patient/family education    Plan Complete Decongestive Therapy to RUE/RUQ to include manual lymphatic drainage a(MLD), skin care with low ph lotion/ castor oil , therapeutic lymphatic pumping exercise, compression wraps graduating to compression garments for daytime and device for HOS PRN. Will also incorporate kinesiotape, myofascial release PRN, scar massage PRN    Recommended Other Services fit with comfortable and effective gradient comptression garments. It;s likely Pt will need custom sleeve and glove, ccl 2 flat knit    Consulted and Agree with Plan of Care Patient             Patient will benefit from skilled therapeutic intervention in order to improve the following deficits and impairments:   Body Structure / Function / Physical Skills: ADL, Decreased knowledge of use of DME, Pain, Edema, UE functional use, IADL, ROM, Fascial restriction, Scar mobility, Skin integrity, Decreased knowledge of precautions       Visit Diagnosis: Postmastectomy lymphedema syndrome    Problem List Patient Active Problem List   Diagnosis Date Noted   Goals of care, counseling/discussion 05/17/2020   Breast cancer, right ([place]) 05/08/2020   Andrey Spearman, MS, OTR/L, [facility] 04/25/21 10:33 AM   [place] [address] [place], [place], [address] Phone: [phone]   Fax:  [phone]  Name: Amilyah Nack MRN: [id number] Date of Birth: 02/18/1951

## 2021-04-30 ENCOUNTER — Ambulatory Visit: Payer: Medicare PPO | Admitting: Occupational Therapy

## 2021-04-30 ENCOUNTER — Other Ambulatory Visit: Payer: Self-pay | Admitting: Oncology

## 2021-04-30 ENCOUNTER — Other Ambulatory Visit: Payer: Self-pay

## 2021-04-30 DIAGNOSIS — I972 Postmastectomy lymphedema syndrome: Secondary | ICD-10-CM

## 2021-04-30 NOTE — Therapy (Signed)
[place] MAIN [facility] SERVICES [address] [place], [place], [address] Phone: [phone]   Fax:  [phone]  Occupational Therapy Treatment  Patient Details  Name: Kristy York MRN: [id number] Date of Birth: [birth year] Referring Provider (OT): Delight Hoh, MD   Encounter Date: 04/30/2021   OT End of Session - 04/30/21 0910     Visit Number 24    Number of Visits 36    Date for OT Re-Evaluation 07/15/21    OT Start Time 0905    OT Stop Time 1005    OT Time Calculation (min) 60 min    Equipment Utilized During Treatment multiple examples of convoluted foam HOS devices to facilitate improved lymphatic flow during HOS    Activity Tolerance Patient tolerated treatment well;No increased pain    Behavior During Therapy WFL for tasks assessed/performed             Past Medical History:  Diagnosis Date   Anxiety    Arthritis    Diet-controlled diabetes mellitus ([place])    borderline   Fatigue    HLD (hyperlipidemia)    Invasive carcinoma of breast ([place]) 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; ER/PR (+), HER2/neu (-)   Metastasis to bone ([facility])    Breast primary   Murmur, cardiac    Peripheral edema    T wave inversion on electrocardiogram 05/17/2020   leads I, II, III, aVF, V3-V6    Past Surgical History:  Procedure Laterality Date   BREAST BIOPSY Right 05/02/2020   [place] Bx, Q clip, path pending   BREAST BIOPSY Right 05/02/2020   [place] Axilla Bx, path pending   CHOLECYSTECTOMY     COLONOSCOPY     LAPAROSCOPIC ABDOMINAL EXPLORATION     MASTECTOMY MODIFIED RADICAL Right 05/23/2020   Procedure: MASTECTOMY MODIFIED RADICAL, WITH EXCISION OF AXILLARY CONTENTS;  Surgeon: Robert Bellow, MD;  Location: [facility] ORS;  Service: General;  Laterality: Right;   PORTACATH PLACEMENT Left 05/23/2020   Procedure: INSERTION PORT-A-CATH;  Surgeon: Robert Bellow, MD;  Location: [facility] ORS;  Service: General;  Laterality: Left;    There were no vitals filed  for this visit.   Subjective Assessment - 04/30/21 0912     Subjective  Kristy York presents for OT visit 24/36 to address RUE/RUQ post-mastectomy lymphedema with gradient compression wraps in place on RUE.  Pt denies LE related pain in the affected RUE/ RUQ.    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 anxiety, arthritis, diet controlled diabetes mellitus, fatique, , heart murmer, periferal edema in feet and legs,    Limitations decreased RUE shoulder AROM, chronic RUE swelling and associated pain, fatigue    Repetition Increases Symptoms    Special Tests _ Stemmer R MPs    Pain Onset 1 to 4 weeks ago   3 weeks ago                         OT Treatments/Exercises (OP) - 04/30/21 1300       ADLs   ADL Education Given Yes      Manual Therapy   Manual Therapy Edema management    Edema Management Max A to don R cstom arm sleeve    Manual  Lymphatic Drainage (MLD) MLD to RUE/RUQ with emphasis on upper arm today.Next visit will focus on trunk and chest wall directing lymphatic congestion alternatively to adjacent L axilla and R inguinal anastamosis.                    OT Education - 04/30/21 1301     Education Details Continued Pt/ CG edu for lymphedema self care  and home program throughout session. Topics include multilayer, gradient compression wrapping, simple self-MLD, therapeutic lymphatic pumping exercises, skin/nail care, risk reduction factors and LE precautions, compression garments/recommendations and wear and care schedule and compression garment donning / doffing using assistive devices. All questions answered to the Pt's satisfaction, and Pt demonstrates understanding by report.    Person(s) Educated  Child(ren);Patient   Kristy York   Methods Explanation;Demonstration;Handout    Comprehension Verbalized understanding;Need further instruction                 OT Long Term Goals - 04/10/21 1452       OT LONG TERM GOAL #1   Title Given this patient's risk-adjustment variables, her Intake Functional Status score of 61/100 on the FOTO tool, patient will experience at least an increase in function of 11 points for a score of 72/100.    Baseline 61/100    Time 12    Period Weeks    Status On-going    Target Date 07/10/21      OT LONG TERM GOAL #2   Title Pt will be able to apply multi-layer, short stretch compression wraps using correct gradient techniques with max caregiver assistance to return affected R limb to premorbid size and shape, to limit pain and infection risk, and to improve functional arm and hand use for ADLs.    Baseline dependent    Time 4    Period Days    Status Achieved      OT LONG TERM GOAL #3   Title With Max  CG Assist during visit intervals Pt  will achieve at least a 10%  limb volume reduction  in the R arm and hand during Intensive Phase CDT to achieve optimal limb volume reduction, to prevent re-accumulation of lymphatic congestion and progression of fibrosis, to limit infection risk, to improve functional arm/hand use, to improve functional performance of basic and instrumental ADLs, and to limit LE progression.    Baseline dependent    Time 12    Period Weeks    Status Achieved   02/22/21 Acieved with 25.3% limb volume reduction since 01/18/21. LVD now WNL at 3.06%, R (dominant/ Rx) > L     OT LONG TERM GOAL #4   Title With Max daily CG assistance Pt will achieve and sustain a least 85% compliance performing all daily LE self-care home program components throughout Intensive Phase CDT, including recommended skin care regime, lymphatic pumping ther ex, 23/7 compression wraps and simple self-MLD, to ensure optimal limb volume reduction, to limit infection risk  and to limit LE progression.    Baseline dependent    Time 12    Period Weeks    Status Achieved      OT LONG TERM GOAL #5   Title By DC from OT Pt will be able to don and doff appropriate daytime compression garments and HOS devices with Mod CG assistance to limit lymphatic re-accumulation and LE progression with before transitioning to self-management phase of CDT.    Baseline Max A    Time 12    Period  Weeks    Status On-going    Target Date 07/10/21      OT LONG TERM GOAL #6   Title Pt will regain full RUE AROM  for flexion and abduction essential for optimal functional performance of basic and instrumental ADLs, productive activities,  and leisure pursuits by DC from OT.    Baseline Max A    Time 12    Period Weeks    Status Achieved                   Plan - 04/30/21 1256     Clinical Impression Statement Pt tolerated MLD, skin care established without increased pain. Limb volume reduction is stable with newly fitted custom compression arm sleeve. DME vendor checking status of custom glove as this garment still has not arrived from manufacturer. Pt continues to perform all LE self care home program components between sessions. Fit remaining custom garment ASAP and consider decreasing OT frequency as Pt transitions to follow along status.    OT Occupational Profile and History Comprehensive Assessment- Review of records and extensive additional review of physical, cognitive, psychosocial history related to current functional performance    Occupational performance deficits (Please refer to evaluation for details): ADL's;IADL's;Leisure;Work;Social Participation;Rest and Sleep    Body Structure / Function / Physical Skills ADL;Decreased knowledge of use of DME;Pain;Edema;UE functional use;IADL;ROM;Fascial restriction;Scar mobility;Skin integrity;Decreased knowledge of precautions    Rehab Potential Good    Clinical Decision Making Several treatment options, min-mod task  modification necessary    Comorbidities Affecting Occupational Performance: Presence of comorbidities impacting occupational performance    Modification or Assistance to Complete Evaluation  Min-Moderate modification of tasks or assist with assess necessary to complete eval    OT Frequency 2x / week    OT Duration 12 weeks   and PRN for follow along support and garment replacement   OT Treatment/Interventions Self-care/ADL training;DME and/or AE instruction;Manual lymph drainage;Therapeutic activities;Compression bandaging;Therapeutic exercise;Scar mobilization;Coping strategies training;Manual Therapy;Patient/family education    Plan Complete Decongestive Therapy to RUE/RUQ to include manual lymphatic drainage a(MLD), skin care with low ph lotion/ castor oil , therapeutic lymphatic pumping exercise, compression wraps graduating to compression garments for daytime and device for HOS PRN. Will also incorporate kinesiotape, myofascial release PRN, scar massage PRN    Recommended Other Services fit with comfortable and effective gradient comptression garments. It;s likely Pt will need custom sleeve and glove, ccl 2 flat knit    Consulted and Agree with Plan of Care Patient             Patient will benefit from skilled therapeutic intervention in order to improve the following deficits and impairments:   Body Structure / Function / Physical Skills: ADL, Decreased knowledge of use of DME, Pain, Edema, UE functional use, IADL, ROM, Fascial restriction, Scar mobility, Skin integrity, Decreased knowledge of precautions       Visit Diagnosis: Postmastectomy lymphedema syndrome    Problem List Patient Active Problem List   Diagnosis Date Noted   Goals of care, counseling/discussion 05/17/2020   Breast cancer, right ([place]) 05/08/2020   Andrey Spearman, MS, OTR/L, CLT-LANA 04/30/21 1:02 PM   [place] MAIN [facility] SERVICES [address] [place],  [place], [address] Phone: [phone]   Fax:  [phone]  Name: Kristy York MRN: [id number] Date of Birth: Jul 03, 1951

## 2021-05-02 ENCOUNTER — Ambulatory Visit: Payer: Medicare PPO | Admitting: Occupational Therapy

## 2021-05-02 ENCOUNTER — Other Ambulatory Visit: Payer: Self-pay

## 2021-05-02 DIAGNOSIS — I972 Postmastectomy lymphedema syndrome: Secondary | ICD-10-CM | POA: Diagnosis not present

## 2021-05-02 NOTE — Therapy (Signed)
[place] MAIN [facility] SERVICES [address] [place], [place], [address] Phone: [phone]   Fax:  [phone]  Occupational Therapy Treatment  Patient Details  Name: Kristy York MRN: [id number] Date of Birth: 10-21-1950 Referring Provider (OT): Delight Hoh, MD   Encounter Date: 05/02/2021   OT End of Session - 05/02/21 0915     Visit Number 25    Number of Visits 36    Date for OT Re-Evaluation 07/15/21    OT Start Time 0905    OT Stop Time 1005    OT Time Calculation (min) 60 min    Equipment Utilized During Treatment multiple examples of convoluted foam HOS devices to facilitate improved lymphatic flow during HOS    Activity Tolerance Patient tolerated treatment well;No increased pain    Behavior During Therapy WFL for tasks assessed/performed             Past Medical History:  Diagnosis Date   Anxiety    Arthritis    Diet-controlled diabetes mellitus ([place])    borderline   Fatigue    HLD (hyperlipidemia)    Invasive carcinoma of breast ([place]) 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; ER/PR (+), HER2/neu (-)   Metastasis to bone ([facility])    Breast primary   Murmur, cardiac    Peripheral edema    T wave inversion on electrocardiogram 05/17/2020   leads I, II, III, aVF, V3-V6    Past Surgical History:  Procedure Laterality Date   BREAST BIOPSY Right 05/02/2020   [place] Bx, Q clip, path pending   BREAST BIOPSY Right 05/02/2020   [place] Axilla Bx, path pending   CHOLECYSTECTOMY     COLONOSCOPY     LAPAROSCOPIC ABDOMINAL EXPLORATION     MASTECTOMY MODIFIED RADICAL Right 05/23/2020   Procedure: MASTECTOMY MODIFIED RADICAL, WITH EXCISION OF AXILLARY CONTENTS;  Surgeon: Robert Bellow, MD;  Location: [facility] ORS;  Service: General;  Laterality: Right;   PORTACATH PLACEMENT Left 05/23/2020   Procedure: INSERTION PORT-A-CATH;  Surgeon: Robert Bellow, MD;  Location: [facility] ORS;  Service: General;  Laterality: Left;    There were no vitals filed  for this visit.   Subjective Assessment - 05/02/21 1032     Subjective  Kristy York presents for OT visit 25/36 to address RUE/RUQ post-mastectomy lymphedema with custom compression arm sleeve in place. Pt denies arm, shoulder and chest wall pain. Pt has no new complaints. Custom compression glove has not yet arrived from manufacturer. DME vendor is tracking parcel for [place].    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 anxiety, arthritis, diet controlled diabetes mellitus, fatique, , heart murmer, periferal edema in feet and legs,    Limitations decreased RUE shoulder AROM, chronic RUE swelling and associated pain, fatigue    Repetition Increases Symptoms    Special Tests _ Stemmer R MPs    Pain Onset 1 to 4 weeks ago   3 weeks ago                         OT Treatments/Exercises (OP) - 05/02/21 1034       ADLs   ADL Education Given Yes      Manual Therapy   Manual Therapy Edema management  Edema Management Max A to don R cstom arm sleeve    Manual Lymphatic Drainage (MLD) MLD to RUE/RUQ with emphasis on upper arm today.Next visit will focus on trunk and chest wall directing lymphatic congestion alternatively to adjacent L axilla and R inguinal anastamosis.                    OT Education - 05/02/21 1034     Education Details Continued Pt/ CG edu for lymphedema self care  and home program throughout session. Topics include multilayer, gradient compression wrapping, simple self-MLD, therapeutic lymphatic pumping exercises, skin/nail care, risk reduction factors and LE precautions, compression garments/recommendations and wear and care schedule and compression garment donning / doffing using assistive devices. All  questions answered to the Pt's satisfaction, and Pt demonstrates understanding by report.    Person(s) Educated Child(ren);Patient   Kristy York   Methods Explanation;Demonstration;Handout    Comprehension Verbalized understanding;Need further instruction                 OT Long Term Goals - 04/10/21 1452       OT LONG TERM GOAL #1   Title Given this patient's risk-adjustment variables, her Intake Functional Status score of 61/100 on the FOTO tool, patient will experience at least an increase in function of 11 points for a score of 72/100.    Baseline 61/100    Time 12    Period Weeks    Status On-going    Target Date 07/10/21      OT LONG TERM GOAL #2   Title Pt will be able to apply multi-layer, short stretch compression wraps using correct gradient techniques with max caregiver assistance to return affected R limb to premorbid size and shape, to limit pain and infection risk, and to improve functional arm and hand use for ADLs.    Baseline dependent    Time 4    Period Days    Status Achieved      OT LONG TERM GOAL #3   Title With Max  CG Assist during visit intervals Pt  will achieve at least a 10%  limb volume reduction  in the R arm and hand during Intensive Phase CDT to achieve optimal limb volume reduction, to prevent re-accumulation of lymphatic congestion and progression of fibrosis, to limit infection risk, to improve functional arm/hand use, to improve functional performance of basic and instrumental ADLs, and to limit LE progression.    Baseline dependent    Time 12    Period Weeks    Status Achieved   02/22/21 Acieved with 25.3% limb volume reduction since 01/18/21. LVD now WNL at 3.06%, R (dominant/ Rx) > L     OT LONG TERM GOAL #4   Title With Max daily CG assistance Pt will achieve and sustain a least 85% compliance performing all daily LE self-care home program components throughout Intensive Phase CDT, including recommended skin care regime, lymphatic pumping ther  ex, 23/7 compression wraps and simple self-MLD, to ensure optimal limb volume reduction, to limit infection risk and to limit LE progression.    Baseline dependent    Time 12    Period Weeks    Status Achieved      OT LONG TERM GOAL #5   Title By DC from OT Pt will be able to don and doff appropriate daytime compression garments and HOS devices with Mod CG assistance to limit lymphatic re-accumulation and LE progression with before transitioning to self-management phase of CDT.  Baseline Max A    Time 12    Period Weeks    Status On-going    Target Date 07/10/21      OT LONG TERM GOAL #6   Title Pt will regain full RUE AROM  for flexion and abduction essential for optimal functional performance of basic and instrumental ADLs, productive activities,  and leisure pursuits by DC from OT.    Baseline Max A    Time 12    Period Weeks    Status Achieved                   Plan - 05/02/21 1035     Clinical Impression Statement Pt cntinues to benefit from CDT to RUE/ RUQ for post-mastectomy lymphedema. Pt is 100 % compliant with daily wear of custom compression arm sleeve. Skin is in excellent condition and swelling in the rm is well  controlled. Custom glove has not yet arrived from DME vendor. Despite having no glove to wear with compression sleeve at present, fortunately hand swelling has not been an issue,. Mild, non-pitting, thickened, protien rich fibrosis remains palpable distal to elbow on volar fa in region of flexor carpi radialis. Pt tolerated MLD with fibrosis techniques to FA today without increased pain or increase in swelling. Cont as per POC. Fit custom glove ASAP.    OT Occupational Profile and History Comprehensive Assessment- Review of records and extensive additional review of physical, cognitive, psychosocial history related to current functional performance    Occupational performance deficits (Please refer to evaluation for details):  ADL's;IADL's;Leisure;Work;Social Participation;Rest and Sleep    Body Structure / Function / Physical Skills ADL;Decreased knowledge of use of DME;Pain;Edema;UE functional use;IADL;ROM;Fascial restriction;Scar mobility;Skin integrity;Decreased knowledge of precautions    Rehab Potential Good    Clinical Decision Making Several treatment options, min-mod task modification necessary    Comorbidities Affecting Occupational Performance: Presence of comorbidities impacting occupational performance    Modification or Assistance to Complete Evaluation  Min-Moderate modification of tasks or assist with assess necessary to complete eval    OT Frequency 2x / week    OT Duration 12 weeks   and PRN for follow along support and garment replacement   OT Treatment/Interventions Self-care/ADL training;DME and/or AE instruction;Manual lymph drainage;Therapeutic activities;Compression bandaging;Therapeutic exercise;Scar mobilization;Coping strategies training;Manual Therapy;Patient/family education    Plan Complete Decongestive Therapy to RUE/RUQ to include manual lymphatic drainage a(MLD), skin care with low ph lotion/ castor oil , therapeutic lymphatic pumping exercise, compression wraps graduating to compression garments for daytime and device for HOS PRN. Will also incorporate kinesiotape, myofascial release PRN, scar massage PRN    Recommended Other Services fit with comfortable and effective gradient comptression garments. It;s likely Pt will need custom sleeve and glove, ccl 2 flat knit    Consulted and Agree with Plan of Care Patient             Patient will benefit from skilled therapeutic intervention in order to improve the following deficits and impairments:   Body Structure / Function / Physical Skills: ADL, Decreased knowledge of use of DME, Pain, Edema, UE functional use, IADL, ROM, Fascial restriction, Scar mobility, Skin integrity, Decreased knowledge of precautions       Visit  Diagnosis: Postmastectomy lymphedema syndrome    Problem List Patient Active Problem List   Diagnosis Date Noted   Goals of care, counseling/discussion 05/17/2020   Breast cancer, right ([place]) 05/08/2020   Andrey Spearman, MS, OTR/L, CLT-LANA 05/02/21 10:51 AM    [place] MAIN [facility] SERVICES [address] [place], [place], [address] Phone: [phone]   Fax:  [phone]  Name: Kristy York MRN: [id number] Date of Birth: 1951/09/28

## 2021-05-07 ENCOUNTER — Ambulatory Visit: Payer: Medicare PPO | Attending: Oncology | Admitting: Occupational Therapy

## 2021-05-07 ENCOUNTER — Other Ambulatory Visit: Payer: Self-pay

## 2021-05-07 DIAGNOSIS — I972 Postmastectomy lymphedema syndrome: Secondary | ICD-10-CM | POA: Diagnosis present

## 2021-05-07 NOTE — Therapy (Signed)
[place] MAIN [facility] SERVICES [address] [place], [place], [address] Phone: [phone]   Fax:  [phone]  Occupational Therapy Treatment  Patient Details  Name: Kristy York MRN: [id number] Date of Birth: [birth year] Referring Provider (OT): Delight Hoh, MD   Encounter Date: 05/07/2021   OT End of Session - 05/07/21 1005     Visit Number 26    Number of Visits 36    Date for OT Re-Evaluation 07/15/21    OT Start Time 0901    OT Stop Time 1005    OT Time Calculation (min) 64 min    Equipment Utilized During Treatment multiple examples of convoluted foam HOS devices to facilitate improved lymphatic flow during HOS    Activity Tolerance Patient tolerated treatment well;No increased pain    Behavior During Therapy WFL for tasks assessed/performed             Past Medical History:  Diagnosis Date   Anxiety    Arthritis    Diet-controlled diabetes mellitus ([place])    borderline   Fatigue    HLD (hyperlipidemia)    Invasive carcinoma of breast ([place]) 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; ER/PR (+), HER2/neu (-)   Metastasis to bone (HCC)    Breast primary   Murmur, cardiac    Peripheral edema    T wave inversion on electrocardiogram 05/17/2020   leads I, II, III, aVF, V3-V6    Past Surgical History:  Procedure Laterality Date   BREAST BIOPSY Right 05/02/2020   [place] Bx, Q clip, path pending   BREAST BIOPSY Right 05/02/2020   [place] Axilla Bx, path pending   CHOLECYSTECTOMY     COLONOSCOPY     LAPAROSCOPIC ABDOMINAL EXPLORATION     MASTECTOMY MODIFIED RADICAL Right 05/23/2020   Procedure: MASTECTOMY MODIFIED RADICAL, WITH EXCISION OF AXILLARY CONTENTS;  Surgeon: Robert Bellow, MD;  Location: [facility] ORS;  Service: General;  Laterality: Right;   PORTACATH PLACEMENT Left 05/23/2020   Procedure: INSERTION PORT-A-CATH;  Surgeon: Robert Bellow, MD;  Location: [facility] ORS;  Service: General;  Laterality: Left;    There were no vitals filed  for this visit.   Subjective Assessment - 05/07/21 1324     Subjective  [name] presents for OT visit 26/36 to address RUE/RUQ post-mastectomy lymphedema with custom compression arm sleeve in place. Pt denies arm, shoulder and chest wall pain. Pt has no new complaints. Custom compression glove has not yet arrived from manufacturer. DME vendor confirms glove was shipped from manufacturer on 7/29.    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 anxiety, arthritis, diet controlled diabetes mellitus, fatique, , heart murmer, periferal edema in feet and legs,    Limitations decreased RUE shoulder AROM, chronic RUE swelling and associated pain, fatigue    Repetition Increases Symptoms    Special Tests _ Stemmer R MPs    Pain Onset 1 to 4 weeks ago   3 weeks ago                         OT Treatments/Exercises (OP) - 05/07/21 1325       ADLs   ADL Education Given Yes      Manual Therapy   Manual  Therapy Edema management;Manual Lymphatic Drainage (MLD);Compression Bandaging    Edema Management Max A to don R cstom arm sleeve    Manual Lymphatic Drainage (MLD) MLD to RUE/RUQ with emphasis on upper arm today.Next visit will focus on trunk and chest wall directing lymphatic congestion alternatively to adjacent L axilla and R inguinal anastamosis.                    OT Education - 05/07/21 1326     Education Details Emphasis of skilled LE self-care training today on  simple self-MLD. No return.    Person(s) Educated Child(ren);Patient   Kristy York   Methods Explanation;Demonstration;Handout    Comprehension Verbalized understanding;Need further instruction                 OT Long Term Goals - 04/10/21 1452       OT  LONG TERM GOAL #1   Title Given this patient's risk-adjustment variables, her Intake Functional Status score of 61/100 on the FOTO tool, patient will experience at least an increase in function of 11 points for a score of 72/100.    Baseline 61/100    Time 12    Period Weeks    Status On-going    Target Date 07/10/21      OT LONG TERM GOAL #2   Title Pt will be able to apply multi-layer, short stretch compression wraps using correct gradient techniques with max caregiver assistance to return affected R limb to premorbid size and shape, to limit pain and infection risk, and to improve functional arm and hand use for ADLs.    Baseline dependent    Time 4    Period Days    Status Achieved      OT LONG TERM GOAL #3   Title With Max  CG Assist during visit intervals Pt  will achieve at least a 10%  limb volume reduction  in the R arm and hand during Intensive Phase CDT to achieve optimal limb volume reduction, to prevent re-accumulation of lymphatic congestion and progression of fibrosis, to limit infection risk, to improve functional arm/hand use, to improve functional performance of basic and instrumental ADLs, and to limit LE progression.    Baseline dependent    Time 12    Period Weeks    Status Achieved   02/22/21 Acieved with 25.3% limb volume reduction since 01/18/21. LVD now WNL at 3.06%, R (dominant/ Rx) > L     OT LONG TERM GOAL #4   Title With Max daily CG assistance Pt will achieve and sustain a least 85% compliance performing all daily LE self-care home program components throughout Intensive Phase CDT, including recommended skin care regime, lymphatic pumping ther ex, 23/7 compression wraps and simple self-MLD, to ensure optimal limb volume reduction, to limit infection risk and to limit LE progression.    Baseline dependent    Time 12    Period Weeks    Status Achieved      OT LONG TERM GOAL #5   Title By DC from OT Pt will be able to don and doff appropriate daytime compression  garments and HOS devices with Mod CG assistance to limit lymphatic re-accumulation and LE progression with before transitioning to self-management phase of CDT.    Baseline Max A    Time 12    Period Weeks    Status On-going    Target Date 07/10/21      OT LONG TERM GOAL #6   Title Pt  will regain full RUE AROM  for flexion and abduction essential for optimal functional performance of basic and instrumental ADLs, productive activities,  and leisure pursuits by DC from OT.    Baseline Max A    Time 12    Period Weeks    Status Achieved                   Plan - 05/07/21 1328     Clinical Impression Statement RUE/RUQ is well decongested today. Pt demonstrates 100% compliance with custom arm sleeve for daytime use, and she used HOS device nighhtly to limit fibrosis formation. Provided Pt edu for simple self-MLD uilizing contralateral anterior axillary anastamosis for offloading affected RUE/RUQ. Discussed lymphatic structures and function, described J stroke and alternative pathways.Next visit we'll continue edu for self-MLD with opportunities to practice. Pt tolerated MLD without increased pain today. Cont as per POC.    OT Occupational Profile and History Comprehensive Assessment- Review of records and extensive additional review of physical, cognitive, psychosocial history related to current functional performance    Occupational performance deficits (Please refer to evaluation for details): ADL's;IADL's;Leisure;Work;Social Participation;Rest and Sleep    Body Structure / Function / Physical Skills ADL;Decreased knowledge of use of DME;Pain;Edema;UE functional use;IADL;ROM;Fascial restriction;Scar mobility;Skin integrity;Decreased knowledge of precautions    Rehab Potential Good    Clinical Decision Making Several treatment options, min-mod task modification necessary    Comorbidities Affecting Occupational Performance: Presence of comorbidities impacting occupational performance     Modification or Assistance to Complete Evaluation  Min-Moderate modification of tasks or assist with assess necessary to complete eval    OT Frequency 2x / week    OT Duration 12 weeks   and PRN for follow along support and garment replacement   OT Treatment/Interventions Self-care/ADL training;DME and/or AE instruction;Manual lymph drainage;Therapeutic activities;Compression bandaging;Therapeutic exercise;Scar mobilization;Coping strategies training;Manual Therapy;Patient/family education    Plan Complete Decongestive Therapy to RUE/RUQ to include manual lymphatic drainage a(MLD), skin care with low ph lotion/ castor oil , therapeutic lymphatic pumping exercise, compression wraps graduating to compression garments for daytime and device for HOS PRN. Will also incorporate kinesiotape, myofascial release PRN, scar massage PRN    Recommended Other Services fit with comfortable and effective gradient comptression garments. It;s likely Pt will need custom sleeve and glove, ccl 2 flat knit    Consulted and Agree with Plan of Care Patient             Patient will benefit from skilled therapeutic intervention in order to improve the following deficits and impairments:   Body Structure / Function / Physical Skills: ADL, Decreased knowledge of use of DME, Pain, Edema, UE functional use, IADL, ROM, Fascial restriction, Scar mobility, Skin integrity, Decreased knowledge of precautions       Visit Diagnosis: Postmastectomy lymphedema syndrome    Problem List Patient Active Problem List   Diagnosis Date Noted   Goals of care, counseling/discussion 05/17/2020   Breast cancer, right ([place]) 05/08/2020    Andrey Spearman, MS, OTR/L, CLT-LANA 05/07/21 1:33 PM    [place] MAIN [facility] SERVICES [address] [place], [place], [address] Phone: [phone]   Fax:  [phone]  Name: Mandy Peeks MRN: [id number] Date of Birth: 10-15-1950

## 2021-05-09 ENCOUNTER — Other Ambulatory Visit: Payer: Self-pay

## 2021-05-09 ENCOUNTER — Ambulatory Visit: Payer: Medicare PPO | Admitting: Occupational Therapy

## 2021-05-09 DIAGNOSIS — I972 Postmastectomy lymphedema syndrome: Secondary | ICD-10-CM | POA: Diagnosis not present

## 2021-05-09 NOTE — Therapy (Signed)
[place] MAIN [facility] SERVICES [address] [place], [place], [address] Phone: [phone]   Fax:  [phone]  Occupational Therapy Treatment  Patient Details  Name: Kristy York MRN: [id number] Date of Birth: 08-17-1951 Referring Provider (OT): Delight Hoh, MD   Encounter Date: 05/09/2021   OT End of Session - 05/09/21 0911     Visit Number 27    Number of Visits 36    Date for OT Re-Evaluation 07/15/21    OT Start Time 0900    OT Stop Time 1000    OT Time Calculation (min) 60 min    Equipment Utilized During Treatment multiple examples of convoluted foam HOS devices to facilitate improved lymphatic flow during HOS    Activity Tolerance Patient tolerated treatment well;No increased pain    Behavior During Therapy WFL for tasks assessed/performed             Past Medical History:  Diagnosis Date   Anxiety    Arthritis    Diet-controlled diabetes mellitus ([place])    borderline   Fatigue    HLD (hyperlipidemia)    Invasive carcinoma of breast ([place]) 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; ER/PR (+), HER2/neu (-)   Metastasis to bone (HCC)    Breast primary   Murmur, cardiac    Peripheral edema    T wave inversion on electrocardiogram 05/17/2020   leads I, II, III, aVF, V3-V6    Past Surgical History:  Procedure Laterality Date   BREAST BIOPSY Right 05/02/2020   [place] Bx, Q clip, path pending   BREAST BIOPSY Right 05/02/2020   [place] Axilla Bx, path pending   CHOLECYSTECTOMY     COLONOSCOPY     LAPAROSCOPIC ABDOMINAL EXPLORATION     MASTECTOMY MODIFIED RADICAL Right 05/23/2020   Procedure: MASTECTOMY MODIFIED RADICAL, WITH EXCISION OF AXILLARY CONTENTS;  Surgeon: Robert Bellow, MD;  Location: [facility] ORS;  Service: General;  Laterality: Right;   PORTACATH PLACEMENT Left 05/23/2020   Procedure: INSERTION PORT-A-CATH;  Surgeon: Robert Bellow, MD;  Location: [facility] ORS;  Service: General;  Laterality: Left;    There were no vitals filed  for this visit.   Subjective Assessment - 05/09/21 1302     Subjective  Kristy York presents for OT visit 27/36 to address RUE/RUQ post-mastectomy lymphedema with custom compression arm sleeve in place. Pt denies arm, shoulder and chest wall pain. Pt has no new complaints. Custom compression glove has not yet arrived from manufacturer.    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 anxiety, arthritis, diet controlled diabetes mellitus, fatique, , heart murmer, periferal edema in feet and legs,    Limitations decreased RUE shoulder AROM, chronic RUE swelling and associated pain, fatigue    Repetition Increases Symptoms    Special Tests _ Stemmer R MPs    Pain Onset 1 to 4 weeks ago   3 weeks ago                         OT Treatments/Exercises (OP) - 05/09/21 1303       ADLs   ADL Education Given Yes      Manual Therapy   Manual Therapy Edema management;Manual Lymphatic Drainage (MLD);Compression Bandaging  Edema Management 3 layer compression wraps as established    Manual Lymphatic Drainage (MLD) MLD to RUE/RUQ with emphasis on upper arm today.Next visit will focus on trunk and chest wall directing lymphatic congestion alternatively to adjacent L axilla and R inguinal anastamosis.                    OT Education - 05/09/21 1304     Education Details Emphasis of skilled LE self-care training today on  simple self-MLD. No return.    Person(s) Educated Child(ren);Patient   Kristy York   Methods Explanation;Demonstration;Handout    Comprehension Verbalized understanding;Need further instruction                 OT Long Term Goals - 04/10/21 1452       OT LONG TERM GOAL #1   Title Given this patient's  risk-adjustment variables, her Intake Functional Status score of 61/100 on the FOTO tool, patient will experience at least an increase in function of 11 points for a score of 72/100.    Baseline 61/100    Time 12    Period Weeks    Status On-going    Target Date 07/10/21      OT LONG TERM GOAL #2   Title Pt will be able to apply multi-layer, short stretch compression wraps using correct gradient techniques with max caregiver assistance to return affected R limb to premorbid size and shape, to limit pain and infection risk, and to improve functional arm and hand use for ADLs.    Baseline dependent    Time 4    Period Days    Status Achieved      OT LONG TERM GOAL #3   Title With Max  CG Assist during visit intervals Pt  will achieve at least a 10%  limb volume reduction  in the R arm and hand during Intensive Phase CDT to achieve optimal limb volume reduction, to prevent re-accumulation of lymphatic congestion and progression of fibrosis, to limit infection risk, to improve functional arm/hand use, to improve functional performance of basic and instrumental ADLs, and to limit LE progression.    Baseline dependent    Time 12    Period Weeks    Status Achieved   02/22/21 Acieved with 25.3% limb volume reduction since 01/18/21. LVD now WNL at 3.06%, R (dominant/ Rx) > L     OT LONG TERM GOAL #4   Title With Max daily CG assistance Pt will achieve and sustain a least 85% compliance performing all daily LE self-care home program components throughout Intensive Phase CDT, including recommended skin care regime, lymphatic pumping ther ex, 23/7 compression wraps and simple self-MLD, to ensure optimal limb volume reduction, to limit infection risk and to limit LE progression.    Baseline dependent    Time 12    Period Weeks    Status Achieved      OT LONG TERM GOAL #5   Title By DC from OT Pt will be able to don and doff appropriate daytime compression garments and HOS devices with Mod CG assistance  to limit lymphatic re-accumulation and LE progression with before transitioning to self-management phase of CDT.    Baseline Max A    Time 12    Period Weeks    Status On-going    Target Date 07/10/21      OT LONG TERM GOAL #6   Title Pt will regain full RUE AROM  for flexion and abduction essential for  optimal functional performance of basic and instrumental ADLs, productive activities,  and leisure pursuits by DC from OT.    Baseline Max A    Time 12    Period Weeks    Status Achieved                   Plan - 05/09/21 1301     Clinical Impression Statement Pt tolerated MLD, skin care established without increased pain. Limb volume reduction is stable with newly fitted custom compression arm sleeve. Applied gradient compression wraps so patient can wash sleeve after visit today . This will assist with bringing garment back into shape. Cont as per POC.    OT Occupational Profile and History Comprehensive Assessment- Review of records and extensive additional review of physical, cognitive, psychosocial history related to current functional performance    Occupational performance deficits (Please refer to evaluation for details): ADL's;IADL's;Leisure;Work;Social Participation;Rest and Sleep    Body Structure / Function / Physical Skills ADL;Decreased knowledge of use of DME;Pain;Edema;UE functional use;IADL;ROM;Fascial restriction;Scar mobility;Skin integrity;Decreased knowledge of precautions    Rehab Potential Good    Clinical Decision Making Several treatment options, min-mod task modification necessary    Comorbidities Affecting Occupational Performance: Presence of comorbidities impacting occupational performance    Modification or Assistance to Complete Evaluation  Min-Moderate modification of tasks or assist with assess necessary to complete eval    OT Frequency 2x / week    OT Duration 12 weeks   and PRN for follow along support and garment replacement   OT  Treatment/Interventions Self-care/ADL training;DME and/or AE instruction;Manual lymph drainage;Therapeutic activities;Compression bandaging;Therapeutic exercise;Scar mobilization;Coping strategies training;Manual Therapy;Patient/family education    Plan Complete Decongestive Therapy to RUE/RUQ to include manual lymphatic drainage a(MLD), skin care with low ph lotion/ castor oil , therapeutic lymphatic pumping exercise, compression wraps graduating to compression garments for daytime and device for HOS PRN. Will also incorporate kinesiotape, myofascial release PRN, scar massage PRN    Recommended Other Services fit with comfortable and effective gradient comptression garments. It;s likely Pt will need custom sleeve and glove, ccl 2 flat knit    Consulted and Agree with Plan of Care Patient             Patient will benefit from skilled therapeutic intervention in order to improve the following deficits and impairments:   Body Structure / Function / Physical Skills: ADL, Decreased knowledge of use of DME, Pain, Edema, UE functional use, IADL, ROM, Fascial restriction, Scar mobility, Skin integrity, Decreased knowledge of precautions       Visit Diagnosis: Postmastectomy lymphedema syndrome    Problem List Patient Active Problem List   Diagnosis Date Noted   Goals of care, counseling/discussion 05/17/2020   Breast cancer, right ([place]) 05/08/2020    Andrey Spearman, MS, OTR/L, CLT-LANA 05/09/21 1:05 PM   [place] MAIN [facility] SERVICES [address] [place], [place], [address] Phone: [phone]   Fax:  [phone]  Name: Kristy York MRN: [id number] Date of Birth: 10-07-1950

## 2021-05-14 ENCOUNTER — Ambulatory Visit: Payer: Medicare PPO | Admitting: Occupational Therapy

## 2021-05-14 ENCOUNTER — Other Ambulatory Visit: Payer: Self-pay

## 2021-05-14 DIAGNOSIS — I972 Postmastectomy lymphedema syndrome: Secondary | ICD-10-CM | POA: Diagnosis not present

## 2021-05-14 NOTE — Therapy (Signed)
[place] MAIN [facility] SERVICES [address] [place], [place], [address] Phone: [phone]   Fax:  [phone]  Occupational Therapy Treatment  Patient Details  Name: Kristy York MRN: [id number] Date of Birth: 1951-10-01 Referring Provider (OT): Delight Hoh, MD   Encounter Date: 05/14/2021   OT End of Session - 05/14/21 1051     Visit Number 28    Number of Visits 36    Date for OT Re-Evaluation 07/15/21    OT Start Time 0900    OT Stop Time 1005    OT Time Calculation (min) 65 min    Equipment Utilized During Treatment multiple examples of convoluted foam HOS devices to facilitate improved lymphatic flow during HOS    Activity Tolerance Patient tolerated treatment well;No increased pain    Behavior During Therapy WFL for tasks assessed/performed             Past Medical History:  Diagnosis Date   Anxiety    Arthritis    Diet-controlled diabetes mellitus ([place])    borderline   Fatigue    HLD (hyperlipidemia)    Invasive carcinoma of breast ([place]) 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; ER/PR (+), HER2/neu (-)   Metastasis to bone ([facility])    Breast primary   Murmur, cardiac    Peripheral edema    T wave inversion on electrocardiogram 05/17/2020   leads I, II, III, aVF, V3-V6    Past Surgical History:  Procedure Laterality Date   BREAST BIOPSY Right 05/02/2020   [place] Bx, Q clip, path pending   BREAST BIOPSY Right 05/02/2020   [place] Axilla Bx, path pending   CHOLECYSTECTOMY     COLONOSCOPY     LAPAROSCOPIC ABDOMINAL EXPLORATION     MASTECTOMY MODIFIED RADICAL Right 05/23/2020   Procedure: MASTECTOMY MODIFIED RADICAL, WITH EXCISION OF AXILLARY CONTENTS;  Surgeon: Robert Bellow, MD;  Location: [facility] ORS;  Service: General;  Laterality: Right;   PORTACATH PLACEMENT Left 05/23/2020   Procedure: INSERTION PORT-A-CATH;  Surgeon: Robert Bellow, MD;  Location: [facility] ORS;  Service: General;  Laterality: Left;    There were no vitals filed  for this visit.   Subjective Assessment - 05/14/21 1058     Subjective  Kristy York presents for OT visit 28/36 to address RUE/RUQ post-mastectomy lymphedema with custom compression arm sleeve in place. Pt denies arm, shoulder and chest wall pain. Pt has no new complaints. Custom compression glove has not yet arrived from manufacturer.    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 anxiety, arthritis, diet controlled diabetes mellitus, fatique, , heart murmer, periferal edema in feet and legs,    Limitations decreased RUE shoulder AROM, chronic RUE swelling and associated pain, fatigue    Repetition Increases Symptoms    Special Tests _ Stemmer R MPs    Pain Onset 1 to 4 weeks ago   3 weeks ago                         OT Treatments/Exercises (OP) - 05/14/21 1059       ADLs   ADL Education Given Yes      Manual Therapy   Manual Therapy Edema management;Manual Lymphatic Drainage (MLD);Compression Bandaging  Edema Management 3 layer compression wraps as established    Manual Lymphatic Drainage (MLD) MLD to RUE/RUQ with emphasis on upper arm today.Next visit will focus on trunk and chest wall directing lymphatic congestion alternatively to adjacent L axilla and R inguinal anastamosis.                    OT Education - 05/14/21 1059     Education Details Continued Pt/ CG edu for lymphedema self care  and home program throughout session. Topics include multilayer, gradient compression wrapping, simple self-MLD, therapeutic lymphatic pumping exercises, skin/nail care, risk reduction factors and LE precautions, compression garments/recommendations and wear and care schedule and compression garment donning / doffing using  assistive devices. All questions answered to the Pt's satisfaction, and Pt demonstrates understanding by report.    Person(s) Educated Child(ren);Patient   Kristy York   Methods Explanation;Demonstration;Handout    Comprehension Verbalized understanding;Need further instruction                 OT Long Term Goals - 04/10/21 1452       OT LONG TERM GOAL #1   Title Given this patient's risk-adjustment variables, her Intake Functional Status score of 61/100 on the FOTO tool, patient will experience at least an increase in function of 11 points for a score of 72/100.    Baseline 61/100    Time 12    Period Weeks    Status On-going    Target Date 07/10/21      OT LONG TERM GOAL #2   Title Pt will be able to apply multi-layer, short stretch compression wraps using correct gradient techniques with max caregiver assistance to return affected R limb to premorbid size and shape, to limit pain and infection risk, and to improve functional arm and hand use for ADLs.    Baseline dependent    Time 4    Period Days    Status Achieved      OT LONG TERM GOAL #3   Title With Max  CG Assist during visit intervals Pt  will achieve at least a 10%  limb volume reduction  in the R arm and hand during Intensive Phase CDT to achieve optimal limb volume reduction, to prevent re-accumulation of lymphatic congestion and progression of fibrosis, to limit infection risk, to improve functional arm/hand use, to improve functional performance of basic and instrumental ADLs, and to limit LE progression.    Baseline dependent    Time 12    Period Weeks    Status Achieved   02/22/21 Acieved with 25.3% limb volume reduction since 01/18/21. LVD now WNL at 3.06%, R (dominant/ Rx) > L     OT LONG TERM GOAL #4   Title With Max daily CG assistance Pt will achieve and sustain a least 85% compliance performing all daily LE self-care home program components throughout Intensive Phase CDT, including recommended skin care regime,  lymphatic pumping ther ex, 23/7 compression wraps and simple self-MLD, to ensure optimal limb volume reduction, to limit infection risk and to limit LE progression.    Baseline dependent    Time 12    Period Weeks    Status Achieved      OT LONG TERM GOAL #5   Title By DC from OT Pt will be able to don and doff appropriate daytime compression garments and HOS devices with Mod CG assistance to limit lymphatic re-accumulation and LE progression with before transitioning to self-management phase of CDT.  Baseline Max A    Time 12    Period Weeks    Status On-going    Target Date 07/10/21      OT LONG TERM GOAL #6   Title Pt will regain full RUE AROM  for flexion and abduction essential for optimal functional performance of basic and instrumental ADLs, productive activities,  and leisure pursuits by DC from OT.    Baseline Max A    Time 12    Period Weeks    Status Achieved                   Plan - 05/14/21 1052     Clinical Impression Statement Provided RUE/ RUQ MLD, skin care  and compression as establshed. Swelling is well managed between sessions and and Pt remains diligent with LE self care home program with assistance from her son when needed. She agrees that custom compression arm sleeve needs washing to bring it back into shape to improve effectiveness. Garment has not been laundered since issued. Applied gradient compression wraps so patient can wash s Limb volume reduction is stable with newly fitted custom compression arm sleeve after visit today.Pt agrees with plan to reduce OT frequency to 1 x weekly after this week while awaiting delivery of custom compression glove by DME provider. It is taking an inordinate amout of time to arrive from manufacturer.  RUE limb volume reduction is stable with newly fitted custom compression arm sleeve.    OT Occupational Profile and History Comprehensive Assessment- Review of records and extensive additional review of physical,  cognitive, psychosocial history related to current functional performance    Occupational performance deficits (Please refer to evaluation for details): ADL's;IADL's;Leisure;Work;Social Participation;Rest and Sleep    Body Structure / Function / Physical Skills ADL;Decreased knowledge of use of DME;Pain;Edema;UE functional use;IADL;ROM;Fascial restriction;Scar mobility;Skin integrity;Decreased knowledge of precautions    Rehab Potential Good    Clinical Decision Making Several treatment options, min-mod task modification necessary    Comorbidities Affecting Occupational Performance: Presence of comorbidities impacting occupational performance    Modification or Assistance to Complete Evaluation  Min-Moderate modification of tasks or assist with assess necessary to complete eval    OT Frequency 2x / week    OT Duration 12 weeks   and PRN for follow along support and garment replacement   OT Treatment/Interventions Self-care/ADL training;DME and/or AE instruction;Manual lymph drainage;Therapeutic activities;Compression bandaging;Therapeutic exercise;Scar mobilization;Coping strategies training;Manual Therapy;Patient/family education    Plan Complete Decongestive Therapy to RUE/RUQ to include manual lymphatic drainage a(MLD), skin care with low ph lotion/ castor oil , therapeutic lymphatic pumping exercise, compression wraps graduating to compression garments for daytime and device for HOS PRN. Will also incorporate kinesiotape, myofascial release PRN, scar massage PRN    Recommended Other Services fit with comfortable and effective gradient comptression garments. It;s likely Pt will need custom sleeve and glove, ccl 2 flat knit    Consulted and Agree with Plan of Care Patient             Patient will benefit from skilled therapeutic intervention in order to improve the following deficits and impairments:   Body Structure / Function / Physical Skills: ADL, Decreased knowledge of use of DME, Pain,  Edema, UE functional use, IADL, ROM, Fascial restriction, Scar mobility, Skin integrity, Decreased knowledge of precautions       Visit Diagnosis: Postmastectomy lymphedema syndrome    Problem List Patient Active Problem List   Diagnosis Date Noted   Goals of care,  counseling/discussion 05/17/2020   Breast cancer, right ([place]) 05/08/2020   Andrey Spearman, MS, OTR/L, [employer] 05/14/21 11:00 AM   [place] MAIN [facility] SERVICES [address] [place], [place], [address] Phone: [phone]   Fax:  [phone]  Name: Kristy York MRN: [id number] Date of Birth: 03/11/1951

## 2021-05-16 ENCOUNTER — Ambulatory Visit: Payer: Medicare PPO | Admitting: Occupational Therapy

## 2021-05-16 ENCOUNTER — Other Ambulatory Visit: Payer: Self-pay

## 2021-05-16 DIAGNOSIS — I972 Postmastectomy lymphedema syndrome: Secondary | ICD-10-CM | POA: Diagnosis not present

## 2021-05-16 NOTE — Therapy (Signed)
[place] MAIN [facility] SERVICES [address] [place], [place], [address] Phone: [phone]   Fax:  [phone]  Occupational Therapy Treatment  Patient Details  Name: Kristy York MRN: [id number] Date of Birth: April 10, 1951 Referring Provider (OT): Delight Hoh, MD   Encounter Date: 05/16/2021   OT End of Session - 05/16/21 0907     Visit Number 29    Number of Visits 36    Date for OT Re-Evaluation 07/15/21    OT Start Time 0900    OT Stop Time 1010    OT Time Calculation (min) 70 min    Equipment Utilized During Treatment multiple examples of convoluted foam HOS devices to facilitate improved lymphatic flow during HOS    Activity Tolerance Patient tolerated treatment well;No increased pain    Behavior During Therapy WFL for tasks assessed/performed             Past Medical History:  Diagnosis Date   Anxiety    Arthritis    Diet-controlled diabetes mellitus ([place])    borderline   Fatigue    HLD (hyperlipidemia)    Invasive carcinoma of breast ([place]) 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; ER/PR (+), HER2/neu (-)   Metastasis to bone ([facility])    Breast primary   Murmur, cardiac    Peripheral edema    T wave inversion on electrocardiogram 05/17/2020   leads I, II, III, aVF, V3-V6    Past Surgical History:  Procedure Laterality Date   BREAST BIOPSY Right 05/02/2020   [place] Bx, Q clip, path pending   BREAST BIOPSY Right 05/02/2020   [place] Axilla Bx, path pending   CHOLECYSTECTOMY     COLONOSCOPY     LAPAROSCOPIC ABDOMINAL EXPLORATION     MASTECTOMY MODIFIED RADICAL Right 05/23/2020   Procedure: MASTECTOMY MODIFIED RADICAL, WITH EXCISION OF AXILLARY CONTENTS;  Surgeon: Robert Bellow, MD;  Location: [facility] ORS;  Service: General;  Laterality: Right;   PORTACATH PLACEMENT Left 05/23/2020   Procedure: INSERTION PORT-A-CATH;  Surgeon: Robert Bellow, MD;  Location: [facility] ORS;  Service: General;  Laterality: Left;    There were no vitals filed  for this visit.   Subjective Assessment - 05/16/21 1019     Subjective  Kristy York presents for OT visit 29/36 to address RUE/RUQ post-mastectomy lymphedema with custom compression arm sleeve in place. Pt denies arm, shoulder and chest wall pain.    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 anxiety, arthritis, diet controlled diabetes mellitus, fatique, , heart murmer, periferal edema in feet and legs,    Limitations decreased RUE shoulder AROM, chronic RUE swelling and associated pain, fatigue    Repetition Increases Symptoms    Special Tests _ Stemmer R MPs    Pain Onset 1 to 4 weeks ago   3 weeks ago                LYMPHEDEMA/ONCOLOGY QUESTIONNAIRE - 05/16/21 1022       Right Upper Extremity Lymphedema   Other RUE limb volume 2280.2 ml    Other RLE limb volume is decreased by 30.2%  since commencing OT for CDT on 01/18/21. Limb volume differential (LVD)  is decreased from 14.5%, R>L, initially to 6.47%, L > R(dominant, Rx limb)  OT Treatments/Exercises (OP) - 05/16/21 1020       ADLs   ADL Education Given Yes      Manual Therapy   Manual Therapy Edema management;Manual Lymphatic Drainage (MLD);Compression Bandaging    Edema Management RUE comparative limb volumetrics    Soft tissue mobilization skin care during MLD with low ph castor oil to improve flexibility and hydration    Manual Lymphatic Drainage (MLD) MLD to RUE/RUQ with emphasis on upper arm today.Next visit will focus on trunk and chest wall directing lymphatic congestion alternatively to adjacent L axilla and R inguinal anastamosis.    Compression Bandaging Mod A to don compression arm sleeve after manual therapy                     OT Education - 05/16/21 1025     Education Details Reviewed results of comparative limb volumetrics. Reviewed progress towards all goals and plan going forward as Pt bbeins transition form Intensive Phase to self-management phase of CDT    Person(s) Educated Child(ren);Patient   [name]   Methods Explanation;Demonstration;Handout    Comprehension Verbalized understanding;Need further instruction                 OT Long Term Goals - 05/16/21 0928       OT LONG TERM GOAL #1   Title Given this patient's risk-adjustment variables, her Intake Functional Status score of 61/100 on the FOTO tool, patient will experience at least an increase in function of 11 points for a score of 72/100.    Baseline 61/100    Time 12    Period Weeks    Status Partially Met   Intake score = 61/100. 29th visit score 68/100. Increase of 7 points   Target Date 07/10/21      OT LONG TERM GOAL #2   Title Pt will be able to apply multi-layer, short stretch compression wraps using correct gradient techniques with max caregiver assistance to return affected R limb to premorbid size and shape, to limit pain and infection risk, and to improve functional arm and hand use for ADLs.    Baseline dependent    Time 4    Period Days    Status Achieved   Goal met and exceeded.     OT LONG TERM GOAL #3   Title With Max  CG Assist during visit intervals Pt  will achieve at least a 10%  limb volume reduction  in the R arm and hand during Intensive Phase CDT to achieve optimal limb volume reduction, to prevent re-accumulation of lymphatic congestion and progression of fibrosis, to limit infection risk, to improve functional arm/hand use, to improve functional performance of basic and instrumental ADLs, and to limit LE progression.    Baseline dependent    Time 12    Period Weeks    Status Achieved   02/22/21 Acieved with 25.3% limb volume reduction since 01/18/21. LVD now WNL at 3.06%, R (dominant/ Rx) > L.  29th visit 05/16/21 30.2% reduction. LVD 6.47%, L>R (dom, Rx). Goal met and exceeded.     OT LONG TERM GOAL #4   Title With Max daily CG assistance Pt will achieve and sustain a least 85% compliance performing all daily LE self-care home program components throughout Intensive Phase CDT, including recommended skin care regime, lymphatic pumping ther ex, 23/7 compression wraps and simple self-MLD, to ensure optimal limb volume reduction, to limit infection risk and to limit LE progression.    Baseline dependent  Time 12    Period Weeks    Status Achieved   Goal met and exceeded. 100% compliant with CG assist     OT LONG TERM GOAL #5   Title By DC from OT Pt will be able to don and doff appropriate daytime compression garments and HOS devices with Mod CG assistance to limit lymphatic re-accumulation and LE progression with before transitioning to self-management phase of CDT.    Baseline Max A    Time 12    Period Weeks    Status Achieved   Goal Met     OT LONG TERM GOAL #6   Title Pt will regain full RUE AROM  for flexion and abduction essential for optimal functional performance of basic and instrumental ADLs, productive activities,  and leisure pursuits by DC from OT.    Baseline Max A    Time 12    Period Weeks    Status Achieved                   Plan - 05/16/21 1027     Clinical Impression Statement Completed  review and assessment of progress towards all OT goals for lymphedema management . Pt has met all treatment goals, but has only partially met FOTO goal for incrase on functional scale by 11 points. To date Pt has increased her functional score on the FOTO tol by 7 points with her score today of 68/100, up from 61/100 initially. Limb volume reduction goal to daye is excellent with Pt meeting and achiving 10% goal with end result measuring 30.2% reduction. At present, the RUE teatment, and dominant, limb is lower in volume than the non-Rx LUE, which may be partially due  from decreased use , and thus decreased muscle mass, during Intensive Phase of treatment. Pt is wearing her cuystom arm sleeve daily as directed ad we will complete garment fitting as soon as garment is delivered by DME provider. Pt agree with plan to reduce OT treatment frequency from 2 x to 1 x weekly until fitting is complete. If patien'ts lymphedema symptoms remain stable at that juncture, we'll further reduce frequency to "follow -along" PRN status.    OT Occupational Profile and History Comprehensive Assessment- Review of records and extensive additional review of physical, cognitive, psychosocial history related to current functional performance    Occupational performance deficits (Please refer to evaluation for details): ADL's;IADL's;Leisure;Work;Social Participation;Rest and Sleep    Body Structure / Function / Physical Skills ADL;Decreased knowledge of use of DME;Pain;Edema;UE functional use;IADL;ROM;Fascial restriction;Scar mobility;Skin integrity;Decreased knowledge of precautions    Rehab Potential Good    Clinical Decision Making Several treatment options, min-mod task modification necessary    Comorbidities Affecting Occupational Performance: Presence of comorbidities impacting occupational performance    Modification or Assistance to Complete Evaluation  Min-Moderate modification of tasks or assist with assess necessary to complete eval    OT Frequency 2x / week    OT Duration 12 weeks   and PRN for follow along support and garment replacement   OT Treatment/Interventions Self-care/ADL training;DME and/or AE instruction;Manual lymph drainage;Therapeutic activities;Compression bandaging;Therapeutic exercise;Scar mobilization;Coping strategies training;Manual Therapy;Patient/family education    Plan Complete Decongestive Therapy to RUE/RUQ to include manual lymphatic drainage a(MLD), skin care with low ph lotion/ castor oil , therapeutic lymphatic pumping exercise, compression wraps  graduating to compression garments for daytime and device for HOS PRN. Will also incorporate kinesiotape, myofascial release PRN, scar massage PRN    Recommended Other Services fit with comfortable and  effective gradient comptression garments. It;s likely Pt will need custom sleeve and glove, ccl 2 flat knit    Consulted and Agree with Plan of Care Patient             Patient will benefit from skilled therapeutic intervention in order to improve the following deficits and impairments:   Body Structure / Function / Physical Skills: ADL, Decreased knowledge of use of DME, Pain, Edema, UE functional use, IADL, ROM, Fascial restriction, Scar mobility, Skin integrity, Decreased knowledge of precautions       Visit Diagnosis: Postmastectomy lymphedema syndrome    Problem List Patient Active Problem List   Diagnosis Date Noted   Goals of care, counseling/discussion 05/17/2020   Breast cancer, right ([place]) 05/08/2020    Andrey Spearman, MS, OTR/L, [employer] 05/16/21 10:35 AM   [place] [address] [place], [place], [address] Phone: [phone]   Fax:  [phone]  Name: Kristy York MRN: [id number] Date of Birth: 1951/01/08

## 2021-05-18 NOTE — Progress Notes (Signed)
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  INTERVAL HISTORY: Patient returns to clinic today for further evaluation and continuation of Zometa.  She currently feels well and is asymptomatic.  She is tolerating letrozole without significant side effects.  Her right arm lymphedema has significantly improved since evaluation in the lymphedema clinic and wearing a sleeve daily.  She has no neurologic complaints. She denies any recent fevers or illnesses.  She has a fair appetite, but denies weight loss.  She has no chest pain, shortness of breath, cough, or hemoptysis.  She denies any nausea, vomiting, constipation, or diarrhea.  She has no urinary complaints.  Patient feels at her baseline offers no specific complaints today.    REVIEW OF SYSTEMS:   Review of Systems  Constitutional: Negative.  Negative for fever, malaise/fatigue and weight loss.  Respiratory: Negative.  Negative for cough, hemoptysis and shortness of breath.   Cardiovascular: Negative.  Negative for chest pain and leg swelling.  Gastrointestinal: Negative.  Negative for abdominal pain.  Genitourinary: Negative.  Negative for dysuria.  Musculoskeletal: Negative.  Negative for back pain and joint pain.  Skin: Negative.  Negative for rash.  Neurological: Negative.  Negative for dizziness, focal weakness, weakness and headaches.  Psychiatric/Behavioral: Negative.  The patient is not nervous/anxious.    As per HPI. Otherwise, a complete review of systems is negative.  PAST MEDICAL HISTORY: Past Medical History:  Diagnosis Date   Anxiety    Arthritis    Diet-controlled diabetes  mellitus ([place])    borderline   Fatigue    HLD (hyperlipidemia)    Invasive carcinoma of breast ([place]) 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; ER/PR (+), HER2/neu (-)   Metastasis to bone ([facility])    Breast primary   Murmur, cardiac    Peripheral edema    T wave inversion on electrocardiogram 05/17/2020   leads I, II, III, aVF, V3-V6    PAST SURGICAL HISTORY: Past Surgical History:  Procedure Laterality Date   BREAST BIOPSY Right 05/02/2020   [place] Bx, Q clip, path pending   BREAST BIOPSY Right 05/02/2020   [place] Axilla Bx, path pending   CHOLECYSTECTOMY     COLONOSCOPY     LAPAROSCOPIC ABDOMINAL EXPLORATION     MASTECTOMY MODIFIED RADICAL Right 05/23/2020   Procedure: MASTECTOMY MODIFIED RADICAL, WITH EXCISION OF AXILLARY CONTENTS;  Surgeon: Robert Bellow, MD;  Location: [facility] ORS;  Service: General;  Laterality: Right;   PORTACATH PLACEMENT Left 05/23/2020   Procedure: INSERTION PORT-A-CATH;  Surgeon: Robert Bellow, MD;  Location: [facility] ORS;  Service: General;  Laterality: Left;    FAMILY HISTORY: Family History  Problem Relation Age of Onset   Breast cancer Sister     ADVANCED DIRECTIVES (Y/N):  N  HEALTH MAINTENANCE: Social History   Tobacco Use   Smoking status: Never   Smokeless tobacco: Never  Vaping Use   Vaping Use: Never used  Substance Use Topics   Alcohol use: Never   Drug use: Never     Colonoscopy:  PAP:  Bone density:  Lipid panel:  Allergies  Allergen Reactions   Nickel     Current Outpatient Medications  Medication Sig Dispense Refill   Calcium  Carbonate-Vitamin D 600-400 MG-UNIT tablet Take by mouth.     letrozole (FEMARA) 2.5 MG tablet TAKE 1 TABLET(2.5 MG) BY MOUTH DAILY 90 tablet 3   lidocaine-prilocaine (EMLA) cream Apply 1 application topically See admin instructions. APPLY A SMALL AMOUNT TO PORT SITE AT LEAST 1 HOUR PRIOR TO IT BEING ACCESSED THEN COVER WITH PLASTIC WRAP 30 g 3   lovastatin (MEVACOR) 40 MG tablet Take 40 mg by mouth daily.      metFORMIN (GLUCOPHAGE) 500 MG tablet Take 500 mg by mouth 2 (two) times daily with a meal.     metoprolol succinate (TOPROL-XL) 25 MG 24 hr tablet Take 25 mg by mouth daily.     No current facility-administered medications for this visit.    OBJECTIVE: Vitals:   05/21/21 1057 05/21/21 1058  BP:  (!) 141/80  Pulse: 75   Resp: 18   Temp: 98 F (36.7 C)   SpO2: 100%      Body mass index is 31.28 kg/m.    ECOG FS:0 - Asymptomatic  General: Well-developed, well-nourished, no acute distress. Eyes: Pink conjunctiva, anicteric sclera. HEENT: Normocephalic, moist mucous membranes. Lungs: No audible wheezing or coughing. Heart: Regular rate and rhythm. Abdomen: Soft, nontender, no obvious distention. Musculoskeletal: No edema, cyanosis, or clubbing. Neuro: Alert, answering all questions appropriately. Cranial nerves grossly intact. Skin: No rashes or petechiae noted. Psych: Normal affect.   LAB RESULTS:  Lab Results  Component Value Date   NA 138 05/21/2021   K 3.8 05/21/2021   CL 103 05/21/2021   CO2 27 05/21/2021   GLUCOSE 113 (H) 05/21/2021   BUN 13 05/21/2021   CREATININE 0.90 05/21/2021   CALCIUM 9.0 05/21/2021   GFRNONAA >60 05/21/2021   GFRAA >60 06/18/2020    No results found for: WBC, NEUTROABS, HGB, HCT, MCV, PLT   STUDIES: No results found.   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  PLAN:    1. 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  Patient underwent palliative mastectomy with bilateral lymph node sampling on May 23, 2020.  Patient's patient's CA 27-29 continues to fluctuate tween 51.0  and 68.0.  Results from today are 56.7.  Continue letrozole indefinitely.  We previously discussed the option of adding in chemotherapy with oral Ibrance or possibly IV treatment, but patient is not interested at this time.  Patient initiated monthly Zometa on June 18, 2020.  Proceed with Zometa today.  Patient can now be transition to treatment every 3 months.  Return to clinic in 6 weeks for laboratory work only and then in 3 months for repeat laboratory work, further evaluation, and continuation of Zometa and letrozole. 2.  Hypocalcemia: Resolved.  Proceed with treatment as above. 3.  Anxiety: Chronic and unchanged.  Continue Xanax as needed. 4.  Genetic testing: Patient has declined. 5.  Right arm lymphedema: Improved.  No evidence of DVT.  Continue sleeve and wrap as per lymphedema clinic instructions. 6.  Hypertension: Patient's blood pressure is mildly elevated, monitor.   Patient expressed understanding and was in agreement with this plan. She also understands that She can call clinic at any time with any questions, concerns, or complaints.   Cancer Staging Breast  cancer, right [facility]) Staging form: Breast, AJCC 8th Edition - Clinical: Stage IV (cT4b, cN2, cM1, ER+, PR+, HER2-) - Signed by Lloyd Huger, MD on 05/17/2020 Stage prefix: Initial diagnosis   Lloyd Huger, MD   05/22/2021 6:06 AM

## 2021-05-21 ENCOUNTER — Ambulatory Visit: Payer: Medicare PPO | Admitting: Occupational Therapy

## 2021-05-21 ENCOUNTER — Inpatient Hospital Stay (HOSPITAL_BASED_OUTPATIENT_CLINIC_OR_DEPARTMENT_OTHER): Payer: Medicare PPO | Admitting: Oncology

## 2021-05-21 ENCOUNTER — Other Ambulatory Visit: Payer: Self-pay

## 2021-05-21 ENCOUNTER — Inpatient Hospital Stay: Payer: Medicare PPO | Attending: Oncology

## 2021-05-21 ENCOUNTER — Inpatient Hospital Stay: Payer: Medicare PPO

## 2021-05-21 VITALS — BP 141/80 | HR 75 | Temp 98.0°F | Resp 18 | Wt 171.0 lb

## 2021-05-21 DIAGNOSIS — C7951 Secondary malignant neoplasm of bone: Secondary | ICD-10-CM | POA: Diagnosis present

## 2021-05-21 DIAGNOSIS — Z79811 Long term (current) use of aromatase inhibitors: Secondary | ICD-10-CM | POA: Insufficient documentation

## 2021-05-21 DIAGNOSIS — I972 Postmastectomy lymphedema syndrome: Secondary | ICD-10-CM | POA: Diagnosis not present

## 2021-05-21 DIAGNOSIS — Z95828 Presence of other vascular implants and grafts: Secondary | ICD-10-CM

## 2021-05-21 DIAGNOSIS — Z170 Estrogen receptor positive status [ER+]: Secondary | ICD-10-CM | POA: Diagnosis not present

## 2021-05-21 DIAGNOSIS — C50911 Malignant neoplasm of unspecified site of right female breast: Secondary | ICD-10-CM

## 2021-05-21 LAB — BASIC METABOLIC PANEL
Anion gap: 8 (ref 5–15)
BUN: 13 mg/dL (ref 8–23)
CO2: 27 mmol/L (ref 22–32)
Calcium: 9 mg/dL (ref 8.9–10.3)
Chloride: 103 mmol/L (ref 98–111)
Creatinine, Ser: 0.9 mg/dL (ref 0.44–1.00)
GFR, Estimated: >60 mL/min (ref >60)
Glucose, Bld: 113 mg/dL — ABNORMAL HIGH (ref 70–99)
Potassium: 3.8 mmol/L (ref 3.5–5.1)
Sodium: 138 mmol/L (ref 135–145)

## 2021-05-21 MED ORDER — HEPARIN SOD (PORK) LOCK FLUSH 100 UNIT/ML IV SOLN
500.0000 [IU] | Freq: Once | INTRAVENOUS | Status: AC
  Filled 2021-05-21: qty 5

## 2021-05-21 MED ORDER — SODIUM CHLORIDE 0.9% FLUSH
10.0000 mL | Freq: Once | INTRAVENOUS | Status: AC
  Administered 2021-05-21: 10 mL via INTRAVENOUS
  Filled 2021-05-21: qty 10

## 2021-05-21 MED ORDER — ZOLEDRONIC ACID 4 MG/100ML IV SOLN
4.0000 mg | Freq: Once | INTRAVENOUS | Status: AC
  Administered 2021-05-21: 4 mg via INTRAVENOUS
  Filled 2021-05-21: qty 100

## 2021-05-21 MED ORDER — HEPARIN SOD (PORK) LOCK FLUSH 100 UNIT/ML IV SOLN
INTRAVENOUS | Status: AC
  Administered 2021-05-21: 500 [IU] via INTRAVENOUS
  Filled 2021-05-21: qty 5

## 2021-05-21 MED ORDER — SODIUM CHLORIDE 0.9 % IV SOLN
Freq: Once | INTRAVENOUS | Status: AC
  Filled 2021-05-21: qty 250

## 2021-05-21 NOTE — Patient Instructions (Signed)

## 2021-05-21 NOTE — Progress Notes (Signed)
Patient has no questions or concerns at this time

## 2021-05-21 NOTE — Therapy (Signed)
[place] [facility] SERVICES [address] [place], [place], [address] Phone: [phone]   Fax:  [phone]  Occupational Therapy Treatment Note and Progress Report: WUX/LKG Lymphedema Care  Patient Details  Name: Kristy York MRN: [id number] Date of Birth: 1951-07-22 Referring Provider (OT): Delight Hoh, MD   Encounter Date: 05/21/2021   OT End of Session - 05/21/21 0947     Visit Number 30    Number of Visits 36    Date for OT Re-Evaluation 07/15/21    OT Start Time 0900    OT Stop Time 0930    OT Time Calculation (min) 30 min    Activity Tolerance Patient tolerated treatment well;No increased pain    Behavior During Therapy WFL for tasks assessed/performed             Past Medical History:  Diagnosis Date   Anxiety    Arthritis    Diet-controlled diabetes mellitus ([place])    borderline   Fatigue    HLD (hyperlipidemia)    Invasive carcinoma of breast ([place]) 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; ER/PR (+), HER2/neu (-)   Metastasis to bone ([facility])    Breast primary   Murmur, cardiac    Peripheral edema    T wave inversion on electrocardiogram 05/17/2020   leads I, II, III, aVF, V3-V6    Past Surgical History:  Procedure Laterality Date   BREAST BIOPSY Right 05/02/2020   [place] Bx, Q clip, path pending   BREAST BIOPSY Right 05/02/2020   [place] Axilla Bx, path pending   CHOLECYSTECTOMY     COLONOSCOPY     LAPAROSCOPIC ABDOMINAL EXPLORATION     MASTECTOMY MODIFIED RADICAL Right 05/23/2020   Procedure: MASTECTOMY MODIFIED RADICAL, WITH EXCISION OF AXILLARY CONTENTS;  Surgeon: Robert Bellow, MD;  Location: [facility] ORS;  Service: General;  Laterality: Right;   PORTACATH PLACEMENT Left 05/23/2020   Procedure: INSERTION PORT-A-CATH;  Surgeon: Robert Bellow, MD;  Location: [facility] ORS;  Service: General;  Laterality: Left;    There were no vitals filed for this visit.   Subjective Assessment - 05/21/21 1000     Subjective  Jakiera Ehler  presents for OT visit 30/36 to address RUE/RUQ post-mastectomy lymphedema with custom compression arm sleeve in place. Pt denies arm, shoulder and chest wall pain.    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 anxiety, arthritis, diet controlled diabetes mellitus, fatique, , heart murmer, periferal edema in feet and legs,    Limitations decreased RUE shoulder AROM, chronic RUE swelling and associated pain, fatigue    Repetition Increases Symptoms    Special Tests _ [place] R MPs    Pain Onset 1 to 4 weeks ago   3 weeks ago                         OT Treatments/Exercises (OP) - 05/21/21 1001       ADLs   ADL Education Given Yes      Manual Therapy   Manual Therapy Edema management;Manual Lymphatic Drainage (MLD);Compression Bandaging    Edema Management R custom ccl 1 compression glove fitting and initial assessment completed  OT Education - 05/21/21 1002     Education Details Reviewed results of comparative limb volumetrics. Reviewed progress towards all goals and plan going forward as Pt bbeins transition form Intensive Phase to self-management phase of CDT    Person(s) Educated Child(ren);Patient   [name]   Methods Explanation;Demonstration;Handout    Comprehension Verbalized understanding;Need further instruction                 OT Long Term Goals - 05/21/21 0958       OT LONG TERM GOAL #1   Title Given this patient's risk-adjustment variables, her Intake Functional Status score of 61/100 on the FOTO tool, patient will experience at least an increase in function of 11 points for a score of 72/100.    Baseline 61/100    Time 12    Period Weeks    Status On-going   Intake score =  61/100. 05/16/21: 68/100. 05/21/21: 60/100   Target Date 07/10/21      OT LONG TERM GOAL #2   Title Pt will be able to apply multi-layer, short stretch compression wraps using correct gradient techniques with max caregiver assistance to return affected R limb to premorbid size and shape, to limit pain and infection risk, and to improve functional arm and hand use for ADLs.    Baseline dependent    Time 4    Period Days    Status Achieved   Goal met and exceeded w/ modified Independence     OT LONG TERM GOAL #3   Title With Max  CG Assist during visit intervals Pt  will achieve at least a 10%  limb volume reduction  in the R arm and hand during Intensive Phase CDT to achieve optimal limb volume reduction, to prevent re-accumulation of lymphatic congestion and progression of fibrosis, to limit infection risk, to improve functional arm/hand use, to improve functional performance of basic and instrumental ADLs, and to limit LE progression.    Baseline dependent    Time 12    Period Weeks    Status Achieved   02/22/21 Acieved with 25.3% limb volume reduction since 01/18/21. LVD now WNL at 3.06%, R (dominant/ Rx) > L. 29th visit 05/16/21 30.2% reduction. LVD 6.47%, L>R (dom, Rx). Goal met and exceeded.     OT LONG TERM GOAL #4   Title With Max daily CG assistance Pt will achieve and sustain a least 85% compliance performing all daily LE self-care home program components throughout Intensive Phase CDT, including recommended skin care regime, lymphatic pumping ther ex, 23/7 compression wraps and simple self-MLD, to ensure optimal limb volume reduction, to limit infection risk and to limit LE progression.    Baseline dependent    Time 12    Period Weeks    Status Achieved   Goal met and exceeded. 100% compliant with CG assist     OT LONG TERM GOAL #5   Title By DC from OT Pt will be able to don and doff appropriate daytime compression garments and HOS devices with Mod CG assistance to limit lymphatic  re-accumulation and LE progression with before transitioning to self-management phase of CDT.    Baseline Max A    Time 12    Period Weeks    Status Achieved   Goal Met     OT LONG TERM GOAL #6   Title Pt will regain full RUE AROM  for flexion and abduction essential for optimal functional performance of basic and instrumental ADLs, productive activities,  and leisure pursuits by DC from OT.    Baseline Max A    Time 12    Period Weeks    Status Achieved                   Plan - 05/21/21 0950     Clinical Impression Statement Reviewed progress towards all OT goals for lymphedema management . Pt has met all treatment goals except goal to achieve 11 point increase on Functional Outcome measure (FOTO). Pt had a 7 ppont increase ( 68/100) between intake on 01/14/21 and measurement on 05/16/21. Her final score today measured 60/100, which reveals an overall decline from intake of 1 point.  Pt met and exceeded 10% limb volume reduction goal with 30.2% reduction for Intensive Phase CDT.  At present, the RUE teatment, and dominant, limb is lower in volume than the non-Rx LUE, which may be partially due from decreased use , and thus decreased muscle mass, during Intensive Phase of treatment. Custom compression glove fitting was completed today. Pt is fit with appropriate custom compression garments that are comfortable , and .she is able to don glove and sleeve with extra time (modified independent). She remains diligent with all self care home program compoonents, and her son remais a supportive caregiver when she needs assistance with LE self-care (compression wraps). Pt agree with plan to return to OT in 1 week to finalize compression garment assessment. If all is stable we'll reduce OT frequency to follow along status and PRN.    OT Occupational Profile and History Comprehensive Assessment- Review of records and extensive additional review of physical, cognitive, psychosocial history related to  current functional performance    Occupational performance deficits (Please refer to evaluation for details): ADL's;IADL's;Leisure;Work;Social Participation;Rest and Sleep    Body Structure / Function / Physical Skills ADL;Decreased knowledge of use of DME;Pain;Edema;UE functional use;IADL;ROM;Fascial restriction;Scar mobility;Skin integrity;Decreased knowledge of precautions    Rehab Potential Good    Clinical Decision Making Several treatment options, min-mod task modification necessary    Comorbidities Affecting Occupational Performance: Presence of comorbidities impacting occupational performance    Modification or Assistance to Complete Evaluation  Min-Moderate modification of tasks or assist with assess necessary to complete eval    OT Frequency 2x / week    OT Duration 12 weeks   and PRN for follow along support and garment replacement   OT Treatment/Interventions Self-care/ADL training;DME and/or AE instruction;Manual lymph drainage;Therapeutic activities;Compression bandaging;Therapeutic exercise;Scar mobilization;Coping strategies training;Manual Therapy;Patient/family education    Plan Complete Decongestive Therapy to RUE/RUQ to include manual lymphatic drainage a(MLD), skin care with low ph lotion/ castor oil , therapeutic lymphatic pumping exercise, compression wraps graduating to compression garments for daytime and device for HOS PRN. Will also incorporate kinesiotape, myofascial release PRN, scar massage PRN    Recommended Other Services fit with comfortable and effective gradient comptression garments. It;s likely Pt will need custom sleeve and glove, ccl 2 flat knit    Consulted and Agree with Plan of Care Patient             Patient will benefit from skilled therapeutic intervention in order to improve the following deficits and impairments:   Body Structure / Function / Physical Skills: ADL, Decreased knowledge of use of DME, Pain, Edema, UE functional use, IADL, ROM, Fascial  restriction, Scar mobility, Skin integrity, Decreased knowledge of precautions       Visit Diagnosis: Postmastectomy lymphedema syndrome    Problem List Patient Active Problem List   Diagnosis  Date Noted   Goals of care, counseling/discussion 05/17/2020   Breast cancer, right ([place]) 05/08/2020   Andrey Spearman, MS, OTR/L, [facility] 05/21/21 10:03 AM   [place] [facility] SERVICES [address] [place], [place], [address] Phone: [phone]   Fax:  [phone]  Name: Marc Sivertsen MRN: [id number] Date of Birth: [birth year]

## 2021-05-22 ENCOUNTER — Encounter: Payer: Self-pay | Admitting: Oncology

## 2021-05-22 LAB — CANCER ANTIGEN 27.29: CA 27.29: 56.7 U/mL — ABNORMAL HIGH (ref 0.0–38.6)

## 2021-05-23 ENCOUNTER — Encounter: Payer: Medicare PPO | Admitting: Occupational Therapy

## 2021-05-28 ENCOUNTER — Other Ambulatory Visit: Payer: Self-pay

## 2021-05-28 ENCOUNTER — Ambulatory Visit: Payer: Medicare PPO | Admitting: Occupational Therapy

## 2021-05-28 DIAGNOSIS — I972 Postmastectomy lymphedema syndrome: Secondary | ICD-10-CM | POA: Diagnosis not present

## 2021-05-28 NOTE — Therapy (Signed)
[place] MAIN [facility] SERVICES [address] [place], [place], [address] Phone: [phone]   Fax:  [phone]  Occupational Therapy Treatment  Patient Details  Name: Kristy York MRN: [id number] Date of Birth: [birth year] Referring Provider (OT): Delight Hoh, MD   Encounter Date: 05/28/2021   OT End of Session - 05/28/21 0913     Visit Number 31    Number of Visits 36    Date for OT Re-Evaluation 07/15/21    OT Start Time 0903    OT Stop Time 1004    OT Time Calculation (min) 61 min    Activity Tolerance Patient tolerated treatment well;No increased pain    Behavior During Therapy WFL for tasks assessed/performed             Past Medical History:  Diagnosis Date   Anxiety    Arthritis    Diet-controlled diabetes mellitus ([place])    borderline   Fatigue    HLD (hyperlipidemia)    Invasive carcinoma of breast ([place]) 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; ER/PR (+), HER2/neu (-)   Metastasis to bone (HCC)    Breast primary   Murmur, cardiac    Peripheral edema    T wave inversion on electrocardiogram 05/17/2020   leads I, II, III, aVF, V3-V6    Past Surgical History:  Procedure Laterality Date   BREAST BIOPSY Right 05/02/2020   [place] Bx, Q clip, path pending   BREAST BIOPSY Right 05/02/2020   [place] Axilla Bx, path pending   CHOLECYSTECTOMY     COLONOSCOPY     LAPAROSCOPIC ABDOMINAL EXPLORATION     MASTECTOMY MODIFIED RADICAL Right 05/23/2020   Procedure: MASTECTOMY MODIFIED RADICAL, WITH EXCISION OF AXILLARY CONTENTS;  Surgeon: Robert Bellow, MD;  Location: [facility] ORS;  Service: General;  Laterality: Right;   PORTACATH PLACEMENT Left 05/23/2020   Procedure: INSERTION PORT-A-CATH;  Surgeon: Robert Bellow, MD;  Location: [facility] ORS;  Service: General;  Laterality: Left;    There were no vitals filed for this visit.   Subjective Assessment - 05/28/21 1058     Subjective  Kristy York presents for OT visit 31/36 to address RUE/RUQ  post-mastectomy lymphedema with compression wraps in place. Pt brings clean compression sleeve and glove to don after manual therapy. Pt denies LE realted pain in the upper extremity and related quadrant.    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 anxiety, arthritis, diet controlled diabetes mellitus, fatique, , heart murmer, periferal edema in feet and legs,    Limitations decreased RUE shoulder AROM, chronic RUE swelling and associated pain, fatigue    Repetition Increases Symptoms    Special Tests _ Stemmer R MPs    Pain Onset 1 to 4 weeks ago   3 weeks ago                         OT Treatments/Exercises (OP) - 05/28/21 1059       ADLs   ADL Education Given Yes      Manual Therapy   Manual Therapy Edema management;Manual Lymphatic Drainage (MLD);Compression Bandaging    Edema Management R custom ccl 1 compression glove fitting and initial assessment completed    Soft tissue mobilization skin care  during MLD with low ph castor oil to improve flexibility and hydration    Manual Lymphatic Drainage (MLD) MLD to RUE/RUQ with emphasis on upper arm today.Next visit will focus on trunk and chest wall directing lymphatic congestion alternatively to adjacent L axilla and R inguinal anastamosis.    Compression Bandaging Mod A to don compression arm sleeve after manual therapy                    OT Education - 05/28/21 1100     Education Details Pt edu for simple self-MLD utilizing  contralateral AAA.    Person(s) Educated Child(ren);Patient   Kristy York   Methods Explanation;Demonstration;Handout    Comprehension Verbalized understanding;Need further instruction                 OT Long Term Goals - 05/21/21 0958        OT LONG TERM GOAL #1   Title Given this patient's risk-adjustment variables, her Intake Functional Status score of 61/100 on the FOTO tool, patient will experience at least an increase in function of 11 points for a score of 72/100.    Baseline 61/100    Time 12    Period Weeks    Status On-going   Intake score = 61/100. 05/16/21: 68/100. 05/21/21: 60/100   Target Date 07/10/21      OT LONG TERM GOAL #2   Title Pt will be able to apply multi-layer, short stretch compression wraps using correct gradient techniques with max caregiver assistance to return affected R limb to premorbid size and shape, to limit pain and infection risk, and to improve functional arm and hand use for ADLs.    Baseline dependent    Time 4    Period Days    Status Achieved   Goal met and exceeded w/ modified Independence     OT LONG TERM GOAL #3   Title With Max  CG Assist during visit intervals Pt  will achieve at least a 10%  limb volume reduction  in the R arm and hand during Intensive Phase CDT to achieve optimal limb volume reduction, to prevent re-accumulation of lymphatic congestion and progression of fibrosis, to limit infection risk, to improve functional arm/hand use, to improve functional performance of basic and instrumental ADLs, and to limit LE progression.    Baseline dependent    Time 12    Period Weeks    Status Achieved   02/22/21 Acieved with 25.3% limb volume reduction since 01/18/21. LVD now WNL at 3.06%, R (dominant/ Rx) > L. 29th visit 05/16/21 30.2% reduction. LVD 6.47%, L>R (dom, Rx). Goal met and exceeded.     OT LONG TERM GOAL #4   Title With Max daily CG assistance Pt will achieve and sustain a least 85% compliance performing all daily LE self-care home program components throughout Intensive Phase CDT, including recommended skin care regime, lymphatic pumping ther ex, 23/7 compression wraps and simple self-MLD, to ensure optimal limb volume reduction, to limit infection risk and to limit  LE progression.    Baseline dependent    Time 12    Period Weeks    Status Achieved   Goal met and exceeded. 100% compliant with CG assist     OT LONG TERM GOAL #5   Title By DC from OT Pt will be able to don and doff appropriate daytime compression garments and HOS devices with Mod CG assistance to limit lymphatic re-accumulation and LE progression with before transitioning to self-management  phase of CDT.    Baseline Max A    Time 12    Period Weeks    Status Achieved   Goal Met     OT LONG TERM GOAL #6   Title Pt will regain full RUE AROM  for flexion and abduction essential for optimal functional performance of basic and instrumental ADLs, productive activities,  and leisure pursuits by DC from OT.    Baseline Max A    Time 12    Period Weeks    Status Achieved                   Plan - 05/28/21 1056     Clinical Impression Statement Provided Pt edu sor simple self-MLD throughout manual therapy. Pt will practive during visit interval and we'll practice again next session. Limb swelling is well managed between sessions. Pt in agreement with plan to reduce Rx frequency to every other week going forward as she transitions into Self Management of CDT. Cont as per POC.    OT Occupational Profile and History Comprehensive Assessment- Review of records and extensive additional review of physical, cognitive, psychosocial history related to current functional performance    Occupational performance deficits (Please refer to evaluation for details): ADL's;IADL's;Leisure;Work;Social Participation;Rest and Sleep    Body Structure / Function / Physical Skills ADL;Decreased knowledge of use of DME;Pain;Edema;UE functional use;IADL;ROM;Fascial restriction;Scar mobility;Skin integrity;Decreased knowledge of precautions    Rehab Potential Good    Clinical Decision Making Several treatment options, min-mod task modification necessary    Comorbidities Affecting Occupational Performance:  Presence of comorbidities impacting occupational performance    Modification or Assistance to Complete Evaluation  Min-Moderate modification of tasks or assist with assess necessary to complete eval    OT Frequency 2x / week    OT Duration 12 weeks   and PRN for follow along support and garment replacement   OT Treatment/Interventions Self-care/ADL training;DME and/or AE instruction;Manual lymph drainage;Therapeutic activities;Compression bandaging;Therapeutic exercise;Scar mobilization;Coping strategies training;Manual Therapy;Patient/family education    Plan Complete Decongestive Therapy to RUE/RUQ to include manual lymphatic drainage a(MLD), skin care with low ph lotion/ castor oil , therapeutic lymphatic pumping exercise, compression wraps graduating to compression garments for daytime and device for HOS PRN. Will also incorporate kinesiotape, myofascial release PRN, scar massage PRN    Recommended Other Services fit with comfortable and effective gradient comptression garments. It;s likely Pt will need custom sleeve and glove, ccl 2 flat knit    Consulted and Agree with Plan of Care Patient             Patient will benefit from skilled therapeutic intervention in order to improve the following deficits and impairments:   Body Structure / Function / Physical Skills: ADL, Decreased knowledge of use of DME, Pain, Edema, UE functional use, IADL, ROM, Fascial restriction, Scar mobility, Skin integrity, Decreased knowledge of precautions       Visit Diagnosis: Postmastectomy lymphedema syndrome    Problem List Patient Active Problem List   Diagnosis Date Noted   Nonrheumatic mitral valve regurgitation 05/18/2020   Goals of care, counseling/discussion 05/17/2020   T wave inversion on electrocardiogram 05/17/2020   Breast cancer, right ([place]) 05/08/2020   History of right breast cancer 05/08/2020   Pure hypercholesterolemia 03/21/2020   Type 2 diabetes mellitus with hyperlipidemia  ([place]) 03/21/2020    Andrey Spearman, MS, OTR/L, CLT-LANA 05/28/21 11:02 AM   [place] MAIN [facility] SERVICES [address] [place], [place], [address]  Phone: [phone]   Fax:  [phone]  Name: Khristine Verno MRN: [id number] Date of Birth: Jul 23, 1951

## 2021-05-28 NOTE — Patient Instructions (Signed)

## 2021-05-30 ENCOUNTER — Encounter: Payer: Medicare PPO | Admitting: Occupational Therapy

## 2021-06-04 ENCOUNTER — Encounter: Payer: Medicare PPO | Admitting: Occupational Therapy

## 2021-06-06 ENCOUNTER — Encounter: Payer: Medicare PPO | Admitting: Occupational Therapy

## 2021-06-11 ENCOUNTER — Ambulatory Visit: Payer: Medicare PPO | Attending: Oncology | Admitting: Occupational Therapy

## 2021-06-11 ENCOUNTER — Other Ambulatory Visit: Payer: Self-pay

## 2021-06-11 DIAGNOSIS — I972 Postmastectomy lymphedema syndrome: Secondary | ICD-10-CM

## 2021-06-11 NOTE — Therapy (Signed)
Kristy York [address] [place], [place], [address] Phone: [phone]   Fax:  [phone]  Occupational Therapy Treatment  Patient Details  Name: Kristy York MRN: [id number] Date of Birth: 11-25-1950 Referring Provider (OT): Delight Hoh, MD   Encounter Date: 06/11/2021   OT End of Session - 06/11/21 1121     Visit Number 32    Number of Visits 36    Date for OT Re-Evaluation 07/15/21    OT Start Time 0900    OT Stop Time 1015    OT Time Calculation (min) 75 min    Activity Tolerance Patient tolerated treatment well;No increased pain    Behavior During Therapy WFL for tasks assessed/performed             Past Medical History:  Diagnosis Date   Anxiety    Arthritis    Diet-controlled diabetes mellitus ([place])    borderline   Fatigue    HLD (hyperlipidemia)    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; ER/PR (+), HER2/neu (-)   Metastasis to bone ([facility])    Breast primary   Murmur, cardiac    Peripheral edema    T wave inversion on electrocardiogram 05/17/2020   leads I, II, III, aVF, V3-V6    Past Surgical History:  Procedure Laterality Date   BREAST BIOPSY Right 05/02/2020   [place] Bx, Q clip, path pending   BREAST BIOPSY Right 05/02/2020   [place] Axilla Bx, path pending   CHOLECYSTECTOMY     COLONOSCOPY     LAPAROSCOPIC ABDOMINAL EXPLORATION     MASTECTOMY MODIFIED RADICAL Right 05/23/2020   Procedure: MASTECTOMY MODIFIED RADICAL, WITH EXCISION OF AXILLARY CONTENTS;  Surgeon: Robert Bellow, MD;  Location: [facility] ORS;  Service: General;  Laterality: Right;   PORTACATH PLACEMENT Left 05/23/2020   Procedure: INSERTION PORT-A-CATH;  Surgeon: Robert Bellow, MD;  Location: [facility] ORS;  Service: General;  Laterality: Left;    There were no vitals filed for this visit.   Subjective Assessment - 06/11/21 1140     Subjective  [name] presents for OT visit 32/36 to address RUE/RUQ  post-mastectomy lymphedema with compression wraps in place. Pt states,  "I washed my sleeve and glove yeasterday. I have thm in my bag. My son put the wraps on for me."Pt denies LE realted pain in the upper extremity and related quadrant. Pt denies any LE related concerns over the first longer 2 week visit interval and today.    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 anxiety, arthritis, diet controlled diabetes mellitus, fatique, , heart murmer, periferal edema in feet and legs,    Limitations decreased RUE shoulder AROM, chronic RUE swelling and associated pain, fatigue    Repetition Increases Symptoms    Special Tests _ Stemmer R MPs    Pain Onset 1 to 4 weeks ago   3 weeks ago                         OT Treatments/Exercises (OP) - 06/11/21 1142       ADLs   ADL Education Given Yes      Manual Therapy   Manual Therapy Edema management;Manual Lymphatic  Drainage (MLD)    Soft tissue mobilization skin care during MLD with low ph castor oil to improve flexibility and hydration    Manual Lymphatic Drainage (MLD) MLD to RUE/RUQ with emphasis on upper arm today.Next visit will focus on trunk and chest wall directing lymphatic congestion alternatively to adjacent L axilla and R inguinal anastamosis.    Compression Bandaging Mod A to don compression arm sleeve after manual therapy                    OT Education - 06/11/21 1143     Education Details Pt edu for simple self-MLD utilizing  contralateral AAA.    Person(s) Educated Child(ren);Patient   [name]   Methods Explanation;Demonstration;Handout    Comprehension Verbalized understanding;Need further instruction                 OT Long Term Goals - 05/21/21  0958       OT LONG TERM GOAL #1   Title Given this patient's risk-adjustment variables, her Intake Functional Status score of 61/100 on the FOTO tool, patient will experience at least an increase in function of 11 points for a score of 72/100.    Baseline 61/100    Time 12    Period Weeks    Status On-going   Intake score = 61/100. 05/16/21: 68/100. 05/21/21: 60/100   Target Date 07/10/21      OT LONG TERM GOAL #2   Title Pt will be able to apply multi-layer, short stretch compression wraps using correct gradient techniques with max caregiver assistance to return affected R limb to premorbid size and shape, to limit pain and infection risk, and to improve functional arm and hand use for ADLs.    Baseline dependent    Time 4    Period Days    Status Achieved   Goal met and exceeded w/ modified Independence     OT LONG TERM GOAL #3   Title With Max  CG Assist during visit intervals Pt  will achieve at least a 10%  limb volume reduction  in the R arm and hand during Intensive Phase CDT to achieve optimal limb volume reduction, to prevent re-accumulation of lymphatic congestion and progression of fibrosis, to limit infection risk, to improve functional arm/hand use, to improve functional performance of basic and instrumental ADLs, and to limit LE progression.    Baseline dependent    Time 12    Period Weeks    Status Achieved   02/22/21 Acieved with 25.3% limb volume reduction since 01/18/21. LVD now WNL at 3.06%, R (dominant/ Rx) > L. 29th visit 05/16/21 30.2% reduction. LVD 6.47%, L>R (dom, Rx). Goal met and exceeded.     OT LONG TERM GOAL #4   Title With Max daily CG assistance Pt will achieve and sustain a least 85% compliance performing all daily LE self-care home program components throughout Intensive Phase CDT, including recommended skin care regime, lymphatic pumping ther ex, 23/7 compression wraps and simple self-MLD, to ensure optimal limb volume reduction, to limit infection risk and to  limit LE progression.    Baseline dependent    Time 12    Period Weeks    Status Achieved   Goal met and exceeded. 100% compliant with CG assist     OT LONG TERM GOAL #5   Title By DC from OT Pt will be able to don and doff appropriate daytime compression garments and HOS devices with Mod CG assistance to limit  lymphatic re-accumulation and LE progression with before transitioning to self-management phase of CDT.    Baseline Max A    Time 12    Period Weeks    Status Achieved   Goal Met     OT LONG TERM GOAL #6   Title Pt will regain full RUE AROM  for flexion and abduction essential for optimal functional performance of basic and instrumental ADLs, productive activities,  and leisure pursuits by DC from OT.    Baseline Max A    Time 12    Period Weeks    Status Achieved                   Plan - 06/11/21 1122     Clinical Impression Statement Pt tolerated RUE/RUQ MLD utilizing contralateral AAA watershed in supine with head elevated ~ 30 degtrees without increased pain. Mastectomy scar is easily mobilized allowing for smooth mobilization across watersheds.  Increased tissue density persists at R  volar forarm in region of flexor ulnaris mm belly. Does not respond with softening after lengthy , ~ 20 minute, deep tissue technique. Cntinue to mionitor this area for worsening fibrosis and progression. Continued reviewing and teaching sinple self- MLD throughout manual therapy. Pt in agreement with plan to continue OT q 2 weeks during the month of September as she transitions to Self Management of CDT.    OT Occupational Profile and History Comprehensive Assessment- Review of records and extensive additional review of physical, cognitive, psychosocial history related to current functional performance    Occupational performance deficits (Please refer to evaluation for details): ADL's;IADL's;Leisure;Work;Social Participation;Rest and Sleep    Body Structure / Function / Physical Skills  ADL;Decreased knowledge of use of DME;Pain;Edema;UE functional use;IADL;ROM;Fascial restriction;Scar mobility;Skin integrity;Decreased knowledge of precautions    Rehab Potential Good    Clinical Decision Making Several treatment options, min-mod task modification necessary    Comorbidities Affecting Occupational Performance: Presence of comorbidities impacting occupational performance    Modification or Assistance to Complete Evaluation  Min-Moderate modification of tasks or assist with assess necessary to complete eval    OT Frequency 2x / week    OT Duration 12 weeks   and PRN for follow along support and garment replacement   OT Treatment/Interventions Self-care/ADL training;DME and/or AE instruction;Manual lymph drainage;Therapeutic activities;Compression bandaging;Therapeutic exercise;Scar mobilization;Coping strategies training;Manual Therapy;Patient/family education    Plan Complete Decongestive Therapy to RUE/RUQ to include manual lymphatic drainage a(MLD), skin care with low ph lotion/ castor oil , therapeutic lymphatic pumping exercise, compression wraps graduating to compression garments for daytime and device for HOS PRN. Will also incorporate kinesiotape, myofascial release PRN, scar massage PRN    Recommended Other [place] fit with comfortable and effective gradient comptression garments. It;s likely Pt will need custom sleeve and glove, ccl 2 flat knit    Consulted and Agree with Plan of Care Patient             Patient will benefit from skilled therapeutic intervention in order to improve the following deficits and impairments:   Body Structure / Function / Physical Skills: ADL, Decreased knowledge of use of DME, Pain, Edema, UE functional use, IADL, ROM, Fascial restriction, Scar mobility, Skin integrity, Decreased knowledge of precautions       Visit Diagnosis: Postmastectomy lymphedema syndrome    Problem List Patient Active Problem List   Diagnosis Date Noted    Nonrheumatic mitral valve regurgitation 05/18/2020   Goals of care, counseling/discussion 05/17/2020   T wave inversion on electrocardiogram  05/17/2020   Breast cancer, right ([place]) 05/08/2020   History of right breast cancer 05/08/2020   Pure hypercholesterolemia 03/21/2020   Type 2 diabetes mellitus with hyperlipidemia ([place]) 03/21/2020    Andrey Spearman, MS, OTR/L, [facility] 06/11/21 11:45 AM   [place] MAIN [facility] [place], [place], [address] Phone: [phone]   Fax:  [phone]  Name: Kristy York MRN: [id number] Date of Birth: May 14, 1951

## 2021-06-13 ENCOUNTER — Encounter: Payer: Medicare PPO | Admitting: Occupational Therapy

## 2021-06-20 ENCOUNTER — Encounter: Payer: Medicare PPO | Admitting: Occupational Therapy

## 2021-06-25 ENCOUNTER — Other Ambulatory Visit: Payer: Self-pay

## 2021-06-25 ENCOUNTER — Ambulatory Visit: Payer: Medicare PPO | Admitting: Occupational Therapy

## 2021-06-25 DIAGNOSIS — I972 Postmastectomy lymphedema syndrome: Secondary | ICD-10-CM

## 2021-06-25 NOTE — Therapy (Signed)
[place] MAIN [facility] SERVICES [address] [place], [place], [address] Phone: [phone]   Fax:  [phone]  Occupational Therapy Treatment  Patient Details  Name: Kristy York MRN: [id number] Date of Birth: [birth year] Referring Provider (OT): Delight Hoh, MD   Encounter Date: 06/25/2021   OT End of Session - 06/25/21 0917     Visit Number 33    Number of Visits 36    Date for OT Re-Evaluation 07/15/21    OT Start Time 0910    OT Stop Time 1010    OT Time Calculation (min) 60 min    Activity Tolerance Patient tolerated treatment well;No increased pain    Behavior During Therapy WFL for tasks assessed/performed             Past Medical History:  Diagnosis Date   Anxiety    Arthritis    Diet-controlled diabetes mellitus ([place])    borderline   Fatigue    HLD (hyperlipidemia)    Invasive carcinoma of breast ([place]) 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; ER/PR (+), HER2/neu (-)   Metastasis to bone (HCC)    Breast primary   Murmur, cardiac    Peripheral edema    T wave inversion on electrocardiogram 05/17/2020   leads I, II, III, aVF, V3-V6    Past Surgical History:  Procedure Laterality Date   BREAST BIOPSY Right 05/02/2020   [place] Bx, Q clip, path pending   BREAST BIOPSY Right 05/02/2020   [place] Axilla Bx, path pending   CHOLECYSTECTOMY     COLONOSCOPY     LAPAROSCOPIC ABDOMINAL EXPLORATION     MASTECTOMY MODIFIED RADICAL Right 05/23/2020   Procedure: MASTECTOMY MODIFIED RADICAL, WITH EXCISION OF AXILLARY CONTENTS;  Surgeon: Robert Bellow, MD;  Location: [facility] ORS;  Service: General;  Laterality: Right;   PORTACATH PLACEMENT Left 05/23/2020   Procedure: INSERTION PORT-A-CATH;  Surgeon: Robert Bellow, MD;  Location: [facility] ORS;  Service: General;  Laterality: Left;    There were no vitals filed for this visit.   Subjective Assessment - 06/25/21 1146     Subjective  Kristy York presents for OT visit 33/36 to address RUE/RUQ  post-mastectomy lymphedema with compression wraps in place. Pt states, "My arm seems to be good. I can do pretty much whatever. 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 anxiety, arthritis, diet controlled diabetes mellitus, fatique, , heart murmer, periferal edema in feet and legs,    Limitations decreased RUE shoulder AROM, chronic RUE swelling and associated pain, fatigue    Repetition Increases Symptoms    Special Tests _ Stemmer R MPs    Pain Onset 1 to 4 weeks ago   3 weeks ago                         OT Treatments/Exercises (OP) - 06/25/21 1147       ADLs   ADL Education Given Yes      Manual Therapy   Manual Therapy Edema management;Manual Lymphatic Drainage (MLD)    Soft tissue mobilization skin care during MLD with low ph castor oil to improve flexibility and hydration    Manual Lymphatic Drainage (MLD)  MLD to RUE/RUQ with emphasis on upper arm today.Next visit will focus on trunk and chest wall directing lymphatic congestion alternatively to adjacent L axilla and R inguinal anastamosis.    Compression Bandaging Mod A to don compression arm sleeve after manual therapy                    OT Education - 06/25/21 1148     Education Details Cont Pt edu for simple self-MLD utilizing  contralateral AAA.Reviewed all Rx goals, including FOTO outcome measured today. Reviewed POC and discussed plan for support  PRN over time.    Person(s) Educated Child(ren);Patient   Kristy York   Methods Explanation;Demonstration;Handout    Comprehension Verbalized understanding;Need further instruction                 OT Long Term Goals - 06/25/21 1124       OT LONG TERM GOAL #1    Title Given this patient's risk-adjustment variables, her Intake Functional Status score of 61/100 on the FOTO tool, patient will experience at least an increase in function of 11 points for a score of 72/100.    Baseline 61/100    Time 12    Period Weeks    Status Achieved   Intake score = 61/100. 05/16/21: 68/100. 05/21/21: 60/100 06/25/21: GOAL MET with 75/100, a 14 point increase in function compared with intake.     OT LONG TERM GOAL #2   Title Pt will be able to apply multi-layer, short stretch compression wraps using correct gradient techniques with max caregiver assistance to return affected R limb to premorbid size and shape, to limit pain and infection risk, and to improve functional arm and hand use for ADLs.    Baseline dependent    Time 4    Period Days    Status Achieved   Goal met and exceeded w/ modified Independence     OT LONG TERM GOAL #3   Title With Max  CG Assist during visit intervals Pt  will achieve at least a 10%  limb volume reduction  in the R arm and hand during Intensive Phase CDT to achieve optimal limb volume reduction, to prevent re-accumulation of lymphatic congestion and progression of fibrosis, to limit infection risk, to improve functional arm/hand use, to improve functional performance of basic and instrumental ADLs, and to limit LE progression.    Baseline dependent    Time 12    Period Weeks    Status Achieved   02/22/21 Acieved with 25.3% limb volume reduction since 01/18/21. LVD now WNL at 3.06%, R (dominant/ Rx) > L. 29th visit 05/16/21 30.2% reduction. LVD 6.47%, L>R (dom, Rx). Goal met and exceeded.     OT LONG TERM GOAL #4   Title With Max daily CG assistance Pt will achieve and sustain a least 85% compliance performing all daily LE self-care home program components throughout Intensive Phase CDT, including recommended skin care regime, lymphatic pumping ther ex, 23/7 compression wraps and simple self-MLD, to ensure optimal limb volume reduction, to limit  infection risk and to limit LE progression.    Baseline dependent    Time 12    Period Weeks    Status Achieved   Goal met and exceeded. 100% compliant with CG assist     OT LONG TERM GOAL #5   Title By DC from OT Pt will be able to don and doff appropriate daytime compression garments and HOS devices with Mod CG assistance to limit lymphatic  re-accumulation and LE progression with before transitioning to self-management phase of CDT.    Baseline Max A    Time 12    Period Weeks    Status Achieved   Goal Met     OT LONG TERM GOAL #6   Title Pt will regain full RUE AROM  for flexion and abduction essential for optimal functional performance of basic and instrumental ADLs, productive activities,  and leisure pursuits by DC from OT.    Baseline Max A    Time 12    Period Weeks    Status Achieved                   Plan - 06/25/21 0925     Clinical Impression Statement Provided RUE /RUQ MLD in supine as established without increased pain. FOTO assessment, repeated today (75/100), reveals a 14 point increase in functional status compared with intake score (61/100) Goal met! Limb volume is well managed with slight tissue fibrosis remaining along wrist flexor muscle belly at medial  L arm. Pt is 100% compliant with custom compression garments, which she is now able to don and doff independently in a timely way , even without assistive device/s. Pt has met all long term OT treatmenbt goals and is fully transitioned into the self-management phase of CDT. She agrees with plan to follow up in one month and then consider longer interval. OT is happy to support this patient PRN ongoing to limit LE progression and ensure optimal functional outcomes. We'll see Kimanh back in 4 weeks to check limb volumes and check garment condition.    OT Occupational Profile and History Comprehensive Assessment- Review of records and extensive additional review of physical, cognitive, psychosocial history related  to current functional performance    Occupational performance deficits (Please refer to evaluation for details): ADL's;IADL's;Leisure;Work;Social Participation;Rest and Sleep    Body Structure / Function / Physical Skills ADL;Decreased knowledge of use of DME;Pain;Edema;UE functional use;IADL;ROM;Fascial restriction;Scar mobility;Skin integrity;Decreased knowledge of precautions    Rehab Potential Good    Clinical Decision Making Several treatment options, min-mod task modification necessary    Comorbidities Affecting Occupational Performance: Presence of comorbidities impacting occupational performance    Modification or Assistance to Complete Evaluation  Min-Moderate modification of tasks or assist with assess necessary to complete eval    OT Frequency 2x / week    OT Duration 12 weeks   and PRN for follow along support and garment replacement   OT Treatment/Interventions Self-care/ADL training;DME and/or AE instruction;Manual lymph drainage;Therapeutic activities;Compression bandaging;Therapeutic exercise;Scar mobilization;Coping strategies training;Manual Therapy;Patient/family education    Plan Complete Decongestive Therapy to RUE/RUQ to include manual lymphatic drainage a(MLD), skin care with low ph lotion/ castor oil , therapeutic lymphatic pumping exercise, compression wraps graduating to compression garments for daytime and device for HOS PRN. Will also incorporate kinesiotape, myofascial release PRN, scar massage PRN    Recommended Other Services fit with comfortable and effective gradient comptression garments. It;s likely Pt will need custom sleeve and glove, ccl 2 flat knit    Consulted and Agree with Plan of Care Patient             Patient will benefit from skilled therapeutic intervention in order to improve the following deficits and impairments:   Body Structure / Function / Physical Skills: ADL, Decreased knowledge of use of DME, Pain, Edema, UE functional use, IADL, ROM,  Fascial restriction, Scar mobility, Skin integrity, Decreased knowledge of precautions       Visit  Diagnosis: Postmastectomy lymphedema syndrome    Problem List Patient Active Problem List   Diagnosis Date Noted   Nonrheumatic mitral valve regurgitation 05/18/2020   Goals of care, counseling/discussion 05/17/2020   T wave inversion on electrocardiogram 05/17/2020   Breast cancer, right ([place]) 05/08/2020   History of right breast cancer 05/08/2020   Pure hypercholesterolemia 03/21/2020   Type 2 diabetes mellitus with hyperlipidemia ([place]) 03/21/2020    Andrey Spearman, MS, OTR/L, [facility] 06/25/21 11:52 AM  [place] MAIN [facility] SERVICES [place], [place], [address] Phone: [phone]   Fax:  [phone]  Name: Kristy York MRN: [id number] Date of Birth: [birth year]

## 2021-07-01 ENCOUNTER — Other Ambulatory Visit: Payer: Self-pay

## 2021-07-01 DIAGNOSIS — C50911 Malignant neoplasm of unspecified site of right female breast: Secondary | ICD-10-CM

## 2021-07-02 ENCOUNTER — Encounter: Payer: Medicare PPO | Admitting: Occupational Therapy

## 2021-07-02 ENCOUNTER — Inpatient Hospital Stay: Payer: Medicare PPO | Attending: Oncology

## 2021-07-02 ENCOUNTER — Other Ambulatory Visit: Payer: Self-pay

## 2021-07-02 DIAGNOSIS — Z170 Estrogen receptor positive status [ER+]: Secondary | ICD-10-CM | POA: Diagnosis not present

## 2021-07-02 DIAGNOSIS — C50911 Malignant neoplasm of unspecified site of right female breast: Secondary | ICD-10-CM | POA: Diagnosis not present

## 2021-07-02 LAB — BASIC METABOLIC PANEL
Anion gap: 12 (ref 5–15)
BUN: 15 mg/dL (ref 8–23)
CO2: 25 mmol/L (ref 22–32)
Calcium: 9.5 mg/dL (ref 8.9–10.3)
Chloride: 101 mmol/L (ref 98–111)
Creatinine, Ser: 1.01 mg/dL — ABNORMAL HIGH (ref 0.44–1.00)
GFR, Estimated: 59 mL/min — ABNORMAL LOW (ref >60)
Glucose, Bld: 107 mg/dL — ABNORMAL HIGH (ref 70–99)
Potassium: 3.7 mmol/L (ref 3.5–5.1)
Sodium: 138 mmol/L (ref 135–145)

## 2021-07-03 LAB — CANCER ANTIGEN 27.29: CA 27.29: 44.4 U/mL — ABNORMAL HIGH (ref 0.0–38.6)

## 2021-07-09 ENCOUNTER — Ambulatory Visit: Payer: Medicare PPO | Admitting: Occupational Therapy

## 2021-07-16 ENCOUNTER — Encounter: Payer: Medicare PPO | Admitting: Occupational Therapy

## 2021-07-23 ENCOUNTER — Other Ambulatory Visit: Payer: Self-pay

## 2021-07-23 ENCOUNTER — Ambulatory Visit: Payer: Medicare PPO | Attending: Oncology | Admitting: Occupational Therapy

## 2021-07-23 DIAGNOSIS — I972 Postmastectomy lymphedema syndrome: Secondary | ICD-10-CM | POA: Insufficient documentation

## 2021-07-23 NOTE — Patient Instructions (Signed)

## 2021-07-23 NOTE — Therapy (Signed)
[place] MAIN [facility] SERVICES [address] [place], [place], [address] Phone: [phone]   Fax:  [phone]  Occupational Therapy Treatment  Patient Details  Name: Kristy York MRN: [id number] Date of Birth: 03/10/1951 Referring Provider (OT): Delight Hoh, MD   Encounter Date: 07/23/2021   OT End of Session - 07/23/21 0900     Visit Number 35    Number of Visits 36    Date for OT Re-Evaluation 10/21/21    OT Start Time 0900    OT Stop Time 1000    OT Time Calculation (min) 60 min    Activity Tolerance Patient tolerated treatment well;No increased pain    Behavior During Therapy WFL for tasks assessed/performed             Past Medical History:  Diagnosis Date   Anxiety    Arthritis    Diet-controlled diabetes mellitus ([place])    borderline   Fatigue    HLD (hyperlipidemia)    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; ER/PR (+), HER2/neu (-)   Metastasis to bone ([facility])    Breast primary   Murmur, cardiac    Peripheral edema    T wave inversion on electrocardiogram 05/17/2020   leads I, II, III, aVF, V3-V6    Past Surgical History:  Procedure Laterality Date   BREAST BIOPSY Right 05/02/2020   [place] Bx, Q clip, path pending   BREAST BIOPSY Right 05/02/2020   [place] Axilla Bx, path pending   CHOLECYSTECTOMY     COLONOSCOPY     LAPAROSCOPIC ABDOMINAL EXPLORATION     MASTECTOMY MODIFIED RADICAL Right 05/23/2020   Procedure: MASTECTOMY MODIFIED RADICAL, WITH EXCISION OF AXILLARY CONTENTS;  Surgeon: Robert Bellow, MD;  Location: [facility] ORS;  Service: General;  Laterality: Right;   PORTACATH PLACEMENT Left 05/23/2020   Procedure: INSERTION PORT-A-CATH;  Surgeon: Robert Bellow, MD;  Location: [facility] ORS;  Service: General;  Laterality: Left;    There were no vitals filed for this visit.   Subjective Assessment - 07/23/21 1015     Subjective  Kristy York presents for OT visit 34/36 to address RUE/RUQ  post-mastectomy lymphedema with compression wraps in place. Pt was last seen for lymphedema care on 06/25/21. She denies pain in the affected R arm and RUQ. She reports she remains diligent with LE self care daily as instructed. She has no complaints today.    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 anxiety, arthritis, diet controlled diabetes mellitus, fatique, , heart murmer, periferal edema in feet and legs,    Limitations decreased RUE shoulder AROM, chronic RUE swelling and associated pain, fatigue    Repetition Increases Symptoms    Special Tests _ Stemmer R MPs    Pain Onset 1 to 4 weeks ago   3 weeks ago                         OT Treatments/Exercises (OP) - 07/23/21 1016       ADLs   ADL Education Given Yes      Manual Therapy   Manual Therapy Edema management;Manual Lymphatic Drainage (MLD)    Edema Management Custom sleeve and glove in good condition despite  signs of frequent wear.    Soft tissue mobilization skin care during MLD with low ph castor oil to improve flexibility and hydration    Manual Lymphatic Drainage (MLD) MLD to RUE/RUQ with emphasis on upper arm today.Next visit will focus on trunk and chest wall directing lymphatic congestion alternatively to adjacent L axilla and R inguinal anastamosis.    Compression Bandaging Mod A to don compression arm sleeve after manual therapy due to time constraints.                    OT Education - 07/23/21 1018     Education Details Continued Pt/ CG edu for lymphedema self care  and home program throughout session. Topics include multilayer, gradient compression wrapping, simple self-MLD, therapeutic lymphatic pumping exercises, skin/nail care, risk reduction  factors and LE precautions, compression garments/recommendations and wear and care schedule and compression garment donning / doffing using assistive devices. All questions answered to the Pt's satisfaction, and Pt demonstrates understanding by report.    Person(s) Educated Child(ren);Patient   Kristy York   Methods Explanation;Demonstration;Handout    Comprehension Verbalized understanding;Returned demonstration                 OT Long Term Goals - 07/23/21 1007       OT LONG TERM GOAL #1   Title Given this patient's risk-adjustment variables, her Intake Functional Status score of 61/100 on the FOTO tool, patient will experience at least an increase in function of 11 points for a score of 72/100.    Baseline 61/100    Time 12    Period Weeks    Status Achieved   Intake score = 61/100. 05/16/21: 68/100. 05/21/21: 60/100 06/25/21: GOAL MET with 75/100, a 14 point increase in function compared with intake.     OT LONG TERM GOAL #2   Title Pt will be able to apply multi-layer, short stretch compression wraps using correct gradient techniques with max caregiver assistance to return affected R limb to premorbid size and shape, to limit pain and infection risk, and to improve functional arm and hand use for ADLs.    Baseline dependent    Time 4    Period Days    Status Achieved   Goal met and exceeded w/ modified Independence     OT LONG TERM GOAL #3   Title With Max  CG Assist during visit intervals Pt  will achieve at least a 10%  limb volume reduction  in the R arm and hand during Intensive Phase CDT to achieve optimal limb volume reduction, to prevent re-accumulation of lymphatic congestion and progression of fibrosis, to limit infection risk, to improve functional arm/hand use, to improve functional performance of basic and instrumental ADLs, and to limit LE progression.    Baseline dependent    Time 12    Period Weeks    Status Achieved   02/22/21 Acieved with 25.3% limb volume reduction  since 01/18/21. LVD now WNL at 3.06%, R (dominant/ Rx) > L. 29th visit 05/16/21 30.2% reduction. LVD 6.47%, L>R (dom, Rx). Goal met and exceeded.     OT LONG TERM GOAL #4   Title With Max daily CG assistance Pt will achieve and sustain a least 85% compliance performing all daily LE self-care home program components throughout Intensive Phase CDT, including recommended skin care regime, lymphatic pumping ther ex, 23/7 compression wraps and simple self-MLD, to ensure optimal limb volume reduction, to limit infection risk and to limit LE progression.  Baseline dependent    Time 12    Period Weeks    Status Achieved   Goal met and exceeded. 100% compliant with CG assist     OT LONG TERM GOAL #5   Title By DC from OT Pt will be able to don and doff appropriate daytime compression garments and HOS devices with Mod CG assistance to limit lymphatic re-accumulation and LE progression with before transitioning to self-management phase of CDT.    Baseline Max A    Time 12    Period Weeks    Status Achieved   Goal Met     OT LONG TERM GOAL #6   Title Pt will regain full RUE AROM  for flexion and abduction essential for optimal functional performance of basic and instrumental ADLs, productive activities,  and leisure pursuits by DC from OT.    Baseline Max A    Time 12    Period Weeks    Status Achieved                   Plan - 07/23/21 1009     Clinical Impression Statement Ms Girardot returns to OT today for 1 month follow-along visit to monitor LUE/LUQ post -mastectomy lymphedema. Pt continues to perform all daily LE self-care home program components diligently. Custom compression sleeve and glove shoew signs of wear, but remain in good , effective condition. Pt is now able to don and doff garment independently. Lymphedema apears stable at present and is well managed without progression. Pt agrees with plan to return for 3 month follow-along at which time we'll complete new garment replacement  measurements. Pt agrees to call PRN. Remainder of visit dedicated to providing MLD as established. Pt denies pain in limb, chest wall, lateral trunck and shoulder in the affected quadrant during manual therapy. Pt tolerating self-management phase of CDT withoout problems. Look forward to seeing her again in 3 months.    OT Occupational Profile and History Comprehensive Assessment- Review of records and extensive additional review of physical, cognitive, psychosocial history related to current functional performance    Occupational performance deficits (Please refer to evaluation for details): ADL's;IADL's;Leisure;Work;Social Participation;Rest and Sleep    Body Structure / Function / Physical Skills ADL;Decreased knowledge of use of DME;Pain;Edema;UE functional use;IADL;ROM;Fascial restriction;Scar mobility;Skin integrity;Decreased knowledge of precautions    Rehab Potential Good    Clinical Decision Making Several treatment options, min-mod task modification necessary    Comorbidities Affecting Occupational Performance: Presence of comorbidities impacting occupational performance    Modification or Assistance to Complete Evaluation  Min-Moderate modification of tasks or assist with assess necessary to complete eval    OT Frequency 2x / week    OT Duration 12 weeks   and PRN for follow along support and garment replacement   OT Treatment/Interventions Self-care/ADL training;DME and/or AE instruction;Manual lymph drainage;Therapeutic activities;Compression bandaging;Therapeutic exercise;Scar mobilization;Coping strategies training;Manual Therapy;Patient/family education    Plan Complete Decongestive Therapy to RUE/RUQ to include manual lymphatic drainage a(MLD), skin care with low ph lotion/ castor oil , therapeutic lymphatic pumping exercise, compression wraps graduating to compression garments for daytime and device for HOS PRN. Will also incorporate kinesiotape, myofascial release PRN, scar massage PRN     Recommended Other Services fit with comfortable and effective gradient comptression garments. It;s likely Pt will need custom sleeve and glove, ccl 2 flat knit    Consulted and Agree with Plan of Care Patient             Patient  will benefit from skilled therapeutic intervention in order to improve the following deficits and impairments:   Body Structure / Function / Physical Skills: ADL, Decreased knowledge of use of DME, Pain, Edema, UE functional use, IADL, ROM, Fascial restriction, Scar mobility, Skin integrity, Decreased knowledge of precautions       Visit Diagnosis: Postmastectomy lymphedema syndrome    Problem List Patient Active Problem List   Diagnosis Date Noted   Nonrheumatic mitral valve regurgitation 05/18/2020   Goals of care, counseling/discussion 05/17/2020   T wave inversion on electrocardiogram 05/17/2020   Breast cancer, right ([place]) 05/08/2020   History of right breast cancer 05/08/2020   Pure hypercholesterolemia 03/21/2020   Type 2 diabetes mellitus with hyperlipidemia ([place]) 03/21/2020    Andrey Spearman, MS, OTR/L, [facility] 07/23/21 10:19 AM    [place], [place], [address] Phone: [phone]   Fax:  [phone]  Name: Kristy York MRN: [id number] Date of Birth: [birth year]

## 2021-07-30 ENCOUNTER — Encounter: Payer: Medicare PPO | Admitting: Occupational Therapy

## 2021-08-02 DIAGNOSIS — E6609 Other obesity due to excess calories: Secondary | ICD-10-CM | POA: Insufficient documentation

## 2021-08-06 ENCOUNTER — Encounter: Payer: Medicare PPO | Admitting: Occupational Therapy

## 2021-08-13 ENCOUNTER — Encounter: Payer: Medicare PPO | Admitting: Occupational Therapy

## 2021-08-15 NOTE — Progress Notes (Signed)
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  INTERVAL HISTORY: Patient returns to clinic today for routine 40-monthevaluation and continuation of Zometa.  She currently feels well and is asymptomatic.  She is tolerating letrozole well without significant side effects. Her right arm lymphedema has significantly improved since evaluation in the lymphedema clinic and wearing a sleeve daily.  She has no neurologic complaints. She denies any recent fevers or illnesses.  She has a fair appetite, but denies weight loss.  She has no chest pain, shortness of breath, cough, or hemoptysis.  She denies any nausea, vomiting, constipation, or diarrhea.  She has no urinary complaints.  [name]  REVIEW OF SYSTEMS:   Review of Systems  Constitutional: Negative.  Negative for fever, malaise/fatigue and weight loss.  Respiratory: Negative.  Negative for cough, hemoptysis and shortness of breath.   Cardiovascular: Negative.  Negative for chest pain and leg swelling.  Gastrointestinal: Negative.  Negative for abdominal pain.  Genitourinary: Negative.  Negative for dysuria.  Musculoskeletal: Negative.  Negative for back pain and joint pain.  Skin: Negative.  Negative for rash.  Neurological: Negative.  Negative for dizziness, focal weakness, weakness and headaches.  Psychiatric/Behavioral: Negative.  The patient is not nervous/anxious.    As per HPI. Otherwise, a complete review of systems is negative.  PAST MEDICAL HISTORY: Past Medical History:   Diagnosis Date   Anxiety    Arthritis    Diet-controlled diabetes mellitus ([place]    borderline   Fatigue    HLD (hyperlipidemia)    Invasive carcinoma of breast ([place] 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; ER/PR (+), HER2/neu (-)   Metastasis to bone (HCC)    Breast primary   Murmur, cardiac    Peripheral edema    T wave inversion on electrocardiogram 05/17/2020   leads I, II, III, aVF, V3-V6    PAST SURGICAL HISTORY: Past Surgical History:  Procedure Laterality Date   BREAST BIOPSY Right 05/02/2020   [place], Q clip, path pending   BREAST BIOPSY Right 05/02/2020   [place] Bx, path pending   CHOLECYSTECTOMY     COLONOSCOPY     LAPAROSCOPIC ABDOMINAL EXPLORATION     MASTECTOMY MODIFIED RADICAL Right 05/23/2020   Procedure: MASTECTOMY MODIFIED RADICAL, WITH EXCISION OF AXILLARY CONTENTS;  Surgeon: BRobert Bellow MD;  Location: [facility] ORS;  Service: General;  Laterality: Right;   PORTACATH PLACEMENT Left 05/23/2020   Procedure: INSERTION PORT-A-CATH;  Surgeon: BRobert Bellow MD;  Location: [facility] ORS;  Service: General;  Laterality: Left;    FAMILY HISTORY: Family History  Problem Relation Age of Onset   Breast cancer Sister     ADVANCED DIRECTIVES (Y/N):  N  HEALTH MAINTENANCE: Social History   Tobacco Use   Smoking status: Never   Smokeless tobacco: Never  Vaping Use   Vaping Use: Never used  Substance Use Topics   Alcohol use: Never   Drug use: Never     Colonoscopy:  PAP:  Bone density:  Lipid panel:  Allergies  Allergen Reactions   Nickel     Current Outpatient Medications  Medication  Sig Dispense Refill   Calcium Carbonate-Vitamin D 600-400 MG-UNIT tablet Take by mouth.     letrozole (FEMARA) 2.5 MG tablet TAKE 1 TABLET(2.5 MG) BY MOUTH DAILY 90 tablet 3   lovastatin (MEVACOR) 40 MG tablet Take 40 mg by mouth daily.     metFORMIN (GLUCOPHAGE) 500 MG tablet Take 500 mg by mouth 2 (two) times daily with a meal.     metoprolol succinate (TOPROL-XL) 25 MG 24  hr tablet Take 25 mg by mouth daily.     lidocaine-prilocaine (EMLA) cream Apply 1 application topically See admin instructions. APPLY A SMALL AMOUNT TO PORT SITE AT LEAST 1 HOUR PRIOR TO IT BEING ACCESSED THEN COVER WITH PLASTIC WRAP 30 g 3   No current facility-administered medications for this visit.    OBJECTIVE: Vitals:   08/21/21 1110  BP: 128/75  Pulse: 73  Resp: 16  Temp: (!) 96.7 F (35.9 C)  SpO2: 98%     Body mass index is 30.71 kg/m.    ECOG FS:0 - Asymptomatic  General: Well-developed, well-nourished, no acute distress. Eyes: Pink conjunctiva, anicteric sclera. HEENT: Normocephalic, moist mucous membranes. Lungs: No audible wheezing or coughing. Heart: Regular rate and rhythm. Abdomen: Soft, nontender, no obvious distention. Musculoskeletal: No edema, cyanosis, or clubbing. Neuro: Alert, answering all questions appropriately. Cranial nerves grossly intact. Skin: No rashes or petechiae noted. Psych: Normal affect.  LAB RESULTS:  Lab Results  Component Value Date   NA 135 08/21/2021   K 3.9 08/21/2021   CL 101 08/21/2021   CO2 25 08/21/2021   GLUCOSE 97 08/21/2021   BUN 18 08/21/2021   CREATININE 0.90 08/21/2021   CALCIUM 9.0 08/21/2021   GFRNONAA >60 08/21/2021   GFRAA >60 06/18/2020    No results found for: WBC, NEUTROABS, HGB, HCT, MCV, PLT   STUDIES: No results found.   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  PLAN:    1. 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  Patient underwent palliative mastectomy with bilateral lymph node sampling on May 23, 2020.  Patient's patient's  CA 27-29 continues to fluctuate between 44.0 and 68.0.  Her most recent results were 44.4, today's results are pending.  Continue letrozole indefinitely.  We previously discussed the option of adding in chemotherapy with oral Ibrance or possibly IV treatment, but patient is not interested at this time.  Patient initiated monthly Zometa on June 18, 2020.  Proceed with Zometa today.  Return to clinic in 3 months for repeat laboratory, further evaluation, and continuation of treatment.   2.  Hypocalcemia: Resolved.  Proceed with treatment as above. 3.  Anxiety: Chronic and unchanged.  Continue Xanax as needed. 4.  Genetic testing: Patient has declined. 5.  Right arm lymphedema: Improved.  No evidence of DVT.  Continue sleeve and wrap as per lymphedema clinic instructions. 6.  Hypertension: Patient's blood pressure is within normal limits today.  Patient expressed understanding and was in agreement with this plan. She also understands that She can call clinic at any time with any questions, concerns, or complaints.    Cancer Staging  Breast cancer, right [facility]) Staging form: Breast, AJCC 8th Edition - Clinical: Stage IV (cT4b, cN2, cM1, ER+,  PR+, HER2-) - Signed by Lloyd Huger, MD on 05/17/2020 Stage prefix: Initial diagnosis   Lloyd Huger, MD   08/22/2021 2:14 PM

## 2021-08-20 ENCOUNTER — Encounter: Payer: Medicare PPO | Admitting: Occupational Therapy

## 2021-08-21 ENCOUNTER — Inpatient Hospital Stay (HOSPITAL_BASED_OUTPATIENT_CLINIC_OR_DEPARTMENT_OTHER): Payer: Medicare PPO | Admitting: Oncology

## 2021-08-21 ENCOUNTER — Inpatient Hospital Stay: Payer: Medicare PPO

## 2021-08-21 ENCOUNTER — Other Ambulatory Visit: Payer: Self-pay

## 2021-08-21 ENCOUNTER — Inpatient Hospital Stay: Payer: Medicare PPO | Attending: Oncology

## 2021-08-21 VITALS — BP 128/75 | HR 73 | Temp 96.7°F | Resp 16 | Wt 167.9 lb

## 2021-08-21 DIAGNOSIS — C50911 Malignant neoplasm of unspecified site of right female breast: Secondary | ICD-10-CM

## 2021-08-21 DIAGNOSIS — Z95828 Presence of other vascular implants and grafts: Secondary | ICD-10-CM

## 2021-08-21 DIAGNOSIS — Z170 Estrogen receptor positive status [ER+]: Secondary | ICD-10-CM | POA: Insufficient documentation

## 2021-08-21 DIAGNOSIS — C7951 Secondary malignant neoplasm of bone: Secondary | ICD-10-CM | POA: Insufficient documentation

## 2021-08-21 DIAGNOSIS — Z79811 Long term (current) use of aromatase inhibitors: Secondary | ICD-10-CM | POA: Insufficient documentation

## 2021-08-21 LAB — BASIC METABOLIC PANEL
Anion gap: 9 (ref 5–15)
BUN: 18 mg/dL (ref 8–23)
CO2: 25 mmol/L (ref 22–32)
Calcium: 9 mg/dL (ref 8.9–10.3)
Chloride: 101 mmol/L (ref 98–111)
Creatinine, Ser: 0.9 mg/dL (ref 0.44–1.00)
GFR, Estimated: >60 mL/min (ref >60)
Glucose, Bld: 97 mg/dL (ref 70–99)
Potassium: 3.9 mmol/L (ref 3.5–5.1)
Sodium: 135 mmol/L (ref 135–145)

## 2021-08-21 MED ORDER — HEPARIN SOD (PORK) LOCK FLUSH 100 UNIT/ML IV SOLN
INTRAVENOUS | Status: AC
  Administered 2021-08-21: 500 [IU] via INTRAVENOUS
  Filled 2021-08-21: qty 5

## 2021-08-21 MED ORDER — SODIUM CHLORIDE 0.9 % IV SOLN
Freq: Once | INTRAVENOUS | Status: AC
  Filled 2021-08-21: qty 250

## 2021-08-21 MED ORDER — LIDOCAINE-PRILOCAINE 2.5-2.5 % EX CREA: 1.0000 "application " | TOPICAL_CREAM | CUTANEOUS | 3 refills | Status: DC

## 2021-08-21 MED ORDER — SODIUM CHLORIDE 0.9% FLUSH
10.0000 mL | Freq: Once | INTRAVENOUS | Status: DC | PRN
  Filled 2021-08-21: qty 10

## 2021-08-21 MED ORDER — HEPARIN SOD (PORK) LOCK FLUSH 100 UNIT/ML IV SOLN
500.0000 [IU] | Freq: Once | INTRAVENOUS | Status: DC
  Filled 2021-08-21: qty 5

## 2021-08-21 MED ORDER — HEPARIN SOD (PORK) LOCK FLUSH 100 UNIT/ML IV SOLN
500.0000 [IU] | Freq: Once | INTRAVENOUS | Status: AC
  Filled 2021-08-21: qty 5

## 2021-08-21 MED ORDER — ZOLEDRONIC ACID 4 MG/100ML IV SOLN
4.0000 mg | Freq: Once | INTRAVENOUS | Status: AC
  Administered 2021-08-21: 4 mg via INTRAVENOUS
  Filled 2021-08-21: qty 100

## 2021-08-21 MED ORDER — SODIUM CHLORIDE 0.9% FLUSH
10.0000 mL | Freq: Once | INTRAVENOUS | Status: AC
  Administered 2021-08-21: 10 mL via INTRAVENOUS
  Filled 2021-08-21: qty 10

## 2021-08-21 MED ORDER — HEPARIN SOD (PORK) LOCK FLUSH 100 UNIT/ML IV SOLN
250.0000 [IU] | Freq: Once | INTRAVENOUS | Status: DC | PRN
  Filled 2021-08-21: qty 5

## 2021-08-21 NOTE — Patient Instructions (Addendum)

## 2021-08-21 NOTE — Progress Notes (Signed)
Pt has no new concerns at this time. 

## 2021-08-22 ENCOUNTER — Encounter: Payer: Self-pay | Admitting: Oncology

## 2021-08-27 ENCOUNTER — Encounter: Payer: Medicare PPO | Admitting: Occupational Therapy

## 2021-09-03 ENCOUNTER — Encounter: Payer: Medicare PPO | Admitting: Occupational Therapy

## 2021-09-10 ENCOUNTER — Encounter: Payer: Medicare PPO | Admitting: Occupational Therapy

## 2021-09-17 ENCOUNTER — Encounter: Payer: Medicare PPO | Admitting: Occupational Therapy

## 2021-09-24 ENCOUNTER — Encounter: Payer: Medicare PPO | Admitting: Occupational Therapy

## 2021-10-01 ENCOUNTER — Encounter: Payer: Medicare PPO | Admitting: Occupational Therapy

## 2021-11-11 ENCOUNTER — Other Ambulatory Visit: Payer: Self-pay | Admitting: *Deleted

## 2021-11-11 DIAGNOSIS — C50911 Malignant neoplasm of unspecified site of right female breast: Secondary | ICD-10-CM

## 2021-11-11 DIAGNOSIS — Z170 Estrogen receptor positive status [ER+]: Secondary | ICD-10-CM

## 2021-11-18 NOTE — Progress Notes (Signed)
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  INTERVAL HISTORY: Patient returns to clinic today for routine 24-monthevaluation and continuation of Zometa.  She continues to feel well and remains asymptomatic.  She is tolerating letrozole without significant side effects. She has no neurologic complaints. She denies any recent fevers or illnesses.  She has a good appetite and denies weight loss.  She has no chest pain, shortness of breath, cough, or hemoptysis.  She denies any nausea, vomiting, constipation, or diarrhea.  She has no urinary complaints.  Patient offers no specific complaints today.  REVIEW OF SYSTEMS:   Review of Systems  Constitutional: Negative.  Negative for fever, malaise/fatigue and weight loss.  Respiratory: Negative.  Negative for cough, hemoptysis and shortness of breath.   Cardiovascular: Negative.  Negative for chest pain and leg swelling.  Gastrointestinal: Negative.  Negative for abdominal pain.  Genitourinary: Negative.  Negative for dysuria.  Musculoskeletal: Negative.  Negative for back pain and joint pain.  Skin: Negative.  Negative for rash.  Neurological: Negative.  Negative for dizziness, focal weakness, weakness and headaches.  Psychiatric/Behavioral: Negative.  The patient is not nervous/anxious.    As per HPI. Otherwise, a complete review of systems is negative.  PAST MEDICAL HISTORY: Past Medical History:  Diagnosis Date   Anxiety    Arthritis    Diet-controlled diabetes mellitus ([place]    borderline   Fatigue    HLD  (hyperlipidemia)    Invasive carcinoma of breast ([place] 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; ER/PR (+), HER2/neu (-)   Metastasis to bone (HCC)    Breast primary   Murmur, cardiac    Peripheral edema    T wave inversion on electrocardiogram 05/17/2020   leads I, II, III, aVF, V3-V6    PAST SURGICAL HISTORY: Past Surgical History:  Procedure Laterality Date   BREAST BIOPSY Right 05/02/2020   [place]x, Q clip, path pending   BREAST BIOPSY Right 05/02/2020   [place] Bx, path pending   CHOLECYSTECTOMY     COLONOSCOPY     LAPAROSCOPIC ABDOMINAL EXPLORATION     MASTECTOMY MODIFIED RADICAL Right 05/23/2020   Procedure: MASTECTOMY MODIFIED RADICAL, WITH EXCISION OF AXILLARY CONTENTS;  Surgeon: BRobert Bellow MD;  Location: [facility] ORS;  Service: General;  Laterality: Right;   PORTACATH PLACEMENT Left 05/23/2020   Procedure: INSERTION PORT-A-CATH;  Surgeon: BRobert Bellow MD;  Location: [facility] ORS;  Service: General;  Laterality: Left;    FAMILY HISTORY: Family History  Problem Relation Age of Onset   Breast cancer Sister     ADVANCED DIRECTIVES (Y/N):  N  HEALTH MAINTENANCE: Social History   Tobacco Use   Smoking status: Never   Smokeless tobacco: Never  Vaping Use   Vaping Use: Never used  Substance Use Topics   Alcohol use: Never   Drug use: Never     Colonoscopy:  PAP:  Bone density:  Lipid panel:  Allergies  Allergen Reactions   Nickel     Current Outpatient Medications  Medication Sig Dispense Refill   Calcium Carbonate-Vitamin D 600-400 MG-UNIT tablet Take by mouth.     letrozole ([facility]  2.5 MG tablet TAKE 1 TABLET(2.5 MG) BY MOUTH DAILY 90 tablet 3   lidocaine-prilocaine (EMLA) cream Apply 1 application topically See admin instructions. APPLY A SMALL AMOUNT TO PORT SITE AT LEAST 1 HOUR PRIOR TO IT BEING ACCESSED THEN COVER WITH PLASTIC WRAP 30 g 3   lovastatin (MEVACOR) 40 MG tablet Take 40 mg by mouth daily.     metFORMIN (GLUCOPHAGE) 500 MG tablet Take 500 mg by  mouth 2 (two) times daily with a meal.     metoprolol succinate (TOPROL-XL) 25 MG 24 hr tablet Take 25 mg by mouth daily.     No current facility-administered medications for this visit.    OBJECTIVE: Vitals:   11/21/21 1409  BP: 133/70  Pulse: 79  Resp: 18  Temp: 97.9 F (36.6 C)  SpO2: 99%     Body mass index is 32.03 kg/m.    ECOG FS:0 - Asymptomatic  General: Well-developed, well-nourished, no acute distress. Eyes: Pink conjunctiva, anicteric sclera. HEENT: Normocephalic, moist mucous membranes. Lungs: No audible wheezing or coughing. Heart: Regular rate and rhythm. Abdomen: Soft, nontender, no obvious distention. Musculoskeletal: No edema, cyanosis, or clubbing.  Right arm lymphedema with lymphedema sleeve noted. Neuro: Alert, answering all questions appropriately. Cranial nerves grossly intact. Skin: No rashes or petechiae noted. Psych: Normal affect.  LAB RESULTS:  Lab Results  Component Value Date   NA 137 11/21/2021   K 3.6 11/21/2021   CL 104 11/21/2021   CO2 26 11/21/2021   GLUCOSE 138 (H) 11/21/2021   BUN 13 11/21/2021   CREATININE 0.81 11/21/2021   CALCIUM 9.0 11/21/2021   GFRNONAA >60 11/21/2021   GFRAA >60 06/18/2020    Lab Results  Component Value Date   WBC 7.8 11/21/2021   NEUTROABS 4.5 11/21/2021   HGB 12.5 11/21/2021   HCT 38.8 11/21/2021   MCV 86.2 11/21/2021   PLT 306 11/21/2021     STUDIES: No results found.   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  PLAN:    1. 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  Patient underwent palliative mastectomy with bilateral lymph  node sampling on May 23, 2020.  Patient's patient's CA 27-29 continues to fluctuate between 41.1 and 68.0.  Today's results are 41.1.  Continue letrozole indefinitely.  We previously discussed the option of adding in chemotherapy with oral Ibrance or [place] possibly IV treatment, but patient is not interested at this time.  Patient initiated monthly Zometa on June 18, 2020.  Proceed with Zometa today.  Return to clinic in 3 months for repeat laboratory work, further evaluation, and continuation of treatment. 2.  Hypocalcemia: Resolved.  Proceed with treatment as above. 3.  Anxiety: Significantly improved.  Continue Xanax as needed. 4.  Genetic testing: Patient has declined. 5.  Right arm lymphedema: Improved.  No evidence of DVT.  Continue sleeve and wrap as per lymphedema clinic instructions. 6.  Hypertension: Patient's blood pressure continues to be within normal limits.  I spent a total of 30 minutes reviewing chart data, face-to-face evaluation with the patient, counseling and coordination of care as detailed above.  Patient expressed understanding and was in agreement with this plan. She also understands that She can call  clinic at any time with any questions, concerns, or complaints.    Cancer Staging  Breast cancer, right [facility]) Staging form: Breast, AJCC 8th Edition - Clinical: Stage IV (cT4b, cN2, cM1, ER+, PR+, HER2-) - Signed by Lloyd Huger, MD on 05/17/2020 Stage prefix: Initial diagnosis   Lloyd Huger, MD   11/22/2021 2:46 PM

## 2021-11-21 ENCOUNTER — Inpatient Hospital Stay: Payer: Medicare PPO | Attending: Oncology

## 2021-11-21 ENCOUNTER — Inpatient Hospital Stay: Payer: Medicare PPO

## 2021-11-21 ENCOUNTER — Other Ambulatory Visit: Payer: Self-pay

## 2021-11-21 ENCOUNTER — Inpatient Hospital Stay (HOSPITAL_BASED_OUTPATIENT_CLINIC_OR_DEPARTMENT_OTHER): Payer: Medicare PPO | Admitting: Oncology

## 2021-11-21 VITALS — BP 133/70 | HR 79 | Temp 97.9°F | Resp 18 | Wt 175.1 lb

## 2021-11-21 DIAGNOSIS — C773 Secondary and unspecified malignant neoplasm of axilla and upper limb lymph nodes: Secondary | ICD-10-CM | POA: Insufficient documentation

## 2021-11-21 DIAGNOSIS — C50911 Malignant neoplasm of unspecified site of right female breast: Secondary | ICD-10-CM

## 2021-11-21 DIAGNOSIS — Z170 Estrogen receptor positive status [ER+]: Secondary | ICD-10-CM

## 2021-11-21 DIAGNOSIS — C7951 Secondary malignant neoplasm of bone: Secondary | ICD-10-CM | POA: Diagnosis not present

## 2021-11-21 LAB — CBC WITH DIFFERENTIAL/PLATELET
Abs Immature Granulocytes: 0.03 10*3/uL (ref 0.00–0.07)
Basophils Absolute: 0 10*3/uL (ref 0.0–0.1)
Basophils Relative: 1 %
Eosinophils Absolute: 0 10*3/uL (ref 0.0–0.5)
Eosinophils Relative: 0 %
HCT: 38.8 % (ref 36.0–46.0)
Hemoglobin: 12.5 g/dL (ref 12.0–15.0)
Immature Granulocytes: 0 %
Lymphocytes Relative: 35 %
Lymphs Abs: 2.8 10*3/uL (ref 0.7–4.0)
MCH: 27.8 pg (ref 26.0–34.0)
MCHC: 32.2 g/dL (ref 30.0–36.0)
MCV: 86.2 fL (ref 80.0–100.0)
Monocytes Absolute: 0.5 10*3/uL (ref 0.1–1.0)
Monocytes Relative: 6 %
Neutro Abs: 4.5 10*3/uL (ref 1.7–7.7)
Neutrophils Relative %: 58 %
Platelets: 306 10*3/uL (ref 150–400)
RBC: 4.5 MIL/uL (ref 3.87–5.11)
RDW: 13.9 % (ref 11.5–15.5)
WBC: 7.8 10*3/uL (ref 4.0–10.5)
nRBC: 0 % (ref 0.0–0.2)

## 2021-11-21 LAB — BASIC METABOLIC PANEL
Anion gap: 7 (ref 5–15)
BUN: 13 mg/dL (ref 8–23)
CO2: 26 mmol/L (ref 22–32)
Calcium: 9 mg/dL (ref 8.9–10.3)
Chloride: 104 mmol/L (ref 98–111)
Creatinine, Ser: 0.81 mg/dL (ref 0.44–1.00)
GFR, Estimated: >60 mL/min (ref >60)
Glucose, Bld: 138 mg/dL — ABNORMAL HIGH (ref 70–99)
Potassium: 3.6 mmol/L (ref 3.5–5.1)
Sodium: 137 mmol/L (ref 135–145)

## 2021-11-21 MED ORDER — SODIUM CHLORIDE 0.9 % IV SOLN
INTRAVENOUS | Status: DC
  Filled 2021-11-21: qty 250

## 2021-11-21 MED ORDER — ZOLEDRONIC ACID 4 MG/100ML IV SOLN
4.0000 mg | Freq: Once | INTRAVENOUS | Status: AC
  Administered 2021-11-21: 4 mg via INTRAVENOUS
  Filled 2021-11-21: qty 100

## 2021-11-21 MED ORDER — HEPARIN SOD (PORK) LOCK FLUSH 100 UNIT/ML IV SOLN
500.0000 [IU] | Freq: Once | INTRAVENOUS | Status: AC
  Administered 2021-11-21: 500 [IU]
  Filled 2021-11-21: qty 5

## 2021-11-21 NOTE — Patient Instructions (Signed)
[facility] AT [place]  Discharge Instructions: Thank you for choosing [facility] to provide your oncology and hematology care.  If you have a lab appointment with the [facility], please go directly to the [facility] and check in at the registration area.  Wear comfortable clothing and clothing appropriate for easy access to any Portacath or PICC line.   We strive to give you quality time with your provider. You may need to reschedule your appointment if you arrive late (15 or more minutes).  Arriving late affects you and other patients whose appointments are after yours.  Also, if you miss three or more appointments without notifying the office, you may be dismissed from the clinic at the providers discretion.      For prescription refill requests, have your pharmacy contact our office and allow 72 hours for refills to be completed.    Today you received the following : Zometa    To help prevent nausea and vomiting after your treatment, we encourage you to take your nausea medication as directed.  BELOW ARE SYMPTOMS THAT SHOULD BE REPORTED IMMEDIATELY: *FEVER GREATER THAN 100.4 F (38 C) OR HIGHER *CHILLS OR SWEATING *NAUSEA AND VOMITING THAT IS NOT CONTROLLED WITH YOUR NAUSEA MEDICATION *UNUSUAL SHORTNESS OF BREATH *UNUSUAL BRUISING OR BLEEDING *URINARY PROBLEMS (pain or burning when urinating, or frequent urination) *BOWEL PROBLEMS (unusual diarrhea, constipation, pain near the anus) TENDERNESS IN MOUTH AND THROAT WITH OR WITHOUT PRESENCE OF ULCERS (sore throat, sores in mouth, or a toothache) UNUSUAL RASH, SWELLING OR PAIN  UNUSUAL VAGINAL DISCHARGE OR ITCHING   Items with * indicate a potential emergency and should be followed up as soon as possible or go to the Emergency Department if any problems should occur.  Please show the CHEMOTHERAPY ALERT CARD or IMMUNOTHERAPY ALERT CARD at check-in to the Emergency Department and triage  nurse.  Should you have questions after your visit or need to cancel or reschedule your appointment, please contact [facility] CANCER [place] AT [place]  [phone] and follow the prompts.  Office hours are 8:00 a.m. to 4:30 p.m. Monday - Friday. Please note that voicemails left after 4:00 p.m. may not be returned until the following business day.  We are closed weekends and major holidays. You have access to a nurse at all times for urgent questions. Please call the main number to the clinic [phone] and follow the prompts.  For any non-urgent questions, you may also contact your provider using MyChart. We now offer e-Visits for anyone 25 and older to request care online for non-urgent symptoms. For details visit [email or web address].   Also download the MyChart app! Go to the app store, search "MyChart", open the app, select [place], and log in with your MyChart username and password.  Due to Covid, a mask is required upon entering the hospital/clinic. If you do not have a mask, one will be given to you upon arrival. For doctor visits, patients may have 1 support person aged 78 or older with them. For treatment visits, patients cannot have anyone with them due to current Covid guidelines and our immunocompromised population.

## 2021-11-22 LAB — CANCER ANTIGEN 27.29: CA 27.29: 41.1 U/mL — ABNORMAL HIGH (ref 0.0–38.6)

## 2021-11-23 ENCOUNTER — Encounter: Payer: Self-pay | Admitting: Oncology

## 2021-12-13 IMAGING — US US BREAST*R* LIMITED INC AXILLA
1 series · 11 of 11 positions shown · non-contrast
Comparison: None.

CLINICAL DATA: RIGHT breast wound for 3-6 months.

EXAM:
DIGITAL DIAGNOSTIC BILATERAL MAMMOGRAM WITH CAD AND TOMO
ULTRASOUND BILATERAL BREAST

[Series 1: us breast*right* limited inc axilla · 0.07mm/px · 11 of 11 slices shown]
[im 1/11]
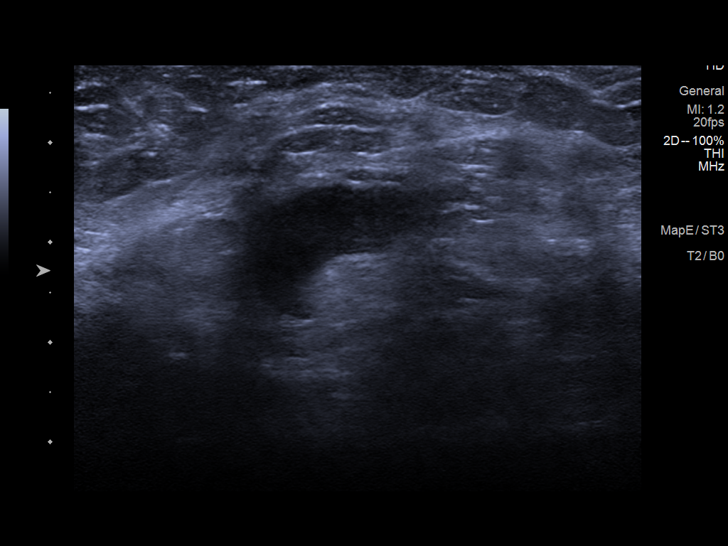
[im 2/11]
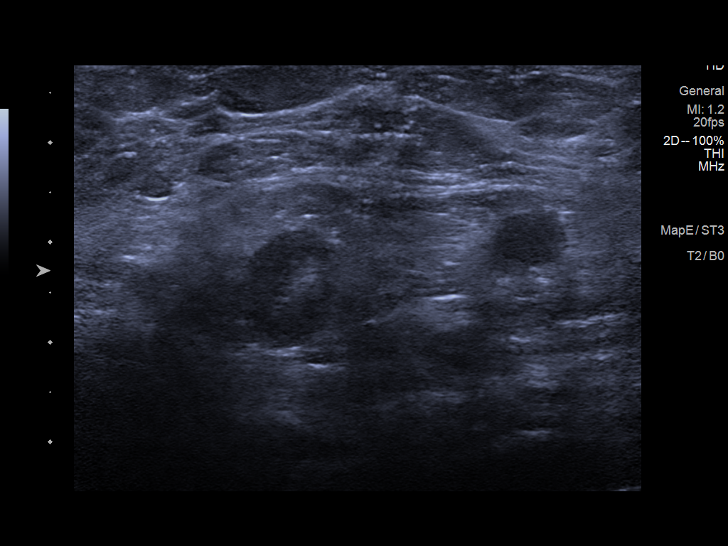
[im 3/11]
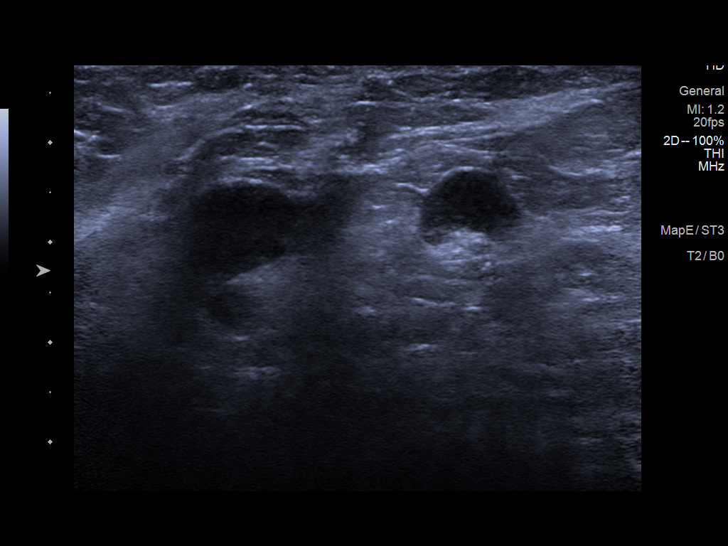
[im 4/11]
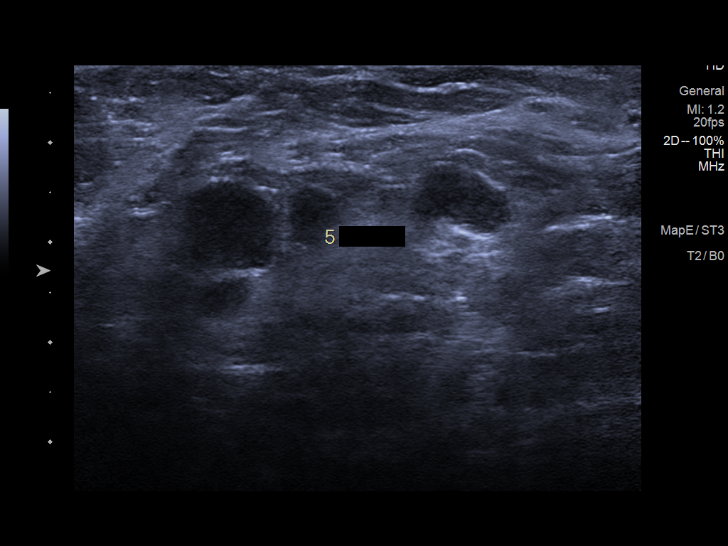
[im 5/11]
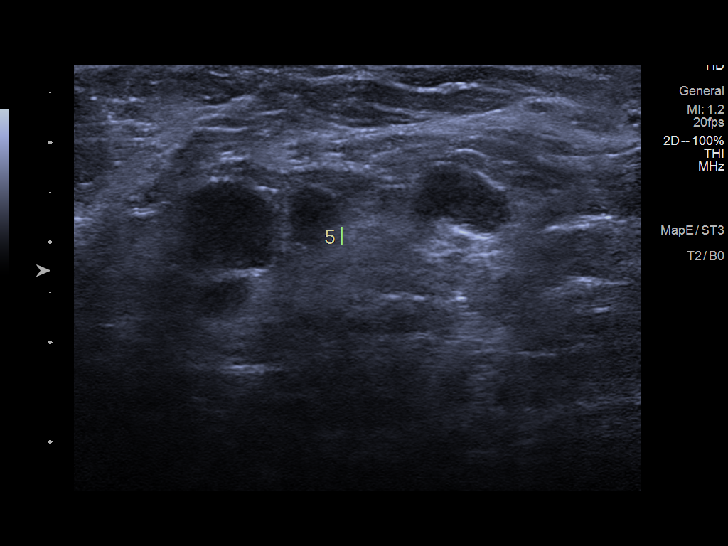
[im 6/11]
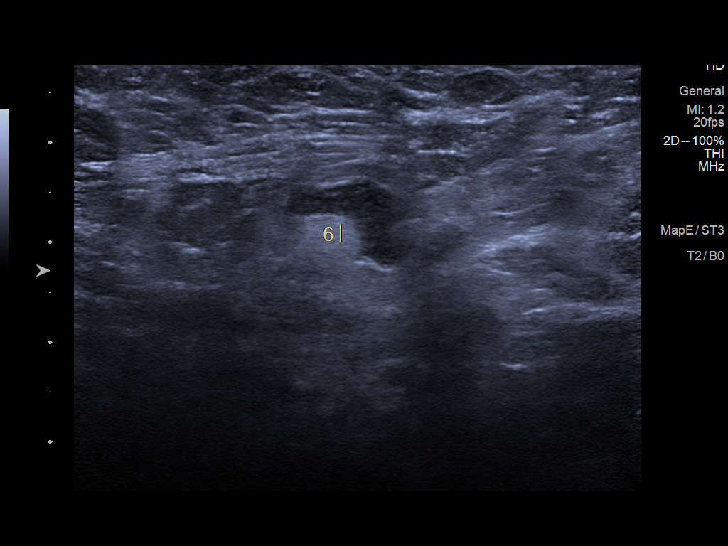
[im 7/11]
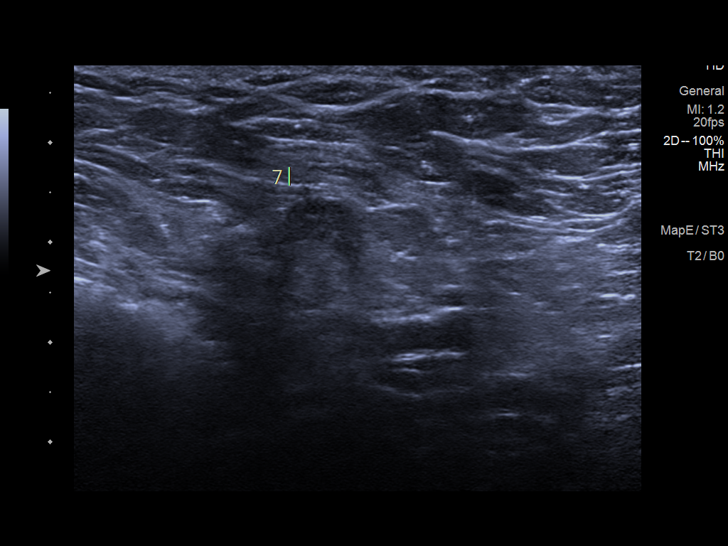
[im 8/11]
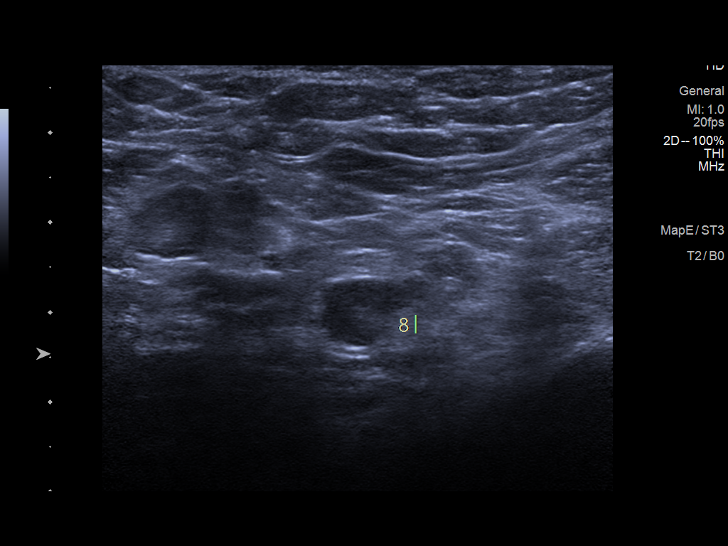
[im 9/11]
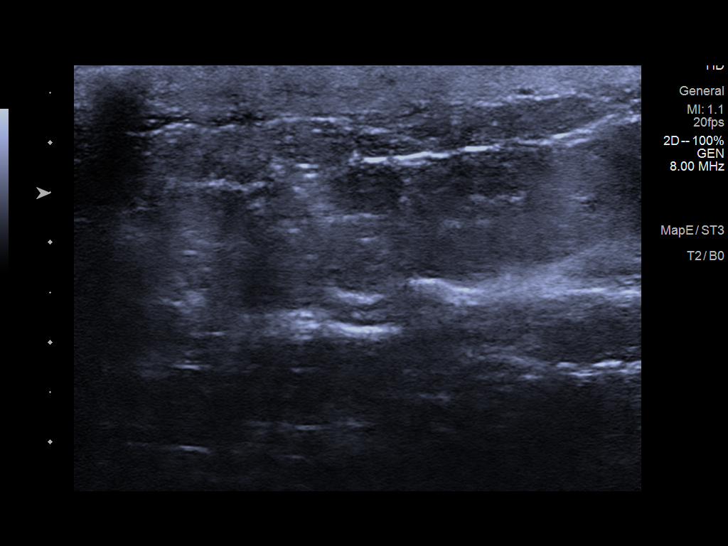
[im 10/11]
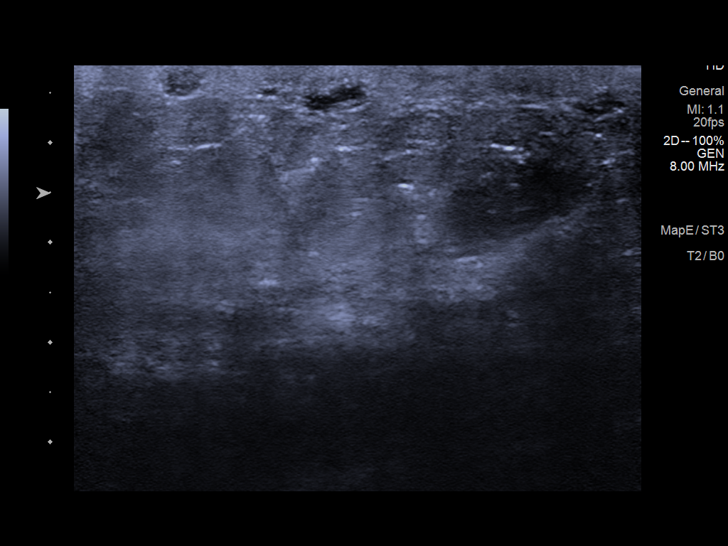
[im 11/11]
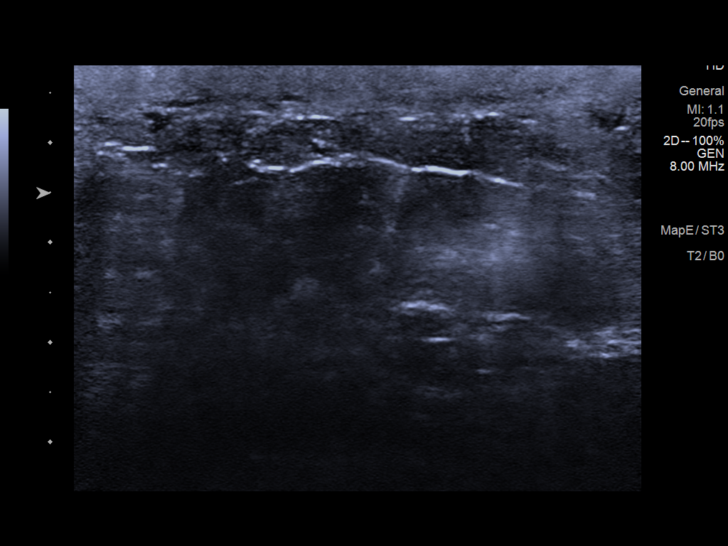

[11 of 11 positions shown; findings below may reference images not displayed]

ACR Breast Density Category b: There are scattered areas of
fibroglandular density.
FINDINGS: RIGHT BREAST:

Mammogram: RIGHT breast is largely replaced by an ulcerating mass.
There is marked skin and trabecular thickening, skin measuring up to
11 millimeters anteriorly. The central portion of the RIGHT breast
shows an irregular mass with spiculated margins measuring at least
5.6 x 7.7 x 5.5 centimeters. This mass is contiguous with the
thickened, irregular nipple. Large ulcerating cavity identified
along the UPPER OUTER QUADRANT. Mammographic images were processed
with CAD.

Physical Exam: The RIGHT breast is replaced by a large mass. Along
the superior portion of the RIGHT breast, there is a large open
wound, measuring at least 10 centimeters in maximum diameter and 2
centimeters in depth. The RIGHT nipple is enlarged, thickened, and
Friedel in appearance. The skin of the entire RIGHT breast is hyper
pigmented and firm. I palpate enlarged RIGHT axillary lymph nodes.

Ultrasound: Ultrasound of the RIGHT breast is limited by the open
wound. Limited ultrasound along the inferior and LATERAL portions
are RIGHT breast show marked skin thickening. The mass suspected in
the central portion of the RIGHT breast is not well seen
sonographically due to marked breast edema.

Evaluation of the RIGHT axilla shows at least 8 lymph nodes with
abnormal cortical thickening. Largest lymph nodes has a cortical
thickness of 7 millimeters.

LEFT BREAST:

Mammogram: An oval circumscribed mass is identified in the superior
LATERAL portion of the LEFT breast. Mass measures 1.6 x
centimeters. Remainder of the LEFT breast is negative. Mammographic
images were processed with CAD.

Physical Exam: I palpate enlarged LOWER LEFT axillary lymph node.

Ultrasound: Targeted ultrasound is performed, showing numerous
enlarged LEFT axillary lymph nodes, largest corresponding to the
mammographic finding and measuring 2.3 x 1.5 x 1.8 centimeters.
Additionally, 3 other axillary lymph nodes have abnormal morphology
with thickened cortex.
IMPRESSION: 1. Large ulcerating RIGHT breast mass is highly likely to represent
malignancy.
2. Suspect metastatic lymph nodes to the RIGHT axilla.
3. Suspect LEFT axillary adenopathy. There is no imaging evidence
for primary malignancy of the LEFT breast, raising the question of
diffuse metastatic disease.
4. At the patient's specific request, results of this study have not
been shared with her. Instead, at her request, I spoke with her
Kandi, Rezeki in person, and her Velarde, Bunmi by telephone in a
conference call.

RECOMMENDATION:
1. Recommend ultrasound-guided core biopsy of RIGHT breast and RIGHT
axilla. If the patient agrees to only a single biopsy, I would
recommend core biopsy of the RIGHT breast.
2. Tissue diagnosis of the LEFT axilla may be helpful but could be
deferred, pending total-body scans.

I have discussed the findings and recommendations with the patient's
sister and son. If applicable, a reminder letter will be sent to the
patient regarding the next appointment.

BI-RADS CATEGORY  5: Highly suggestive of malignancy.

## 2022-02-23 NOTE — Progress Notes (Unsigned)
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  INTERVAL HISTORY: Patient returns to clinic today for routine 64-monthevaluation and continuation of Zometa.  She continues to have left hip pain, but otherwise feels well.  She is tolerating letrozole without significant side effects. She has no neurologic complaints. She denies any recent fevers or illnesses.  She has a good appetite and denies weight loss.  She has no chest pain, shortness of breath, cough, or hemoptysis.  She denies any nausea, vomiting, constipation, or diarrhea.  She has no urinary complaints.  [name]  REVIEW OF SYSTEMS:   Review of Systems  Constitutional: Negative.  Negative for fever, malaise/fatigue and weight loss.  Respiratory: Negative.  Negative for cough, hemoptysis and shortness of breath.   Cardiovascular: Negative.  Negative for chest pain and leg swelling.  Gastrointestinal: Negative.  Negative for abdominal pain.  Genitourinary: Negative.  Negative for dysuria.  Musculoskeletal:  Positive for joint pain. Negative for back pain.  Skin: Negative.  Negative for rash.  Neurological: Negative.  Negative for dizziness, focal weakness, weakness and headaches.  Psychiatric/Behavioral: Negative.  The patient is not nervous/anxious.    As per HPI. Otherwise, a complete review of systems is negative.  PAST MEDICAL HISTORY: Past Medical History:  Diagnosis Date   Anxiety    Arthritis    D[place]controlled diabetes mellitus ([place]    borderline   Fatigue     HLD (hyperlipidemia)    Invasive carcinoma of breast ([place] 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; ER/PR (+), HER2/neu (-)   Metastasis to bone (HCC)    Breast primary   Murmur, cardiac    Peripheral edema    T wave inversion on electrocardiogram 05/17/2020   leads I, II, III, aVF, V3-V6    PAST SURGICAL HISTORY: Past Surgical History:  Procedure Laterality Date   BREAST BIOPSY Right 05/02/2020   [place], Q clip, path pending   BREAST BIOPSY Right 05/02/2020   [place] Bx, path pending   CHOLECYSTECTOMY     COLONOSCOPY     LAPAROSCOPIC ABDOMINAL EXPLORATION     MASTECTOMY MODIFIED RADICAL Right 05/23/2020   Procedure: MASTECTOMY MODIFIED RADICAL, WITH EXCISION OF AXILLARY CONTENTS;  Surgeon: BRobert Bellow MD;  Location: [facility] ORS;  Service: General;  Laterality: Right;   PORTACATH PLACEMENT Left 05/23/2020   Procedure: INSERTION PORT-A-CATH;  Surgeon: BRobert Bellow MD;  Location: [facility] ORS;  Service: General;  Laterality: Left;    FAMILY HISTORY: Family History  Problem Relation Age of Onset   Breast cancer Sister     ADVANCED DIRECTIVES (Y/N):  N  HEALTH MAINTENANCE: Social History   Tobacco Use   Smoking status: Never   Smokeless tobacco: Never  Vaping Use   Vaping Use: Never used  Substance Use Topics   Alcohol use: Never   Drug use: Never     Colonoscopy:  PAP:  Bone density:  Lipid panel:  Allergies  Allergen Reactions   Nickel     Current Outpatient Medications  Medication Sig Dispense Refill   letrozole (FEMARA) 2.5 MG tablet TAKE 1 TABLET(2.5 MG) BY MOUTH  DAILY 90 tablet 3   lisinopril (ZESTRIL) 5 MG tablet Take by mouth.     lovastatin (MEVACOR) 40 MG tablet Take 40 mg by mouth daily.     metFORMIN (GLUCOPHAGE) 500 MG tablet Take 500 mg by mouth 2 (two) times daily with a meal.     metoprolol succinate (TOPROL-XL) 25 MG 24 hr tablet Take 25 mg by mouth daily.     Calcium Carbonate-Vitamin D 600-400 MG-UNIT tablet Take by mouth. (Patient not taking:  Reported on 02/25/2022)     lidocaine-prilocaine (EMLA) cream Apply 1 application topically See admin instructions. APPLY A SMALL AMOUNT TO PORT SITE AT LEAST 1 HOUR PRIOR TO IT BEING ACCESSED THEN COVER WITH PLASTIC WRAP (Patient not taking: Reported on 02/25/2022) 30 g 3   No current facility-administered medications for this visit.    OBJECTIVE: Vitals:   02/25/22 1311  BP: 122/81  Pulse: 80  Resp: 16  Temp: (!) 97 F (36.1 C)  SpO2: 100%     Body mass index is 30.73 kg/m.    ECOG FS:0 - Asymptomatic  General: Well-developed, well-nourished, no acute distress. Eyes: Pink conjunctiva, anicteric sclera. HEENT: Normocephalic, moist mucous membranes. Lungs: No audible wheezing or coughing. Heart: Regular rate and rhythm. Abdomen: Soft, nontender, no obvious distention. Musculoskeletal: No edema, cyanosis, or clubbing. Neuro: Alert, answering all questions appropriately. Cranial nerves grossly intact. Skin: No rashes or petechiae noted. Psych: Normal affect.  LAB RESULTS:  Lab Results  Component Value Date   NA 139 02/25/2022   K 3.8 02/25/2022   CL 105 02/25/2022   CO2 28 02/25/2022   GLUCOSE 139 (H) 02/25/2022   BUN 12 02/25/2022   CREATININE 0.92 02/25/2022   CALCIUM 9.0 02/25/2022   GFRNONAA >60 02/25/2022   GFRAA >60 06/18/2020    Lab Results  Component Value Date   WBC 6.9 02/25/2022   NEUTROABS 3.9 02/25/2022   HGB 12.7 02/25/2022   HCT 38.8 02/25/2022   MCV 86.4 02/25/2022   PLT 348 02/25/2022     STUDIES: No results found.   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  PLAN:    1. 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  Patient underwent palliative mastectomy with bilateral lymph node sampling on May 23, 2020.  Patient's patient's CA 27-29 continues to fluctuate between 41.1 and 68.0.  Today's results are 141.6.  Continue letrozole indefinitely.  We previously discussed the option of adding in chemotherapy with oral Ibrance or Kisqali, but patient is not interested at this time.  Patient initiated monthly Zometa on June 18, 2020.  Proceed with Zometa today.  Return to clinic in 3 months with repeat laboratory work, further evaluation, and continuation of treatment. 2.  Hypocalcemia: Resolved.  Proceed with treatment as above. 3.  Anxiety: Significantly improved.  Continue Xanax as needed. 4.  Genetic testing: Patient has declined. 5.  Right arm lymphedema: Improved.  No evidence of DVT.  Continue sleeve and wrap as per lymphedema clinic instructions. 6.  Hypertension: Patient's blood pressure continues to be within normal limits.  I spent a total of 30 minutes reviewing chart data, face-to-face evaluation with the patient, counseling and coordination of care as detailed above.  Patient expressed understanding and was in agreement with this plan. She also understands that She can call clinic at any time with any questions, concerns, or complaints.    Cancer Staging  Breast cancer, right [facility]) Staging form: Breast, AJCC 8th Edition - Clinical: Stage IV (cT4b, cN2, cM1, ER+, PR+, HER2-) - Signed by Lloyd Huger, MD on 05/17/2020 Stage prefix: Initial diagnosis   Lloyd Huger, MD   02/26/2022 6:32 AM

## 2022-02-25 ENCOUNTER — Inpatient Hospital Stay (HOSPITAL_BASED_OUTPATIENT_CLINIC_OR_DEPARTMENT_OTHER): Payer: Medicare PPO | Admitting: Oncology

## 2022-02-25 ENCOUNTER — Inpatient Hospital Stay: Payer: Medicare PPO

## 2022-02-25 ENCOUNTER — Inpatient Hospital Stay: Payer: Medicare PPO | Attending: Oncology

## 2022-02-25 ENCOUNTER — Encounter: Payer: Self-pay | Admitting: Oncology

## 2022-02-25 VITALS — BP 122/81 | HR 80 | Temp 97.0°F | Resp 16 | Ht 62.0 in | Wt 168.0 lb

## 2022-02-25 DIAGNOSIS — C50911 Malignant neoplasm of unspecified site of right female breast: Secondary | ICD-10-CM

## 2022-02-25 DIAGNOSIS — Z170 Estrogen receptor positive status [ER+]: Secondary | ICD-10-CM | POA: Diagnosis not present

## 2022-02-25 DIAGNOSIS — Z79811 Long term (current) use of aromatase inhibitors: Secondary | ICD-10-CM | POA: Insufficient documentation

## 2022-02-25 DIAGNOSIS — Z79899 Other long term (current) drug therapy: Secondary | ICD-10-CM | POA: Insufficient documentation

## 2022-02-25 DIAGNOSIS — C7951 Secondary malignant neoplasm of bone: Secondary | ICD-10-CM | POA: Insufficient documentation

## 2022-02-25 LAB — BASIC METABOLIC PANEL
Anion gap: 6 (ref 5–15)
BUN: 12 mg/dL (ref 8–23)
CO2: 28 mmol/L (ref 22–32)
Calcium: 9 mg/dL (ref 8.9–10.3)
Chloride: 105 mmol/L (ref 98–111)
Creatinine, Ser: 0.92 mg/dL (ref 0.44–1.00)
GFR, Estimated: >60 mL/min (ref >60)
Glucose, Bld: 139 mg/dL — ABNORMAL HIGH (ref 70–99)
Potassium: 3.8 mmol/L (ref 3.5–5.1)
Sodium: 139 mmol/L (ref 135–145)

## 2022-02-25 LAB — CBC WITH DIFFERENTIAL/PLATELET
Abs Immature Granulocytes: 0.01 10*3/uL (ref 0.00–0.07)
Basophils Absolute: 0 10*3/uL (ref 0.0–0.1)
Basophils Relative: 0 %
Eosinophils Absolute: 0 10*3/uL (ref 0.0–0.5)
Eosinophils Relative: 0 %
HCT: 38.8 % (ref 36.0–46.0)
Hemoglobin: 12.7 g/dL (ref 12.0–15.0)
Immature Granulocytes: 0 %
Lymphocytes Relative: 37 %
Lymphs Abs: 2.6 10*3/uL (ref 0.7–4.0)
MCH: 28.3 pg (ref 26.0–34.0)
MCHC: 32.7 g/dL (ref 30.0–36.0)
MCV: 86.4 fL (ref 80.0–100.0)
Monocytes Absolute: 0.4 10*3/uL (ref 0.1–1.0)
Monocytes Relative: 6 %
Neutro Abs: 3.9 10*3/uL (ref 1.7–7.7)
Neutrophils Relative %: 57 %
Platelets: 348 10*3/uL (ref 150–400)
RBC: 4.49 MIL/uL (ref 3.87–5.11)
RDW: 14.1 % (ref 11.5–15.5)
WBC: 6.9 10*3/uL (ref 4.0–10.5)
nRBC: 0 % (ref 0.0–0.2)

## 2022-02-25 MED ORDER — SODIUM CHLORIDE 0.9 % IV SOLN
INTRAVENOUS | Status: DC
  Filled 2022-02-25: qty 250

## 2022-02-25 MED ORDER — HEPARIN SOD (PORK) LOCK FLUSH 100 UNIT/ML IV SOLN
INTRAVENOUS | Status: AC
  Filled 2022-02-25: qty 5

## 2022-02-25 MED ORDER — HEPARIN SOD (PORK) LOCK FLUSH 100 UNIT/ML IV SOLN
500.0000 [IU] | Freq: Once | INTRAVENOUS | Status: AC
  Administered 2022-02-25: 500 [IU] via INTRAVENOUS
  Filled 2022-02-25: qty 5

## 2022-02-25 MED ORDER — ZOLEDRONIC ACID 4 MG/100ML IV SOLN
4.0000 mg | Freq: Once | INTRAVENOUS | Status: AC
  Administered 2022-02-25: 4 mg via INTRAVENOUS
  Filled 2022-02-25: qty 100

## 2022-02-26 ENCOUNTER — Encounter: Payer: Self-pay | Admitting: Oncology

## 2022-02-26 LAB — CANCER ANTIGEN 27.29: CA 27.29: 41.6 U/mL — ABNORMAL HIGH (ref 0.0–38.6)

## 2022-04-21 ENCOUNTER — Other Ambulatory Visit: Payer: Self-pay | Admitting: Oncology

## 2022-06-04 NOTE — Progress Notes (Signed)
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  INTERVAL HISTORY: Patient returns to clinic today for routine 80-monthevaluation and continuation of Zometa.  She continues to feel well and remains asymptomatic.  She does not complain of pain today.  She is tolerating letrozole without significant side effects. She has no neurologic complaints. She denies any recent fevers or illnesses.  She has a good appetite and denies weight loss.  She has no chest pain, shortness of breath, cough, or hemoptysis.  She denies any nausea, vomiting, constipation, or diarrhea.  She has no urinary complaints.  Patient offers no specific complaints today.  REVIEW OF SYSTEMS:   Review of Systems  Constitutional: Negative.  Negative for fever, malaise/fatigue and weight loss.  Respiratory: Negative.  Negative for cough, hemoptysis and shortness of breath.   Cardiovascular: Negative.  Negative for chest pain and leg swelling.  Gastrointestinal: Negative.  Negative for abdominal pain.  Genitourinary: Negative.  Negative for dysuria.  Musculoskeletal: Negative.  Negative for back pain and joint pain.  Skin: Negative.  Negative for rash.  Neurological: Negative.  Negative for dizziness, focal weakness, weakness and headaches.  Psychiatric/Behavioral: Negative.  The patient is not nervous/anxious.     As per HPI. Otherwise, a complete review of systems is negative.  PAST MEDICAL HISTORY: Past Medical History:  Diagnosis Date   Anxiety    Arthritis    D[place]controlled diabetes mellitus ([place]     borderline   Fatigue    HLD (hyperlipidemia)    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; ER/PR (+), HER2/neu (-)   Metastasis to bone (HCC)    Breast primary   Murmur, cardiac    Peripheral edema    T wave inversion on electrocardiogram 05/17/2020   leads I, II, III, aVF, V3-V6    PAST SURGICAL HISTORY: Past Surgical History:  Procedure Laterality Date   BREAST BIOPSY Right 05/02/2020   [place], Q clip, path pending   BREAST BIOPSY Right 05/02/2020   [place] Bx, path pending   CHOLECYSTECTOMY     COLONOSCOPY     LAPAROSCOPIC ABDOMINAL EXPLORATION     MASTECTOMY MODIFIED RADICAL Right 05/23/2020   Procedure: MASTECTOMY MODIFIED RADICAL, WITH EXCISION OF AXILLARY CONTENTS;  Surgeon: BRobert Bellow MD;  Location: [facility] ORS;  Service: General;  Laterality: Right;   PORTACATH PLACEMENT Left 05/23/2020   Procedure: INSERTION PORT-A-CATH;  Surgeon: BRobert Bellow MD;  Location: [facility] ORS;  Service: General;  Laterality: Left;    FAMILY HISTORY: Family History  Problem Relation Age of Onset   Breast cancer Sister     ADVANCED DIRECTIVES (Y/N):  N  HEALTH MAINTENANCE: Social History   Tobacco Use   Smoking status: Never   Smokeless tobacco: Never  Vaping Use   Vaping Use: Never used  Substance Use Topics   Alcohol use: Never   Drug use: Never     Colonoscopy:  PAP:  Bone density:  Lipid panel:  Allergies  Allergen Reactions   Nickel     Current Outpatient Medications  Medication Sig Dispense Refill   Calcium Carbonate-Vitamin D 600-400 MG-UNIT tablet  Take by mouth.     letrozole (FEMARA) 2.5 MG tablet TAKE 1 TABLET(2.5 MG) BY MOUTH DAILY 90 tablet 3   lisinopril (ZESTRIL) 5 MG tablet Take by mouth.     lisinopril (ZESTRIL) 5 MG tablet Take 1 tablet by mouth at bedtime.     lovastatin (MEVACOR) 40 MG tablet Take 1 tablet by mouth at bedtime.     metFORMIN (GLUCOPHAGE) 500 MG tablet Take 500 mg by mouth 2 (two) times daily with a meal.      metoprolol succinate (TOPROL-XL) 25 MG 24 hr tablet Take 25 mg by mouth daily.     lidocaine-prilocaine (EMLA) cream Apply 1 application topically See admin instructions. APPLY A SMALL AMOUNT TO PORT SITE AT LEAST 1 HOUR PRIOR TO IT BEING ACCESSED THEN COVER WITH PLASTIC WRAP (Patient not taking: Reported on 02/25/2022) 30 g 3   lovastatin (MEVACOR) 40 MG tablet Take 40 mg by mouth daily.     No current facility-administered medications for this visit.    OBJECTIVE: Vitals:   06/05/22 1342  BP: 120/67  Pulse: 80  Resp: 18  Temp: (!) 97.4 F (36.3 C)  SpO2: 100%     Body mass index is 29.76 kg/m.    ECOG FS:0 - Asymptomatic  General: Well-developed, well-nourished, no acute distress. Eyes: Pink conjunctiva, anicteric sclera. HEENT: Normocephalic, moist mucous membranes. Lungs: No audible wheezing or coughing. Heart: Regular rate and rhythm. Abdomen: Soft, nontender, no obvious distention. Musculoskeletal: No edema, cyanosis, or clubbing.  Lymphedema sleeve noted on right arm. Neuro: Alert, answering all questions appropriately. Cranial nerves grossly intact. Skin: No rashes or petechiae noted. Psych: Normal affect.   LAB RESULTS:  Lab Results  Component Value Date   NA 136 06/05/2022   K 4.0 06/05/2022   CL 100 06/05/2022   CO2 26 06/05/2022   GLUCOSE 98 06/05/2022   BUN 13 06/05/2022   CREATININE 0.95 06/05/2022   CALCIUM 9.0 06/05/2022   GFRNONAA >60 06/05/2022   GFRAA >60 06/18/2020    Lab Results  Component Value Date   WBC 7.4 06/05/2022   NEUTROABS 3.6 06/05/2022   HGB 12.9 06/05/2022   HCT 39.4 06/05/2022   MCV 86.0 06/05/2022   PLT 302 06/05/2022     STUDIES: No results found.   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  PLAN:    1. 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  Patient underwent palliative mastectomy with bilateral lymph node sampling on May 23, 2020.  Patient's patient's CA 27-29 continues to fluctuate between 41.1 and 68.0.  Today's result is 45.8.  Continue letrozole indefinitely.  We previously discussed the option of adding in chemotherapy with oral Ibrance or Kisqali, but patient is not interested at this time.  Patient initiated monthly Zometa on June 18, 2020.  Proceed with Zometa today.  Return to clinic in 3 months with repeat laboratory work, further evaluation, and continuation of treatment.  Patient is now greater than 2 years from her most recent imaging and can consider repeat PET scan to document continued remission.   2.  Hypocalcemia: Resolved.  Proceed with treatment as above. 3.  Anxiety: Significantly improved.  Continue Xanax as needed. 4.  Genetic testing: Patient has declined. 5.  Right arm lymphedema: Improved.  No evidence of DVT.  Continue sleeve and wrap as per lymphedema clinic instructions. 6.  Hypertension: Patient's blood pressure continues to be within normal limits.  I spent a total of 30 minutes reviewing chart data, face-to-face evaluation with the patient, counseling and coordination of care as detailed above.    Patient expressed understanding and was in agreement with this plan. She also understands that She can call clinic at any time with any questions, concerns, or complaints.    Cancer Staging  Breast cancer, right [facility]) Staging form: Breast, AJCC 8th Edition - Clinical: Stage IV (cT4b, cN2, cM1, ER+, PR+, HER2-) - Signed by Lloyd Huger, MD on 05/17/2020 Stage prefix: Initial diagnosis   Lloyd Huger, MD   06/08/2022 7:27 AM

## 2022-06-05 ENCOUNTER — Encounter: Payer: Self-pay | Admitting: Oncology

## 2022-06-05 ENCOUNTER — Inpatient Hospital Stay (HOSPITAL_BASED_OUTPATIENT_CLINIC_OR_DEPARTMENT_OTHER): Payer: Medicare PPO | Admitting: Oncology

## 2022-06-05 ENCOUNTER — Inpatient Hospital Stay: Payer: Medicare PPO

## 2022-06-05 ENCOUNTER — Inpatient Hospital Stay: Payer: Medicare PPO | Attending: Oncology

## 2022-06-05 VITALS — BP 120/67 | HR 80 | Temp 97.4°F | Resp 18 | Wt 162.7 lb

## 2022-06-05 DIAGNOSIS — C50911 Malignant neoplasm of unspecified site of right female breast: Secondary | ICD-10-CM | POA: Insufficient documentation

## 2022-06-05 DIAGNOSIS — Z170 Estrogen receptor positive status [ER+]: Secondary | ICD-10-CM | POA: Insufficient documentation

## 2022-06-05 DIAGNOSIS — C7951 Secondary malignant neoplasm of bone: Secondary | ICD-10-CM | POA: Insufficient documentation

## 2022-06-05 DIAGNOSIS — Z79899 Other long term (current) drug therapy: Secondary | ICD-10-CM | POA: Insufficient documentation

## 2022-06-05 DIAGNOSIS — Z79811 Long term (current) use of aromatase inhibitors: Secondary | ICD-10-CM | POA: Insufficient documentation

## 2022-06-05 LAB — BASIC METABOLIC PANEL
Anion gap: 10 (ref 5–15)
BUN: 13 mg/dL (ref 8–23)
CO2: 26 mmol/L (ref 22–32)
Calcium: 9 mg/dL (ref 8.9–10.3)
Chloride: 100 mmol/L (ref 98–111)
Creatinine, Ser: 0.95 mg/dL (ref 0.44–1.00)
GFR, Estimated: >60 mL/min (ref >60)
Glucose, Bld: 98 mg/dL (ref 70–99)
Potassium: 4 mmol/L (ref 3.5–5.1)
Sodium: 136 mmol/L (ref 135–145)

## 2022-06-05 LAB — CBC WITH DIFFERENTIAL/PLATELET
Abs Immature Granulocytes: 0.02 10*3/uL (ref 0.00–0.07)
Basophils Absolute: 0 10*3/uL (ref 0.0–0.1)
Basophils Relative: 0 %
Eosinophils Absolute: 0 10*3/uL (ref 0.0–0.5)
Eosinophils Relative: 0 %
HCT: 39.4 % (ref 36.0–46.0)
Hemoglobin: 12.9 g/dL (ref 12.0–15.0)
Immature Granulocytes: 0 %
Lymphocytes Relative: 44 %
Lymphs Abs: 3.3 10*3/uL (ref 0.7–4.0)
MCH: 28.2 pg (ref 26.0–34.0)
MCHC: 32.7 g/dL (ref 30.0–36.0)
MCV: 86 fL (ref 80.0–100.0)
Monocytes Absolute: 0.4 10*3/uL (ref 0.1–1.0)
Monocytes Relative: 6 %
Neutro Abs: 3.6 10*3/uL (ref 1.7–7.7)
Neutrophils Relative %: 50 %
Platelets: 302 10*3/uL (ref 150–400)
RBC: 4.58 MIL/uL (ref 3.87–5.11)
RDW: 13.7 % (ref 11.5–15.5)
WBC: 7.4 10*3/uL (ref 4.0–10.5)
nRBC: 0 % (ref 0.0–0.2)

## 2022-06-05 MED ORDER — SODIUM CHLORIDE 0.9 % IV SOLN
INTRAVENOUS | Status: DC | PRN
  Filled 2022-06-05: qty 250

## 2022-06-05 MED ORDER — ZOLEDRONIC ACID 4 MG/100ML IV SOLN
4.0000 mg | Freq: Once | INTRAVENOUS | Status: AC
  Administered 2022-06-05: 4 mg via INTRAVENOUS
  Filled 2022-06-05: qty 100

## 2022-06-06 LAB — CANCER ANTIGEN 27.29: CA 27.29: 45.8 U/mL — ABNORMAL HIGH (ref 0.0–38.6)

## 2022-06-08 ENCOUNTER — Encounter: Payer: Self-pay | Admitting: Oncology

## 2022-08-22 IMAGING — US US EXTREM  UP VENOUS*R*
1 series · 13 of 24 positions shown · non-contrast
Comparison: None.

CLINICAL DATA: Right upper extremity swelling. History of prior
mastectomy.



[Series 1: us venous img upper uni right (dvt) · portal-venous · 13 of 34 slices shown]
[im 1/34]
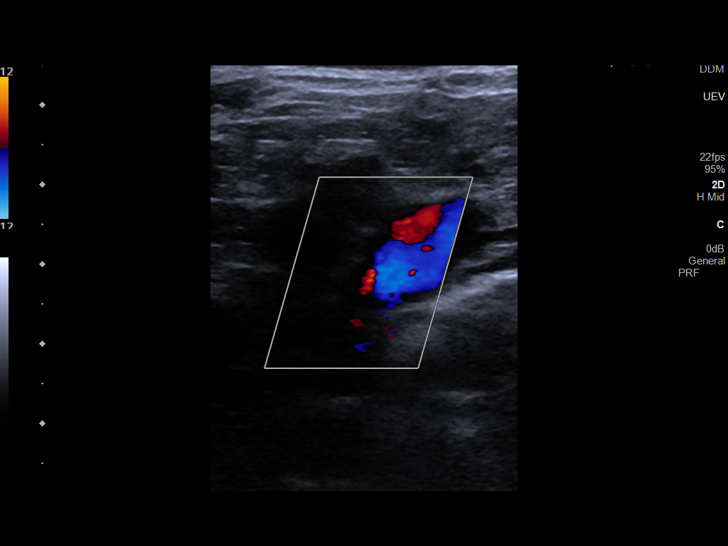
[im 3/34]
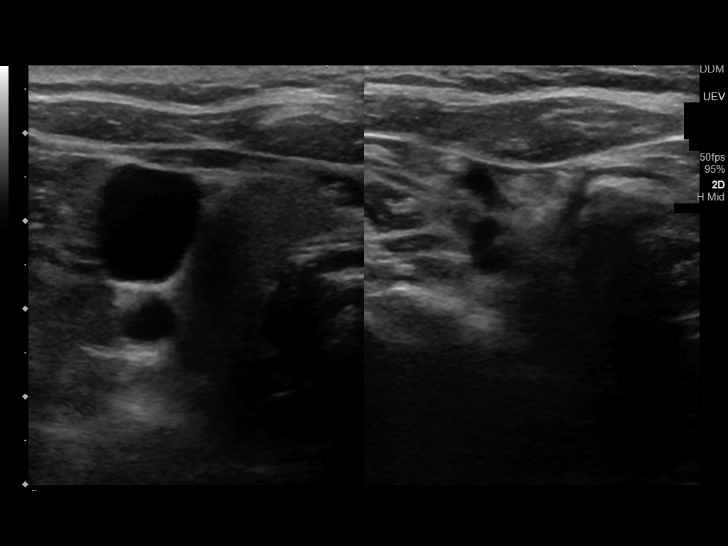
[im 6/34]
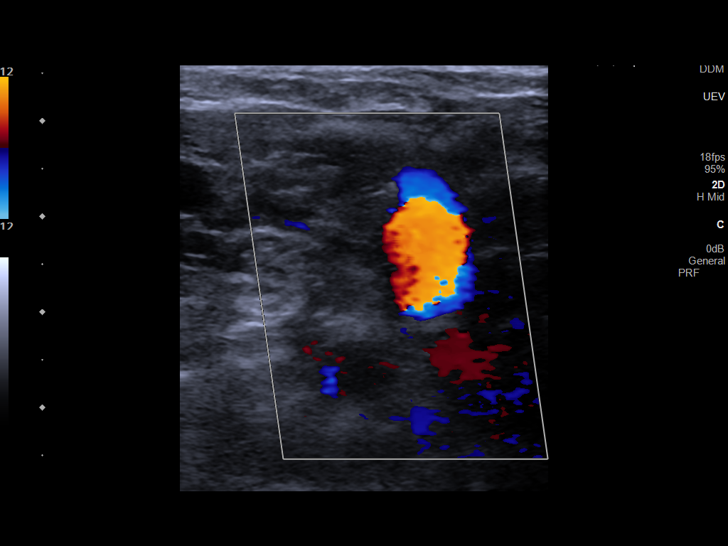
[im 9/34]
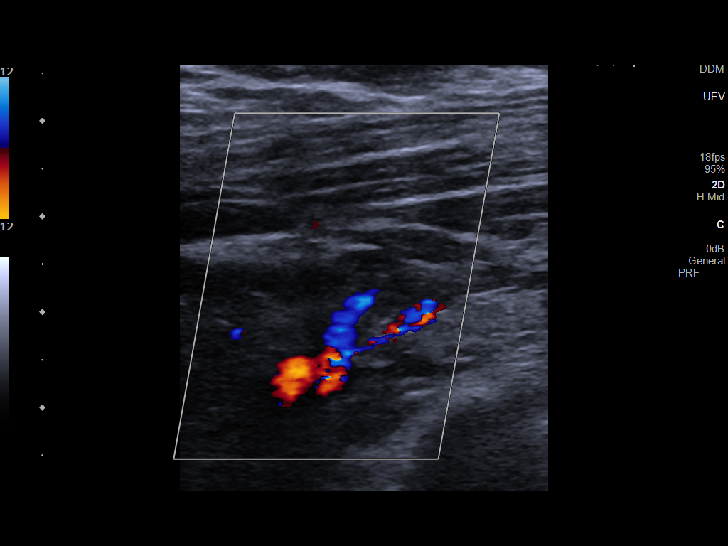
[im 12/34]
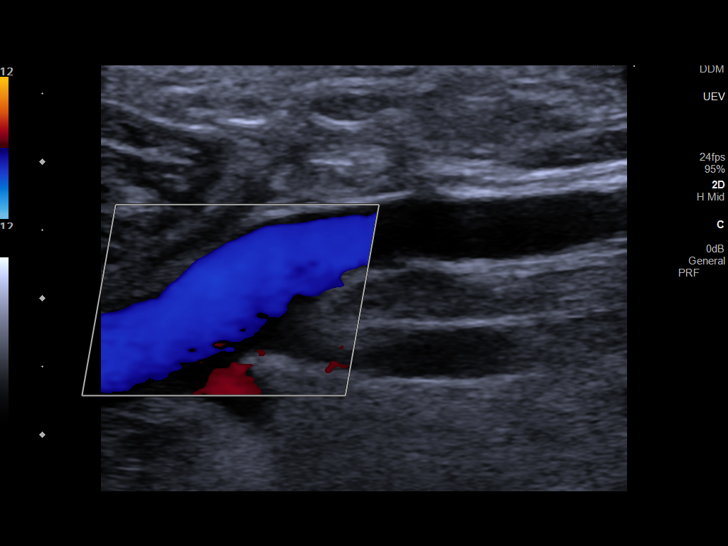
[im 15/34]
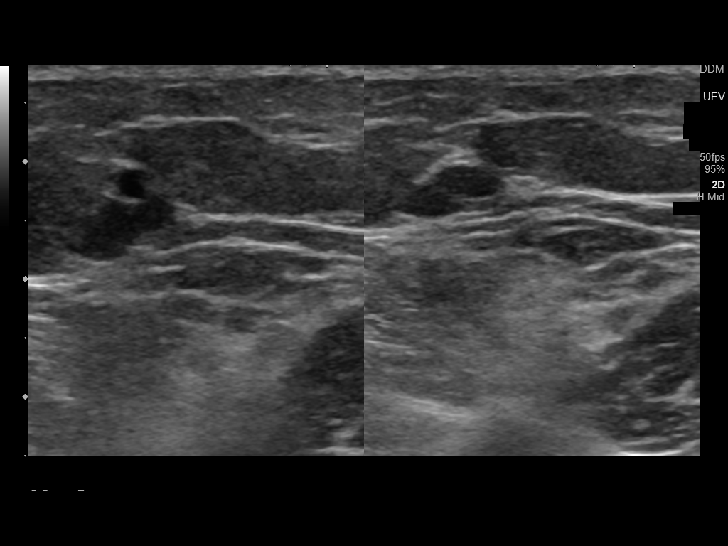
[im 18/34]
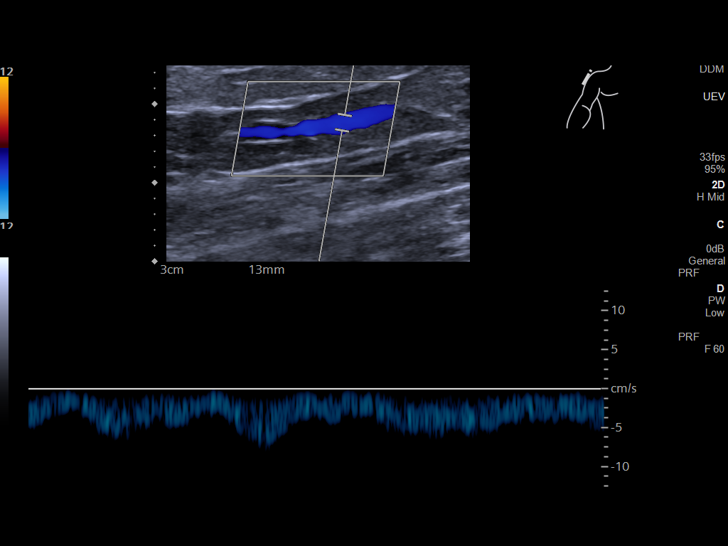
[im 19/34]
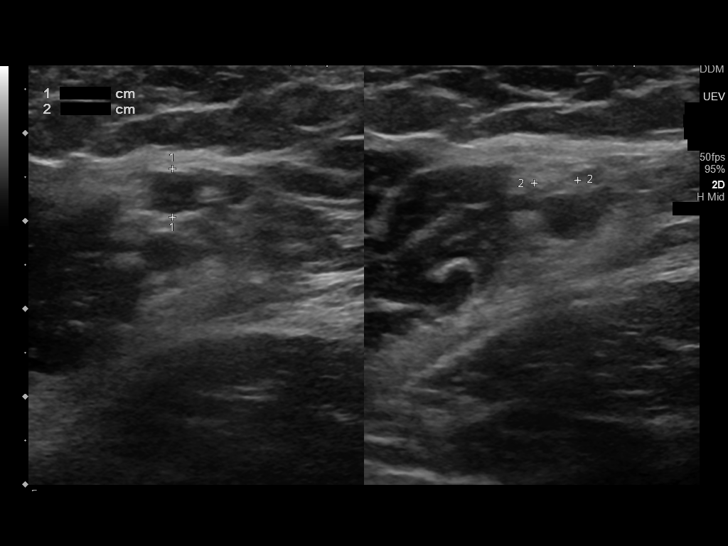
[im 22/34]
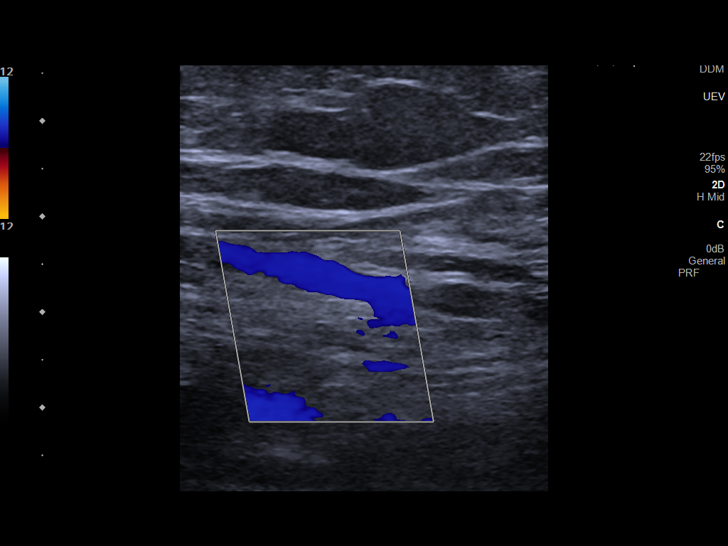
[im 25/34]
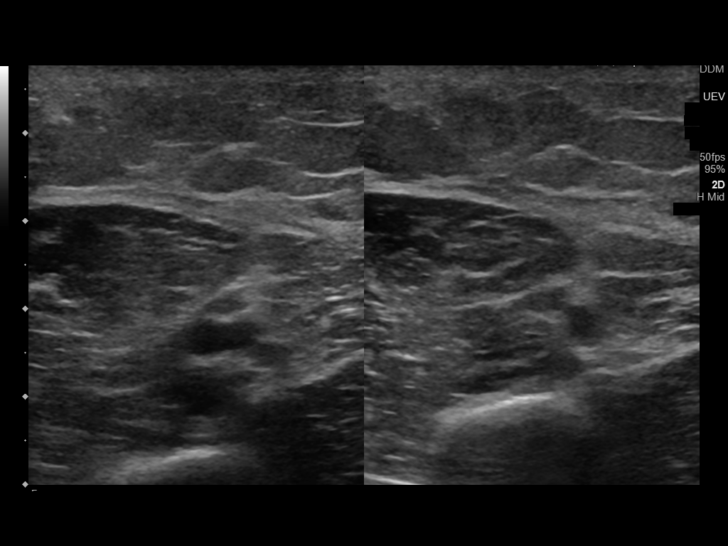
[im 28/34]
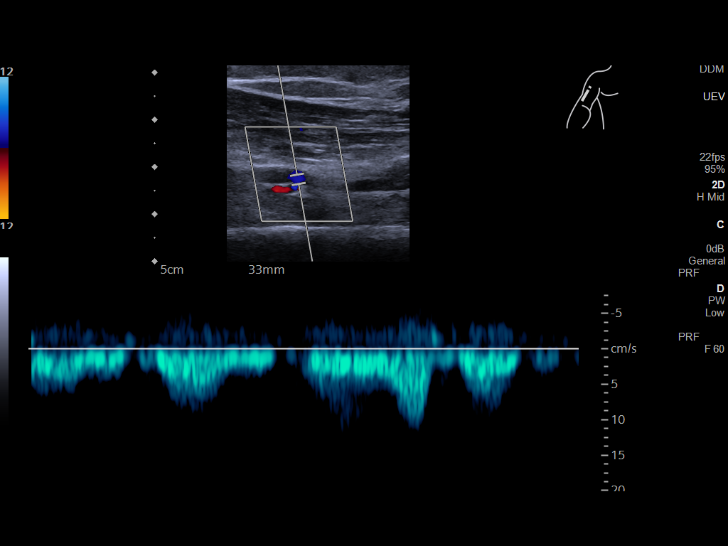
[im 31/34]
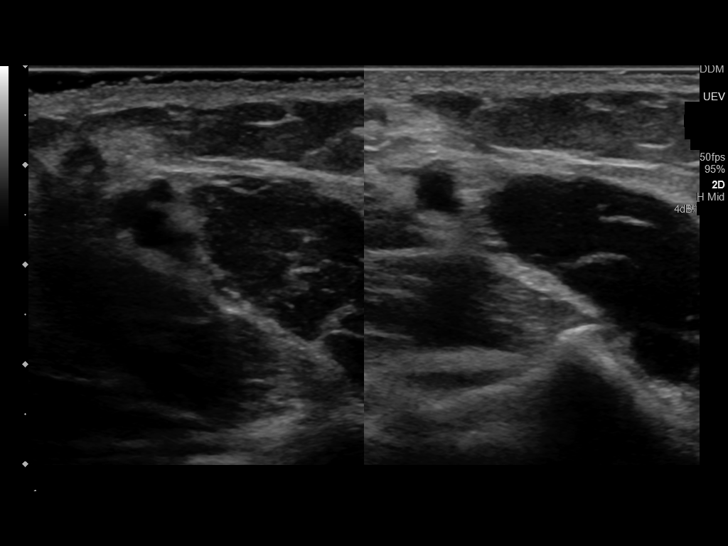
[im 34/34]
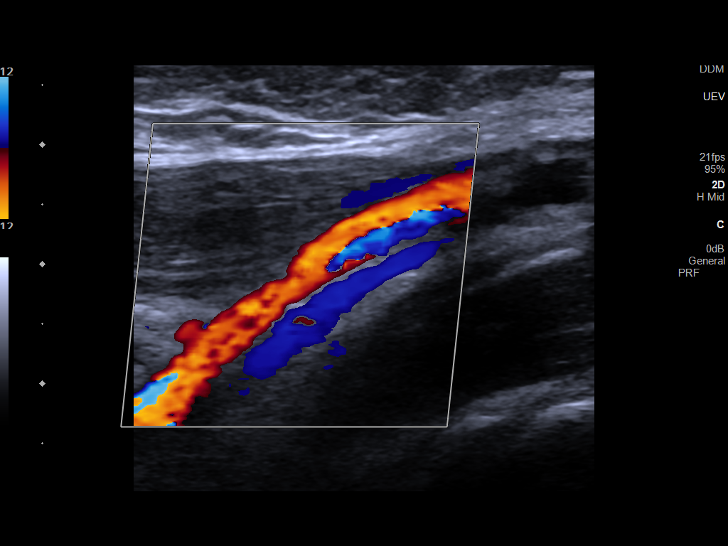

[13 of 24 positions shown; findings below may reference images not displayed]

FINDINGS: Contralateral Subclavian Vein: Respiratory phasicity is normal and
symmetric with the symptomatic side. No evidence of thrombus. Normal
compressibility.

Internal Jugular Vein: No evidence of thrombus. Normal
compressibility, respiratory phasicity and response to augmentation.

Subclavian Vein: No evidence of thrombus. Normal compressibility,
respiratory phasicity and response to augmentation.

Axillary Vein: No evidence of thrombus. Normal compressibility,
respiratory phasicity and response to augmentation.

Cephalic Vein: No evidence of thrombus. Normal compressibility,
respiratory phasicity and response to augmentation.

Basilic Vein: No evidence of thrombus. Normal compressibility,
respiratory phasicity and response to augmentation.

Brachial Veins: No evidence of thrombus. Normal compressibility,
respiratory phasicity and response to augmentation.

Radial Veins: No evidence of thrombus. Normal compressibility,
respiratory phasicity and response to augmentation.

Ulnar Veins: No evidence of thrombus. Normal compressibility,
respiratory phasicity and response to augmentation.

Venous Reflux:  None visualized.

Other Findings:  None visualized.
IMPRESSION: No evidence of DVT within the right upper extremity.

## 2022-09-04 ENCOUNTER — Inpatient Hospital Stay: Payer: Medicare PPO | Attending: Oncology

## 2022-09-04 ENCOUNTER — Encounter: Payer: Self-pay | Admitting: Nurse Practitioner

## 2022-09-04 ENCOUNTER — Inpatient Hospital Stay: Payer: Medicare PPO

## 2022-09-04 ENCOUNTER — Inpatient Hospital Stay (HOSPITAL_BASED_OUTPATIENT_CLINIC_OR_DEPARTMENT_OTHER): Payer: Medicare PPO | Admitting: Nurse Practitioner

## 2022-09-04 VITALS — BP 142/72 | HR 93 | Temp 97.1°F | Ht 62.0 in | Wt 163.0 lb

## 2022-09-04 DIAGNOSIS — C50911 Malignant neoplasm of unspecified site of right female breast: Secondary | ICD-10-CM

## 2022-09-04 DIAGNOSIS — Z170 Estrogen receptor positive status [ER+]: Secondary | ICD-10-CM | POA: Diagnosis not present

## 2022-09-04 DIAGNOSIS — Z7983 Long term (current) use of bisphosphonates: Secondary | ICD-10-CM

## 2022-09-04 DIAGNOSIS — Z5181 Encounter for therapeutic drug level monitoring: Secondary | ICD-10-CM

## 2022-09-04 DIAGNOSIS — C7951 Secondary malignant neoplasm of bone: Secondary | ICD-10-CM | POA: Insufficient documentation

## 2022-09-04 DIAGNOSIS — Z95828 Presence of other vascular implants and grafts: Secondary | ICD-10-CM | POA: Diagnosis not present

## 2022-09-04 LAB — CBC WITH DIFFERENTIAL/PLATELET
Abs Immature Granulocytes: 0.03 10*3/uL (ref 0.00–0.07)
Basophils Absolute: 0 10*3/uL (ref 0.0–0.1)
Basophils Relative: 0 %
Eosinophils Absolute: 0 10*3/uL (ref 0.0–0.5)
Eosinophils Relative: 0 %
HCT: 39.5 % (ref 36.0–46.0)
Hemoglobin: 12.7 g/dL (ref 12.0–15.0)
Immature Granulocytes: 0 %
Lymphocytes Relative: 40 %
Lymphs Abs: 2.8 10*3/uL (ref 0.7–4.0)
MCH: 27.7 pg (ref 26.0–34.0)
MCHC: 32.2 g/dL (ref 30.0–36.0)
MCV: 86.2 fL (ref 80.0–100.0)
Monocytes Absolute: 0.5 10*3/uL (ref 0.1–1.0)
Monocytes Relative: 6 %
Neutro Abs: 3.8 10*3/uL (ref 1.7–7.7)
Neutrophils Relative %: 54 %
Platelets: 332 10*3/uL (ref 150–400)
RBC: 4.58 MIL/uL (ref 3.87–5.11)
RDW: 13.8 % (ref 11.5–15.5)
WBC: 7.2 10*3/uL (ref 4.0–10.5)
nRBC: 0 % (ref 0.0–0.2)

## 2022-09-04 LAB — BASIC METABOLIC PANEL
Anion gap: 9 (ref 5–15)
BUN: 10 mg/dL (ref 8–23)
CO2: 26 mmol/L (ref 22–32)
Calcium: 9 mg/dL (ref 8.9–10.3)
Chloride: 104 mmol/L (ref 98–111)
Creatinine, Ser: 0.82 mg/dL (ref 0.44–1.00)
GFR, Estimated: >60 mL/min (ref >60)
Glucose, Bld: 107 mg/dL — ABNORMAL HIGH (ref 70–99)
Potassium: 3.8 mmol/L (ref 3.5–5.1)
Sodium: 139 mmol/L (ref 135–145)

## 2022-09-04 MED ORDER — HEPARIN SOD (PORK) LOCK FLUSH 100 UNIT/ML IV SOLN
500.0000 [IU] | Freq: Once | INTRAVENOUS | Status: AC
  Administered 2022-09-04: 500 [IU] via INTRAVENOUS
  Filled 2022-09-04: qty 5

## 2022-09-04 MED ORDER — LIDOCAINE-PRILOCAINE 2.5-2.5 % EX CREA: 1.0000 | TOPICAL_CREAM | CUTANEOUS | 3 refills | Status: DC

## 2022-09-04 MED ORDER — ZOLEDRONIC ACID 4 MG/100ML IV SOLN
4.0000 mg | Freq: Once | INTRAVENOUS | Status: AC
  Administered 2022-09-04: 4 mg via INTRAVENOUS
  Filled 2022-09-04: qty 100

## 2022-09-04 MED ORDER — SODIUM CHLORIDE 0.9 % IV SOLN
INTRAVENOUS | Status: DC
  Filled 2022-09-04: qty 250

## 2022-09-04 NOTE — Progress Notes (Signed)
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  INTERVAL HISTORY: Patient returns to clinic today for routine 63-monthevaluation and continuation of Zometa.  She continues to feel at baseline and remains asymptomatic of progression. She has chronic aches and pains which are unchanged. Tolerates letrozole well without significant side effects. She has no neurologic complaints. She denies any recent fevers or illnesses.  She has a good appetite. She endorses weight loss related to improving her diet and reducing sugar intake. She has no chest pain, shortness of breath, cough, or hemoptysis.  She denies any nausea, vomiting, constipation, or diarrhea.  She has no urinary complaints.  Patient offers no specific complaints today.  REVIEW OF SYSTEMS:   Review of Systems  Constitutional: Negative.  Negative for fever, malaise/fatigue and weight loss.  Respiratory: Negative.  Negative for cough, hemoptysis and shortness of breath.   Cardiovascular: Negative.  Negative for chest pain and leg swelling.  Gastrointestinal: Negative.  Negative for abdominal pain.  Genitourinary: Negative.  Negative for dysuria.  Musculoskeletal: Negative.  Negative for back pain and joint pain.  Skin: Negative.  Negative for rash.  Neurological: Negative.  Negative for dizziness, focal weakness, weakness and headaches.  Psychiatric/Behavioral: Negative.  The patient is not nervous/anxious.   As per HPI. Otherwise, a complete review of systems is negative.  PAST MEDICAL HISTORY: Past Medical History:   Diagnosis Date   Anxiety    Arthritis    Diet-controlled diabetes mellitus ([place]    borderline   Fatigue    HLD (hyperlipidemia)    [place] 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; ER/PR (+), HER2/neu (-)   Metastasis to bone (HCC)    Breast primary   Murmur, cardiac    Peripheral edema    T wave inversion on electrocardiogram 05/17/2020   leads I, II, III, aVF, V3-V6    PAST SURGICAL HISTORY: Past Surgical History:  Procedure Laterality Date   BREAST BIOPSY Right 05/02/2020   [place], Q clip, path pending   BREAST BIOPSY Right 05/02/2020   [place] Bx, path pending   CHOLECYSTECTOMY     COLONOSCOPY     LAPAROSCOPIC ABDOMINAL EXPLORATION     MASTECTOMY MODIFIED RADICAL Right 05/23/2020   Procedure: MASTECTOMY MODIFIED RADICAL, WITH EXCISION OF AXILLARY CONTENTS;  Surgeon: BRobert Bellow MD;  Location: [facility] ORS;  Service: General;  Laterality: Right;   PORTACATH PLACEMENT Left 05/23/2020   Procedure: INSERTION PORT-A-CATH;  Surgeon: BRobert Bellow MD;  Location: [facility] ORS;  Service: General;  Laterality: Left;    FAMILY HISTORY: Family History  Problem Relation Age of Onset   Breast cancer Sister     ADVANCED DIRECTIVES (Y/N):  N  HEALTH MAINTENANCE: Social History   Tobacco Use   Smoking status: Never   Smokeless tobacco: Never  Vaping Use   Vaping Use: Never used  Substance Use Topics   Alcohol use: Never   Drug use: Never     Colonoscopy:  PAP:  Bone density:  Lipid panel:  Allergies  Allergen Reactions   Nickel     Current Outpatient Medications  Medication Sig Dispense Refill  Calcium Carbonate-Vitamin D 600-400 MG-UNIT tablet Take by mouth.     letrozole (FEMARA) 2.5 MG tablet TAKE 1 TABLET(2.5 MG) BY MOUTH DAILY 90 tablet 3   lisinopril (ZESTRIL) 5 MG tablet Take by mouth.     lisinopril (ZESTRIL) 5 MG tablet Take 1 tablet by mouth at bedtime.     lovastatin (MEVACOR) 40 MG tablet Take 1 tablet by mouth at bedtime.      metFORMIN (GLUCOPHAGE) 500 MG tablet Take 500 mg by mouth 2 (two) times daily with a meal.     metoprolol succinate (TOPROL-XL) 25 MG 24 hr tablet Take 25 mg by mouth daily.     lidocaine-prilocaine (EMLA) cream Apply 1 Application topically See admin instructions. APPLY A SMALL AMOUNT TO PORT SITE AT LEAST 1 HOUR PRIOR TO IT BEING ACCESSED THEN COVER WITH PLASTIC WRAP 30 g 3   lovastatin (MEVACOR) 40 MG tablet Take 40 mg by mouth daily.     No current facility-administered medications for this visit.   Facility-Administered Medications Ordered in Other Visits  Medication Dose Route Frequency Provider Last Rate Last Admin   0.9 %  sodium chloride infusion   Intravenous Continuous Lloyd Huger, MD 20 mL/hr at 09/04/22 1158 New Bag at 09/04/22 1158   heparin lock flush 100 unit/mL  500 Units Intravenous Once Lloyd Huger, MD       Zoledronic Acid (ZOMETA) IVPB 4 mg  4 mg Intravenous Once Lloyd Huger, MD        OBJECTIVE: Vitals:   09/04/22 1105  BP: (!) 142/72  Pulse: 93  Temp: (!) 97.1 F (36.2 C)     Body mass index is 29.81 kg/m.    ECOG FS:0 - Asymptomatic  General: Well-developed, well-nourished, no acute distress. Accompanied by son. Eyes: Pink conjunctiva, anicteric sclera. Lungs: No audible wheezing or coughing Heart: Regular rate and rhythm.  Abdomen: Soft, nontender, nondistended.  Musculoskeletal: No edema, cyanosis, or clubbing. Cane for ambulation Neuro: Alert, answering all questions appropriately. Cranial nerves grossly intact. Skin: No rashes or petechiae noted. Psych: Normal affect.    LAB RESULTS: Lab Results  Component Value Date   NA 139 09/04/2022   K 3.8 09/04/2022   CL 104 09/04/2022   CO2 26 09/04/2022   GLUCOSE 107 (H) 09/04/2022   BUN 10 09/04/2022   CREATININE 0.82 09/04/2022   CALCIUM 9.0 09/04/2022   GFRNONAA >60 09/04/2022   GFRAA >60 06/18/2020    Lab Results  Component Value Date   WBC 7.2 09/04/2022   NEUTROABS  3.8 09/04/2022   HGB 12.7 09/04/2022   HCT 39.5 09/04/2022   MCV 86.2 09/04/2022   PLT 332 09/04/2022     STUDIES: No results found.   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  PLAN:    1. 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 She underwent palliative mastectomy with bilateral LN biopsies on 05/23/20 followed by letrozole. Previously, discussion of adding chemotherapy such as ibrance or kisquali was discussed but patient declined. She initiated monthly zometa 06/18/20. CA 27.29 was not checked pre-operatively but was elevated in 08/2020 at 69.6. It has fluctuated but more recently in 52s. She continues letrozole and zometa. Tolerating well. Clinically is stable. Today's result  is pending. We discussed repeating imaging including possible PET but she declines. Prefers to monitor conservatively given that she is asymptomatic. Labs reviewed and acceptable for zometa today. Proceed with zometa. Continue letrozole.  2. Hypocalcemia- resolved. Ca 9.0.  3. Anxiety- improved. Continue xanax as prescribed.  4. Genetic testing- patient declined.  5. 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 Previous imaging 01/01/21 was negative for DVT. Continue compression sleeve and wrapping for lymphedema clinic recommendations.  6.  Hypertension: BP slightly elevated in clinic. Encouraged her to monitor at home and follow up with pcp.  7. Port-a-cath: functioning appropriately. Refilled emla cream today.   Disposition: Zometa today 6 weeks- port flush 3 mo- lab (cbc, cmp, ca27.29), Finnegan or me, +/- zometa- la   I spent a total of 30 minutes reviewing chart data,  face-to-face evaluation with the patient, counseling and coordination of care as detailed above.  Patient expressed understanding and was in agreement with this plan. She also understands that She can call clinic at any time with any questions, concerns, or complaints.    Cancer Staging  Breast cancer, right [facility]) Staging form: Breast, AJCC 8th Edition - Clinical: Stage IV (cT4b, cN2, cM1, ER+, PR+, HER2-) - Signed by Lloyd Huger, MD on 05/17/2020 Stage prefix: Initial diagnosis  Verlon Au, NP  09/04/2022

## 2022-09-05 LAB — CANCER ANTIGEN 27.29: CA 27.29: 41.7 U/mL — ABNORMAL HIGH (ref 0.0–38.6)

## 2022-10-16 ENCOUNTER — Inpatient Hospital Stay: Payer: Medicare PPO | Attending: Oncology

## 2022-10-16 DIAGNOSIS — C7951 Secondary malignant neoplasm of bone: Secondary | ICD-10-CM | POA: Insufficient documentation

## 2022-10-16 DIAGNOSIS — Z95828 Presence of other vascular implants and grafts: Secondary | ICD-10-CM

## 2022-10-16 DIAGNOSIS — Z170 Estrogen receptor positive status [ER+]: Secondary | ICD-10-CM | POA: Diagnosis not present

## 2022-10-16 DIAGNOSIS — C50911 Malignant neoplasm of unspecified site of right female breast: Secondary | ICD-10-CM | POA: Diagnosis not present

## 2022-10-16 LAB — BASIC METABOLIC PANEL
Anion gap: 8 (ref 5–15)
BUN: 13 mg/dL (ref 8–23)
CO2: 25 mmol/L (ref 22–32)
Calcium: 8.6 mg/dL — ABNORMAL LOW (ref 8.9–10.3)
Chloride: 102 mmol/L (ref 98–111)
Creatinine, Ser: 0.79 mg/dL (ref 0.44–1.00)
GFR, Estimated: >60 mL/min (ref >60)
Glucose, Bld: 127 mg/dL — ABNORMAL HIGH (ref 70–99)
Potassium: 3.8 mmol/L (ref 3.5–5.1)
Sodium: 135 mmol/L (ref 135–145)

## 2022-10-16 LAB — CBC WITH DIFFERENTIAL/PLATELET
Abs Immature Granulocytes: 0.02 10*3/uL (ref 0.00–0.07)
Basophils Absolute: 0 10*3/uL (ref 0.0–0.1)
Basophils Relative: 0 %
Eosinophils Absolute: 0 10*3/uL (ref 0.0–0.5)
Eosinophils Relative: 0 %
HCT: 40.5 % (ref 36.0–46.0)
Hemoglobin: 13.5 g/dL (ref 12.0–15.0)
Immature Granulocytes: 0 %
Lymphocytes Relative: 35 %
Lymphs Abs: 2.8 10*3/uL (ref 0.7–4.0)
MCH: 28.4 pg (ref 26.0–34.0)
MCHC: 33.3 g/dL (ref 30.0–36.0)
MCV: 85.1 fL (ref 80.0–100.0)
Monocytes Absolute: 0.5 10*3/uL (ref 0.1–1.0)
Monocytes Relative: 7 %
Neutro Abs: 4.7 10*3/uL (ref 1.7–7.7)
Neutrophils Relative %: 58 %
Platelets: 367 10*3/uL (ref 150–400)
RBC: 4.76 MIL/uL (ref 3.87–5.11)
RDW: 13.9 % (ref 11.5–15.5)
WBC: 8.1 10*3/uL (ref 4.0–10.5)
nRBC: 0 % (ref 0.0–0.2)

## 2022-10-16 MED ORDER — SODIUM CHLORIDE 0.9% FLUSH
10.0000 mL | Freq: Once | INTRAVENOUS | Status: AC
  Administered 2022-10-16: 10 mL via INTRAVENOUS
  Filled 2022-10-16: qty 10

## 2022-10-16 MED ORDER — HEPARIN SOD (PORK) LOCK FLUSH 100 UNIT/ML IV SOLN
500.0000 [IU] | Freq: Once | INTRAVENOUS | Status: AC
  Administered 2022-10-16: 500 [IU] via INTRAVENOUS
  Filled 2022-10-16: qty 5

## 2022-10-17 LAB — CANCER ANTIGEN 27.29: CA 27.29: 39.3 U/mL — ABNORMAL HIGH (ref 0.0–38.6)

## 2022-12-04 ENCOUNTER — Inpatient Hospital Stay (HOSPITAL_BASED_OUTPATIENT_CLINIC_OR_DEPARTMENT_OTHER): Payer: Medicare PPO | Admitting: Nurse Practitioner

## 2022-12-04 ENCOUNTER — Inpatient Hospital Stay: Payer: Medicare PPO | Attending: Oncology

## 2022-12-04 ENCOUNTER — Inpatient Hospital Stay: Payer: Medicare PPO

## 2022-12-04 ENCOUNTER — Other Ambulatory Visit: Payer: Self-pay

## 2022-12-04 ENCOUNTER — Encounter: Payer: Self-pay | Admitting: Nurse Practitioner

## 2022-12-04 VITALS — BP 114/60 | HR 79 | Temp 96.7°F | Resp 20 | Ht 62.0 in | Wt 166.0 lb

## 2022-12-04 DIAGNOSIS — C50911 Malignant neoplasm of unspecified site of right female breast: Secondary | ICD-10-CM

## 2022-12-04 DIAGNOSIS — Z5181 Encounter for therapeutic drug level monitoring: Secondary | ICD-10-CM

## 2022-12-04 DIAGNOSIS — Z79811 Long term (current) use of aromatase inhibitors: Secondary | ICD-10-CM

## 2022-12-04 DIAGNOSIS — Z170 Estrogen receptor positive status [ER+]: Secondary | ICD-10-CM

## 2022-12-04 DIAGNOSIS — Z95828 Presence of other vascular implants and grafts: Secondary | ICD-10-CM | POA: Diagnosis not present

## 2022-12-04 DIAGNOSIS — C7951 Secondary malignant neoplasm of bone: Secondary | ICD-10-CM | POA: Diagnosis present

## 2022-12-04 DIAGNOSIS — Z7983 Long term (current) use of bisphosphonates: Secondary | ICD-10-CM

## 2022-12-04 LAB — CBC WITH DIFFERENTIAL/PLATELET
Abs Immature Granulocytes: 0.02 10*3/uL (ref 0.00–0.07)
Basophils Absolute: 0 10*3/uL (ref 0.0–0.1)
Basophils Relative: 0 %
Eosinophils Absolute: 0 10*3/uL (ref 0.0–0.5)
Eosinophils Relative: 0 %
HCT: 40.3 % (ref 36.0–46.0)
Hemoglobin: 13.1 g/dL (ref 12.0–15.0)
Immature Granulocytes: 0 %
Lymphocytes Relative: 30 %
Lymphs Abs: 2.5 10*3/uL (ref 0.7–4.0)
MCH: 27.9 pg (ref 26.0–34.0)
MCHC: 32.5 g/dL (ref 30.0–36.0)
MCV: 85.9 fL (ref 80.0–100.0)
Monocytes Absolute: 0.5 10*3/uL (ref 0.1–1.0)
Monocytes Relative: 6 %
Neutro Abs: 5.4 10*3/uL (ref 1.7–7.7)
Neutrophils Relative %: 64 %
Platelets: 350 10*3/uL (ref 150–400)
RBC: 4.69 MIL/uL (ref 3.87–5.11)
RDW: 14 % (ref 11.5–15.5)
WBC: 8.4 10*3/uL (ref 4.0–10.5)
nRBC: 0 % (ref 0.0–0.2)

## 2022-12-04 LAB — BASIC METABOLIC PANEL
Anion gap: 9 (ref 5–15)
BUN: 12 mg/dL (ref 8–23)
CO2: 26 mmol/L (ref 22–32)
Calcium: 9 mg/dL (ref 8.9–10.3)
Chloride: 102 mmol/L (ref 98–111)
Creatinine, Ser: 0.87 mg/dL (ref 0.44–1.00)
GFR, Estimated: >60 mL/min (ref >60)
Glucose, Bld: 107 mg/dL — ABNORMAL HIGH (ref 70–99)
Potassium: 4 mmol/L (ref 3.5–5.1)
Sodium: 137 mmol/L (ref 135–145)

## 2022-12-04 MED ORDER — HEPARIN SOD (PORK) LOCK FLUSH 100 UNIT/ML IV SOLN
500.0000 [IU] | Freq: Once | INTRAVENOUS | Status: AC
  Administered 2022-12-04: 500 [IU] via INTRAVENOUS
  Filled 2022-12-04: qty 5

## 2022-12-04 MED ORDER — ZOLEDRONIC ACID 4 MG/100ML IV SOLN
4.0000 mg | Freq: Once | INTRAVENOUS | Status: AC
  Administered 2022-12-04: 4 mg via INTRAVENOUS
  Filled 2022-12-04: qty 100

## 2022-12-04 MED ORDER — LIDOCAINE-PRILOCAINE 2.5-2.5 % EX CREA: 1.0000 | TOPICAL_CREAM | CUTANEOUS | 3 refills | Status: DC

## 2022-12-04 MED ORDER — SODIUM CHLORIDE 0.9 % IV SOLN
Freq: Once | INTRAVENOUS | Status: AC
  Filled 2022-12-04: qty 250

## 2022-12-04 NOTE — Progress Notes (Signed)
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  INTERVAL HISTORY: Patient returns to clinic today for routine 13-monthevaluation and continuation of Zometa.  She continues to feel at baseline and remains asymptomatic of progression. She has chronic aches and pains which are unchanged. Tolerates letrozole well without significant side effects. She has no neurologic complaints. She denies any recent fevers or illnesses.  She has a good appetite. She has no chest pain, shortness of breath, cough, or hemoptysis.  She denies any nausea, vomiting, constipation, or diarrhea.  She has no urinary complaints.  Patient offers no specific complaints today.  REVIEW OF SYSTEMS:   Review of Systems  Constitutional: Negative.  Negative for fever, malaise/fatigue and weight loss.  Respiratory: Negative.  Negative for cough, hemoptysis and shortness of breath.   Cardiovascular: Negative.  Negative for chest pain and leg swelling.  Gastrointestinal: Negative.  Negative for abdominal pain.  Genitourinary: Negative.  Negative for dysuria.  Musculoskeletal: Negative.  Negative for back pain and joint pain.  Skin: Negative.  Negative for rash.  Neurological: Negative.  Negative for dizziness, focal weakness, weakness and headaches.  Psychiatric/Behavioral: Negative.  The patient is not nervous/anxious.   As per HPI. Otherwise, a complete review of systems is negative.  PAST MEDICAL HISTORY: Past Medical History:  Diagnosis Date   Anxiety    Arthritis    Diet-controlled diabetes mellitus ([place]     borderline   Fatigue    HLD (hyperlipidemia)    Invasive carcinoma of breast ([place] 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; ER/PR (+), HER2/neu (-)   Metastasis to bone (HCC)    Breast primary   Murmur, cardiac    Peripheral edema    T wave inversion on electrocardiogram 05/17/2020   leads I, II, III, aVF, V3-V6    PAST SURGICAL HISTORY: Past Surgical History:  Procedure Laterality Date   BREAST BIOPSY Right 05/02/2020   [place], Q clip, path pending   BREAST BIOPSY Right 05/02/2020   [place] Bx, path pending   CHOLECYSTECTOMY     COLONOSCOPY     LAPAROSCOPIC ABDOMINAL EXPLORATION     MASTECTOMY MODIFIED RADICAL Right 05/23/2020   Procedure: MASTECTOMY MODIFIED RADICAL, WITH EXCISION OF AXILLARY CONTENTS;  Surgeon: BRobert Bellow MD;  Location: [facility] ORS;  Service: General;  Laterality: Right;   PORTACATH PLACEMENT Left 05/23/2020   Procedure: INSERTION PORT-A-CATH;  Surgeon: BRobert Bellow MD;  Location: [facility] ORS;  Service: General;  Laterality: Left;    FAMILY HISTORY: Family History  Problem Relation Age of Onset   Breast cancer Sister     ADVANCED DIRECTIVES (Y/N):  N  HEALTH MAINTENANCE: Social History   Tobacco Use   Smoking status: Never   Smokeless tobacco: Never  Vaping Use   Vaping Use: Never used  Substance Use Topics   Alcohol use: Never   Drug use: Never     Colonoscopy:  PAP:  Bone density:  Lipid panel:  Allergies  Allergen Reactions   Nickel     Current Outpatient Medications  Medication Sig Dispense Refill   Calcium Carbonate-Vitamin D 600-400 MG-UNIT tablet Take by mouth.  letrozole (FEMARA) 2.5 MG tablet TAKE 1 TABLET(2.5 MG) BY MOUTH DAILY 90 tablet 3   lidocaine-prilocaine (EMLA) cream Apply 1 Application topically See admin instructions. APPLY A SMALL AMOUNT TO PORT SITE AT LEAST 1 HOUR PRIOR TO IT BEING ACCESSED THEN COVER WITH PLASTIC WRAP 30 g 3   lisinopril (ZESTRIL) 5 MG tablet Take 1 tablet by mouth at bedtime.     lovastatin  (MEVACOR) 40 MG tablet Take 1 tablet by mouth at bedtime.     metFORMIN (GLUCOPHAGE) 500 MG tablet Take 500 mg by mouth 2 (two) times daily with a meal.     metoprolol succinate (TOPROL-XL) 25 MG 24 hr tablet Take 25 mg by mouth daily.     No current facility-administered medications for this visit.   Facility-Administered Medications Ordered in Other Visits  Medication Dose Route Frequency Provider Last Rate Last Admin   0.9 %  sodium chloride infusion   Intravenous Once Verlon Au, NP       Zoledronic Acid (ZOMETA) IVPB 4 mg  4 mg Intravenous Once Lloyd Huger, MD        OBJECTIVE: Vitals:   12/04/22 1415  BP: 114/60  Pulse: 79  Resp: 20  Temp: (!) 96.7 F (35.9 C)     Body mass index is 30.36 kg/m.    ECOG FS:0 - Asymptomatic  General: Well-developed, well-nourished, no acute distress. Accompanied by son. Eyes: Pink conjunctiva, anicteric sclera. Lungs: No audible wheezing or coughing Heart: Regular rate and rhythm.  Abdomen: Soft, nontender, nondistended.  Musculoskeletal: No edema, cyanosis, or clubbing. Cane for ambulation Neuro: Alert, answering all questions appropriately. Cranial nerves grossly intact. Skin: No rashes or petechiae noted. Psych: Normal affect.    LAB RESULTS: Lab Results  Component Value Date   NA 137 12/04/2022   K 4.0 12/04/2022   CL 102 12/04/2022   CO2 26 12/04/2022   GLUCOSE 107 (H) 12/04/2022   BUN 12 12/04/2022   CREATININE 0.87 12/04/2022   CALCIUM 9.0 12/04/2022   GFRNONAA >60 12/04/2022   GFRAA >60 06/18/2020    Lab Results  Component Value Date   WBC 8.4 12/04/2022   NEUTROABS 5.4 12/04/2022   HGB 13.1 12/04/2022   HCT 40.3 12/04/2022   MCV 85.9 12/04/2022   PLT 350 12/04/2022     STUDIES: No results found.   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  PLAN:    1. Stage IV ER/PR positive, HER-2 negative invasive carcinoma  of the right breast with metastatic lesions to bone: PET scan from 05/16/20 revealed intensely hypermetabolic right breast mass with metastatic lesions in bilateral axillary LN as well as multifocal hypermetabolic bone metastases. She underwent palliative mastectomy with bilateral LN biopsies on 05/23/20 followed by letrozole. Previously, discussion of adding chemotherapy such as ibrance or kisquali was discussed but patient declined. She initiated monthly zometa 06/18/20. CA 27.29 was not checked pre-operatively but was elevated in 08/2020 at 69.6. It has fluctuated but more recently in 25s. She continues letrozole and zometa. Tolerating well. Clinically is stable. Today's result is pending. We discussed repeating imaging including possible PET or mammogram but she declines. Prefers to monitor conservatively given that she is asymptomatic. Labs reviewed and acceptable for zometa today. Proceed with zometa. Continue letrozole.  2. Hypocalcemia- resolved.  3. Anxiety- improved. Continue xanax as prescribed.  4. Genetic testing- patient declined.  5. 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 Previous imaging  01/01/21 was negative for DVT. Continue compression sleeve and wrapping for lymphedema clinic recommendations.  6.  Hypertension: BP slightly elevated in clinic. Encouraged her to monitor at home and follow up with pcp.  7. Port-a-cath: functioning appropriately    Disposition: Zometa today 6 weeks- port flush 3 mo- lab (cbc, cmp, ca27.29), [place], +/- zometa- la   I spent a total of 30 minutes reviewing chart data, face-to-face evaluation with the patient, counseling and coordination of care as detailed above.  Thank you for allowing me to participate in the care of this very pleasant patient.   Patient expressed understanding and was in agreement with this plan. She also understands that She can call clinic at any time with any questions, concerns, or complaints.    Cancer Staging   Breast cancer, right [facility]) Staging form: Breast, AJCC 8th Edition - Clinical: Stage IV (cT4b, cN2, cM1, ER+, PR+, HER2-) - Signed by Lloyd Huger, MD on 05/17/2020 Stage prefix: Initial diagnosis  Verlon Au, NP  12/04/2022

## 2022-12-05 LAB — CANCER ANTIGEN 27.29: CA 27.29: 42.5 U/mL — ABNORMAL HIGH (ref 0.0–38.6)

## 2023-01-15 ENCOUNTER — Other Ambulatory Visit: Payer: Self-pay | Admitting: *Deleted

## 2023-01-15 ENCOUNTER — Inpatient Hospital Stay: Payer: Medicare PPO | Attending: Oncology

## 2023-01-15 DIAGNOSIS — C7951 Secondary malignant neoplasm of bone: Secondary | ICD-10-CM | POA: Diagnosis not present

## 2023-01-15 DIAGNOSIS — C50911 Malignant neoplasm of unspecified site of right female breast: Secondary | ICD-10-CM | POA: Insufficient documentation

## 2023-01-15 DIAGNOSIS — Z452 Encounter for adjustment and management of vascular access device: Secondary | ICD-10-CM | POA: Diagnosis present

## 2023-01-15 DIAGNOSIS — Z170 Estrogen receptor positive status [ER+]: Secondary | ICD-10-CM | POA: Insufficient documentation

## 2023-01-15 DIAGNOSIS — Z95828 Presence of other vascular implants and grafts: Secondary | ICD-10-CM

## 2023-01-15 MED ORDER — HEPARIN SOD (PORK) LOCK FLUSH 100 UNIT/ML IV SOLN
500.0000 [IU] | Freq: Once | INTRAVENOUS | Status: AC
  Administered 2023-01-15: 500 [IU] via INTRAVENOUS
  Filled 2023-01-15: qty 5

## 2023-01-15 MED ORDER — SODIUM CHLORIDE 0.9% FLUSH
10.0000 mL | Freq: Once | INTRAVENOUS | Status: AC
  Administered 2023-01-15: 10 mL via INTRAVENOUS
  Filled 2023-01-15: qty 10

## 2023-01-15 MED ORDER — LIDOCAINE-PRILOCAINE 2.5-2.5 % EX CREA: 1.0000 | TOPICAL_CREAM | CUTANEOUS | 3 refills | Status: DC

## 2023-03-06 ENCOUNTER — Other Ambulatory Visit: Payer: Self-pay | Admitting: *Deleted

## 2023-03-06 DIAGNOSIS — C50911 Malignant neoplasm of unspecified site of right female breast: Secondary | ICD-10-CM

## 2023-03-06 DIAGNOSIS — Z95828 Presence of other vascular implants and grafts: Secondary | ICD-10-CM

## 2023-03-10 ENCOUNTER — Inpatient Hospital Stay: Payer: Medicare PPO | Admitting: Oncology

## 2023-03-10 ENCOUNTER — Inpatient Hospital Stay: Payer: Medicare PPO

## 2023-03-10 ENCOUNTER — Inpatient Hospital Stay: Payer: Medicare PPO | Attending: Oncology

## 2023-04-20 ENCOUNTER — Other Ambulatory Visit: Payer: Self-pay | Admitting: Oncology

## 2023-05-05 ENCOUNTER — Encounter: Payer: Self-pay | Admitting: Oncology

## 2023-05-05 ENCOUNTER — Inpatient Hospital Stay: Payer: Medicare PPO

## 2023-05-05 ENCOUNTER — Inpatient Hospital Stay: Payer: Medicare PPO | Attending: Oncology

## 2023-05-05 ENCOUNTER — Inpatient Hospital Stay (HOSPITAL_BASED_OUTPATIENT_CLINIC_OR_DEPARTMENT_OTHER): Payer: Medicare PPO | Admitting: Oncology

## 2023-05-05 VITALS — BP 125/65 | HR 86 | Temp 98.2°F | Resp 16 | Ht 62.0 in | Wt 168.0 lb

## 2023-05-05 DIAGNOSIS — C50911 Malignant neoplasm of unspecified site of right female breast: Secondary | ICD-10-CM

## 2023-05-05 DIAGNOSIS — Z95828 Presence of other vascular implants and grafts: Secondary | ICD-10-CM

## 2023-05-05 DIAGNOSIS — Z170 Estrogen receptor positive status [ER+]: Secondary | ICD-10-CM

## 2023-05-05 DIAGNOSIS — C7951 Secondary malignant neoplasm of bone: Secondary | ICD-10-CM | POA: Diagnosis present

## 2023-05-05 LAB — CMP (CANCER CENTER ONLY)
ALT: 19 U/L (ref 0–44)
AST: 24 U/L (ref 15–41)
Albumin: 3.8 g/dL (ref 3.5–5.0)
Alkaline Phosphatase: 56 U/L (ref 38–126)
Anion gap: 8 (ref 5–15)
BUN: 12 mg/dL (ref 8–23)
CO2: 24 mmol/L (ref 22–32)
Calcium: 9.1 mg/dL (ref 8.9–10.3)
Chloride: 105 mmol/L (ref 98–111)
Creatinine: 0.98 mg/dL (ref 0.44–1.00)
GFR, Estimated: >60 mL/min (ref >60)
Glucose, Bld: 161 mg/dL — ABNORMAL HIGH (ref 70–99)
Potassium: 3.7 mmol/L (ref 3.5–5.1)
Sodium: 137 mmol/L (ref 135–145)
Total Bilirubin: 0.5 mg/dL (ref 0.3–1.2)
Total Protein: 7.3 g/dL (ref 6.5–8.1)

## 2023-05-05 LAB — CBC WITH DIFFERENTIAL (CANCER CENTER ONLY)
Abs Immature Granulocytes: 0.02 10*3/uL (ref 0.00–0.07)
Basophils Absolute: 0 10*3/uL (ref 0.0–0.1)
Basophils Relative: 0 %
Eosinophils Absolute: 0 10*3/uL (ref 0.0–0.5)
Eosinophils Relative: 0 %
HCT: 38.2 % (ref 36.0–46.0)
Hemoglobin: 12.3 g/dL (ref 12.0–15.0)
Immature Granulocytes: 0 %
Lymphocytes Relative: 36 %
Lymphs Abs: 2.4 10*3/uL (ref 0.7–4.0)
MCH: 27.5 pg (ref 26.0–34.0)
MCHC: 32.2 g/dL (ref 30.0–36.0)
MCV: 85.5 fL (ref 80.0–100.0)
Monocytes Absolute: 0.5 10*3/uL (ref 0.1–1.0)
Monocytes Relative: 8 %
Neutro Abs: 3.7 10*3/uL (ref 1.7–7.7)
Neutrophils Relative %: 56 %
Platelet Count: 293 10*3/uL (ref 150–400)
RBC: 4.47 MIL/uL (ref 3.87–5.11)
RDW: 13.8 % (ref 11.5–15.5)
WBC Count: 6.7 10*3/uL (ref 4.0–10.5)
nRBC: 0 % (ref 0.0–0.2)

## 2023-05-05 MED ORDER — HEPARIN SOD (PORK) LOCK FLUSH 100 UNIT/ML IV SOLN
500.0000 [IU] | Freq: Once | INTRAVENOUS | Status: AC
  Administered 2023-05-05: 500 [IU] via INTRAVENOUS
  Filled 2023-05-05: qty 5

## 2023-05-05 MED ORDER — ZOLEDRONIC ACID 4 MG/100ML IV SOLN
4.0000 mg | Freq: Once | INTRAVENOUS | Status: AC
  Administered 2023-05-05: 4 mg via INTRAVENOUS
  Filled 2023-05-05: qty 100

## 2023-05-05 MED ORDER — SODIUM CHLORIDE 0.9 % IV SOLN
INTRAVENOUS | Status: DC
  Filled 2023-05-05: qty 250

## 2023-05-05 NOTE — Progress Notes (Signed)
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  INTERVAL HISTORY: Patient returns to clinic today for routine evaluation and continuation of Zometa.  She continues to feel well and remains asymptomatic.  She continues to tolerate letrozole without significant side effects.  She denies any pain.  She has no neurologic complaints. She denies any recent fevers or illnesses.  She has a good appetite and denies weight loss.  She has no chest pain, shortness of breath, cough, or hemoptysis.  She denies any nausea, vomiting, constipation, or diarrhea.  She has no urinary complaints.  Patient is no specific complaints today.  REVIEW OF SYSTEMS:   Review of Systems  Constitutional: Negative.  Negative for fever, malaise/fatigue and weight loss.  Respiratory: Negative.  Negative for cough, hemoptysis and shortness of breath.   Cardiovascular: Negative.  Negative for chest pain and leg swelling.  Gastrointestinal: Negative.  Negative for abdominal pain.  Genitourinary: Negative.  Negative for dysuria.  Musculoskeletal: Negative.  Negative for back pain and joint pain.  Skin: Negative.  Negative for rash.  Neurological: Negative.  Negative for dizziness, focal weakness, weakness and headaches.  Psychiatric/Behavioral: Negative.  The patient is not nervous/anxious.     As per HPI. Otherwise, a complete review of systems is negative.  PAST MEDICAL HISTORY: Past Medical History:  Diagnosis Date   Anxiety    Arthritis    Diet-controlled diabetes mellitus (HCC)    borderline   Fatigue     HLD (hyperlipidemia)    Invasive carcinoma of breast (HCC) 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; ER/PR (+), HER2/neu (-)   Metastasis to bone (HCC)    Breast primary   Murmur, cardiac    Peripheral edema    T wave inversion on electrocardiogram 05/17/2020   leads I, II, III, aVF, V3-V6    PAST SURGICAL HISTORY: Past Surgical History:  Procedure Laterality Date   BREAST BIOPSY Right 05/02/2020   [place] Bx, Q clip, path pending   BREAST BIOPSY Right 05/02/2020   [place] Axilla Bx, path pending   CHOLECYSTECTOMY     COLONOSCOPY     LAPAROSCOPIC ABDOMINAL EXPLORATION     MASTECTOMY MODIFIED RADICAL Right 05/23/2020   Procedure: MASTECTOMY MODIFIED RADICAL, WITH EXCISION OF AXILLARY CONTENTS;  Surgeon: Earline Mayotte, MD;  Location: [facility] ORS;  Service: General;  Laterality: Right;   PORTACATH PLACEMENT Left 05/23/2020   Procedure: INSERTION PORT-A-CATH;  Surgeon: Earline Mayotte, MD;  Location: [facility] ORS;  Service: General;  Laterality: Left;    FAMILY HISTORY: Family History  Problem Relation Age of Onset   Breast cancer Sister     ADVANCED DIRECTIVES (Y/N):  N  HEALTH MAINTENANCE: Social History   Tobacco Use   Smoking status: Never   Smokeless tobacco: Never  Vaping Use   Vaping status: Never Used  Substance Use Topics   Alcohol use: Never   Drug use: Never     Colonoscopy:  PAP:  Bone density:  Lipid panel:  Allergies  Allergen Reactions   Nickel     Current Outpatient Medications  Medication Sig Dispense Refill   Calcium Carbonate-Vitamin D 600-400 MG-UNIT tablet Take by  mouth.     letrozole (FEMARA) 2.5 MG tablet TAKE 1 TABLET(2.5 MG) BY MOUTH DAILY 90 tablet 3   lidocaine-prilocaine (EMLA) cream Apply 1 Application topically See admin instructions. APPLY A SMALL AMOUNT TO PORT SITE AT LEAST 1 HOUR PRIOR TO IT BEING ACCESSED THEN COVER WITH PLASTIC WRAP 30 g 3   lisinopril (ZESTRIL) 5 MG tablet Take 1 tablet by mouth at bedtime.     lovastatin (MEVACOR) 40 MG tablet  Take 1 tablet by mouth at bedtime.     metFORMIN (GLUCOPHAGE) 500 MG tablet Take 500 mg by mouth 2 (two) times daily with a meal.     metoprolol succinate (TOPROL-XL) 25 MG 24 hr tablet Take 25 mg by mouth daily.     No current facility-administered medications for this visit.   Facility-Administered Medications Ordered in Other Visits  Medication Dose Route Frequency Provider Last Rate Last Admin   0.9 %  sodium chloride infusion   Intravenous Continuous Jeralyn Ruths, MD 20 mL/hr at 05/05/23 1544 New Bag at 05/05/23 1544   Zoledronic Acid (ZOMETA) IVPB 4 mg  4 mg Intravenous Once Jeralyn Ruths, MD 400 mL/hr at 05/05/23 1545 4 mg at 05/05/23 1545    OBJECTIVE: Vitals:   05/05/23 1432  BP: 125/65  Pulse: 86  Resp: 16  Temp: 98.2 F (36.8 C)  SpO2: 100%     Body mass index is 30.73 kg/m.    ECOG FS:0 - Asymptomatic  General: Well-developed, well-nourished, no acute distress. Eyes: Pink conjunctiva, anicteric sclera. HEENT: Normocephalic, moist mucous membranes. Lungs: No audible wheezing or coughing. Heart: Regular rate and rhythm. Abdomen: Soft, nontender, no obvious distention. Musculoskeletal: No edema, cyanosis, or clubbing.  Lymphedema sleeve noted on right arm. Neuro: Alert, answering all questions appropriately. Cranial nerves grossly intact. Skin: No rashes or petechiae noted. Psych: Normal affect.   LAB RESULTS:  Lab Results  Component Value Date   NA 137 05/05/2023   K 3.7 05/05/2023   CL 105 05/05/2023   CO2 24 05/05/2023   GLUCOSE 161 (H) 05/05/2023   BUN 12 05/05/2023   CREATININE 0.98 05/05/2023   CALCIUM 9.1 05/05/2023   PROT 7.3 05/05/2023   ALBUMIN 3.8 05/05/2023   AST 24 05/05/2023   ALT 19 05/05/2023   ALKPHOS 56 05/05/2023   BILITOT 0.5 05/05/2023   GFRNONAA >60 05/05/2023   GFRAA >60 06/18/2020    Lab Results  Component Value Date   WBC 6.7 05/05/2023   NEUTROABS 3.7 05/05/2023   HGB 12.3 05/05/2023   HCT 38.2 05/05/2023    MCV 85.5 05/05/2023   PLT 293 05/05/2023     STUDIES: No results found.   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  Patient underwent palliative mastectomy with bilateral lymph node sampling on May 23, 2020.  Patient's patient's CA 27-29 continues to fluctuate between 41.1 and 68.0.  Her most recent result was 42.5.  Today's laboratory work is pending at time of dictation.  We discussed repeating PET scan, but ultimately agreed to only do repeat imaging if her tumor marker started trending up or she became symptomatic.  We  also previously discussed the option of adding in chemotherapy with oral Ibrance or Kisqali, but patient is not interested at this time.  Patient initiated monthly Zometa on June 18, 2020.  Continue letrozole indefinitely.  Proceed with Zometa today.  Return to clinic in 3 months with repeat laboratory, further evaluation, and continuation of treatment.   Anxiety: Significantly improved.  Continue Xanax as needed. Genetic testing: Patient previously declined. Right arm lymphedema: Improved.  No evidence of DVT.  Continue sleeve and wrap as per lymphedema clinic instructions.  I spent a total of 30 minutes reviewing chart data, face-to-face evaluation with the patient, counseling and coordination of care as detailed above.    Patient expressed understanding and was in agreement with this plan. She also understands that She can call clinic at any time with any questions, concerns, or complaints.    Cancer Staging  Breast cancer, right  [facility]) Staging form: Breast, AJCC 8th Edition - Clinical: Stage IV (cT4b, cN2, cM1, ER+, PR+, HER2-) - Signed by Jeralyn Ruths, MD on 05/17/2020 Stage prefix: Initial diagnosis   Jeralyn Ruths, MD   05/05/2023 3:53 PM

## 2023-06-30 ENCOUNTER — Telehealth: Payer: Self-pay | Admitting: *Deleted

## 2023-06-30 NOTE — Telephone Encounter (Signed)
Patient son called asking for anew prescription for a new compression sleeve and Bras , prothesis to be sent to [facility] and notify him once done.

## 2023-07-01 ENCOUNTER — Encounter: Payer: Self-pay | Admitting: Oncology

## 2023-08-11 ENCOUNTER — Ambulatory Visit: Payer: Medicare PPO | Admitting: Oncology

## 2023-08-11 ENCOUNTER — Ambulatory Visit: Payer: Medicare PPO

## 2023-08-11 ENCOUNTER — Other Ambulatory Visit: Payer: Medicare PPO

## 2023-08-12 ENCOUNTER — Other Ambulatory Visit: Payer: Self-pay | Admitting: *Deleted

## 2023-08-12 DIAGNOSIS — C50911 Malignant neoplasm of unspecified site of right female breast: Secondary | ICD-10-CM

## 2023-08-13 ENCOUNTER — Inpatient Hospital Stay: Payer: Medicare PPO

## 2023-08-13 ENCOUNTER — Inpatient Hospital Stay: Payer: Medicare PPO | Attending: Oncology

## 2023-08-13 ENCOUNTER — Inpatient Hospital Stay (HOSPITAL_BASED_OUTPATIENT_CLINIC_OR_DEPARTMENT_OTHER): Payer: Medicare PPO | Admitting: Oncology

## 2023-08-13 ENCOUNTER — Encounter: Payer: Self-pay | Admitting: Oncology

## 2023-08-13 VITALS — BP 107/67 | HR 74 | Temp 96.9°F | Resp 16 | Ht 62.0 in | Wt 167.0 lb

## 2023-08-13 DIAGNOSIS — Z170 Estrogen receptor positive status [ER+]: Secondary | ICD-10-CM

## 2023-08-13 DIAGNOSIS — C7951 Secondary malignant neoplasm of bone: Secondary | ICD-10-CM | POA: Diagnosis present

## 2023-08-13 DIAGNOSIS — C773 Secondary and unspecified malignant neoplasm of axilla and upper limb lymph nodes: Secondary | ICD-10-CM | POA: Insufficient documentation

## 2023-08-13 DIAGNOSIS — Z95828 Presence of other vascular implants and grafts: Secondary | ICD-10-CM

## 2023-08-13 DIAGNOSIS — C50911 Malignant neoplasm of unspecified site of right female breast: Secondary | ICD-10-CM | POA: Diagnosis not present

## 2023-08-13 LAB — CBC WITH DIFFERENTIAL/PLATELET
Abs Immature Granulocytes: 0.01 10*3/uL (ref 0.00–0.07)
Basophils Absolute: 0 10*3/uL (ref 0.0–0.1)
Basophils Relative: 0 %
Eosinophils Absolute: 0 10*3/uL (ref 0.0–0.5)
Eosinophils Relative: 0 %
HCT: 37.9 % (ref 36.0–46.0)
Hemoglobin: 12.4 g/dL (ref 12.0–15.0)
Immature Granulocytes: 0 %
Lymphocytes Relative: 39 %
Lymphs Abs: 2.3 10*3/uL (ref 0.7–4.0)
MCH: 28.1 pg (ref 26.0–34.0)
MCHC: 32.7 g/dL (ref 30.0–36.0)
MCV: 85.7 fL (ref 80.0–100.0)
Monocytes Absolute: 0.4 10*3/uL (ref 0.1–1.0)
Monocytes Relative: 7 %
Neutro Abs: 3.1 10*3/uL (ref 1.7–7.7)
Neutrophils Relative %: 54 %
Platelets: 322 10*3/uL (ref 150–400)
RBC: 4.42 MIL/uL (ref 3.87–5.11)
RDW: 13.6 % (ref 11.5–15.5)
WBC: 5.8 10*3/uL (ref 4.0–10.5)
nRBC: 0 % (ref 0.0–0.2)

## 2023-08-13 LAB — CMP (CANCER CENTER ONLY)
ALT: 18 U/L (ref 0–44)
AST: 21 U/L (ref 15–41)
Albumin: 4.2 g/dL (ref 3.5–5.0)
Alkaline Phosphatase: 52 U/L (ref 38–126)
Anion gap: 8 (ref 5–15)
BUN: 14 mg/dL (ref 8–23)
CO2: 26 mmol/L (ref 22–32)
Calcium: 9 mg/dL (ref 8.9–10.3)
Chloride: 102 mmol/L (ref 98–111)
Creatinine: 0.89 mg/dL (ref 0.44–1.00)
GFR, Estimated: >60 mL/min (ref >60)
Glucose, Bld: 106 mg/dL — ABNORMAL HIGH (ref 70–99)
Potassium: 3.8 mmol/L (ref 3.5–5.1)
Sodium: 136 mmol/L (ref 135–145)
Total Bilirubin: 0.8 mg/dL (ref <1.2)
Total Protein: 7.8 g/dL (ref 6.5–8.1)

## 2023-08-13 MED ORDER — ZOLEDRONIC ACID 4 MG/100ML IV SOLN
4.0000 mg | Freq: Once | INTRAVENOUS | Status: AC
  Administered 2023-08-13: 4 mg via INTRAVENOUS
  Filled 2023-08-13: qty 100

## 2023-08-13 MED ORDER — SODIUM CHLORIDE 0.9 % IV SOLN
INTRAVENOUS | Status: DC
  Filled 2023-08-13: qty 250

## 2023-08-13 MED ORDER — HEPARIN SOD (PORK) LOCK FLUSH 100 UNIT/ML IV SOLN
500.0000 [IU] | Freq: Once | INTRAVENOUS | Status: AC
Start: 2023-08-13 — End: 2023-08-13
  Administered 2023-08-13: 500 [IU] via INTRAVENOUS
  Filled 2023-08-13: qty 5

## 2023-08-13 MED ORDER — SODIUM CHLORIDE 0.9% FLUSH
10.0000 mL | Freq: Once | INTRAVENOUS | Status: AC | PRN
  Administered 2023-08-13: 10 mL
  Filled 2023-08-13: qty 10

## 2023-08-13 NOTE — Patient Instructions (Signed)

## 2023-08-13 NOTE — Progress Notes (Signed)
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  INTERVAL HISTORY: Patient returns to clinic today for routine evaluation and continuation of Zometa.  She continues to take letrozole and is tolerating treatments without significant side effects.  She currently feels well and is asymptomatic.  She denies any pain.  She has no neurologic complaints. She denies any recent fevers or illnesses.  She has a good appetite and denies weight loss.  She has no chest pain, shortness of breath, cough, or hemoptysis.  She denies any nausea, vomiting, constipation, or diarrhea.  She has no urinary complaints.  Patient offers no specific complaints today.  REVIEW OF SYSTEMS:   Review of Systems  Constitutional: Negative.  Negative for fever, malaise/fatigue and weight loss.  Respiratory: Negative.  Negative for cough, hemoptysis and shortness of breath.   Cardiovascular: Negative.  Negative for chest pain and leg swelling.  Gastrointestinal: Negative.  Negative for abdominal pain.  Genitourinary: Negative.  Negative for dysuria.  Musculoskeletal: Negative.  Negative for back pain and joint pain.  Skin: Negative.  Negative for rash.  Neurological: Negative.  Negative for dizziness, focal weakness, weakness and headaches.  Psychiatric/Behavioral: Negative.  The patient is not nervous/anxious.     As per HPI. Otherwise, a complete review of systems is negative.  PAST MEDICAL HISTORY: Past Medical History:  Diagnosis Date   Anxiety    Arthritis    Diet-controlled diabetes mellitus (HCC)     borderline   Fatigue    HLD (hyperlipidemia)    Invasive carcinoma of breast (HCC) 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; ER/PR (+), HER2/neu (-)   Metastasis to bone (HCC)    Breast primary   Murmur, cardiac    Peripheral edema    T wave inversion on electrocardiogram 05/17/2020   leads I, II, III, aVF, V3-V6    PAST SURGICAL HISTORY: Past Surgical History:  Procedure Laterality Date   BREAST BIOPSY Right 05/02/2020   [place] Bx, Q clip, path pending   BREAST BIOPSY Right 05/02/2020   [place] Axilla Bx, path pending   CHOLECYSTECTOMY     COLONOSCOPY     LAPAROSCOPIC ABDOMINAL EXPLORATION     MASTECTOMY MODIFIED RADICAL Right 05/23/2020   Procedure: MASTECTOMY MODIFIED RADICAL, WITH EXCISION OF AXILLARY CONTENTS;  Surgeon: Earline Mayotte, MD;  Location: [facility] ORS;  Service: General;  Laterality: Right;   PORTACATH PLACEMENT Left 05/23/2020   Procedure: INSERTION PORT-A-CATH;  Surgeon: Earline Mayotte, MD;  Location: [facility] ORS;  Service: General;  Laterality: Left;    FAMILY HISTORY: Family History  Problem Relation Age of Onset   Breast cancer Sister     ADVANCED DIRECTIVES (Y/N):  N  HEALTH MAINTENANCE: Social History   Tobacco Use   Smoking status: Never   Smokeless tobacco: Never  Vaping Use   Vaping status: Never Used  Substance Use Topics   Alcohol use: Never   Drug use: Never     Colonoscopy:  PAP:  Bone density:  Lipid panel:  Allergies  Allergen Reactions   Nickel     Current Outpatient Medications  Medication Sig Dispense Refill   Calcium Carbonate-Vitamin D 600-400 MG-UNIT  tablet Take by mouth.     letrozole (FEMARA) 2.5 MG tablet TAKE 1 TABLET(2.5 MG) BY MOUTH DAILY 90 tablet 3   lidocaine-prilocaine (EMLA) cream Apply 1 Application topically See admin instructions. APPLY A SMALL AMOUNT TO PORT SITE AT LEAST 1 HOUR PRIOR TO IT BEING ACCESSED THEN COVER WITH PLASTIC WRAP 30 g 3   lisinopril (ZESTRIL) 5 MG tablet Take 1 tablet by mouth at bedtime.     lovastatin  (MEVACOR) 40 MG tablet Take 1 tablet by mouth at bedtime.     metFORMIN (GLUCOPHAGE) 500 MG tablet Take 500 mg by mouth 2 (two) times daily with a meal.     metoprolol succinate (TOPROL-XL) 25 MG 24 hr tablet Take 25 mg by mouth daily.     No current facility-administered medications for this visit.    OBJECTIVE: Vitals:   08/13/23 1315  BP: 107/67  Pulse: 74  Resp: 16  Temp: (!) 96.9 F (36.1 C)  SpO2: 99%     Body mass index is 30.54 kg/m.    ECOG FS:0 - Asymptomatic  General: Well-developed, well-nourished, no acute distress. Eyes: Pink conjunctiva, anicteric sclera. HEENT: Normocephalic, moist mucous membranes. Lungs: No audible wheezing or coughing. Heart: Regular rate and rhythm. Abdomen: Soft, nontender, no obvious distention. Musculoskeletal: No edema, cyanosis, or clubbing. Neuro: Alert, answering all questions appropriately. Cranial nerves grossly intact. Skin: No rashes or petechiae noted. Psych: Normal affect.  LAB RESULTS:  Lab Results  Component Value Date   NA 136 08/13/2023   K 3.8 08/13/2023   CL 102 08/13/2023   CO2 26 08/13/2023   GLUCOSE 106 (H) 08/13/2023   BUN 14 08/13/2023   CREATININE 0.89 08/13/2023   CALCIUM 9.0 08/13/2023   PROT 7.8 08/13/2023   ALBUMIN 4.2 08/13/2023   AST 21 08/13/2023   ALT 18 08/13/2023   ALKPHOS 52 08/13/2023   BILITOT 0.8 08/13/2023   GFRNONAA >60 08/13/2023   GFRAA >60 06/18/2020    Lab Results  Component Value Date   WBC 5.8 08/13/2023   NEUTROABS 3.1 08/13/2023   HGB 12.4 08/13/2023   HCT 37.9 08/13/2023   MCV 85.7 08/13/2023   PLT 322 08/13/2023     STUDIES: No results found.   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  Patient underwent palliative mastectomy with bilateral lymph node sampling on May 23, 2020.  Patient's patient's CA 27-29 continues to fluctuate between 41.1 and 68.0.  Her most recent result was 48.1.  Today's result is pending at time of dictation.  We previously discussed repeating imaging, but ultimately agreed to only do repeat imaging if her tumor marker started trending up or she became symptomatic.  We also previously discussed the option of adding in chemotherapy with oral Ibrance or Kisqali, but patient is not interested at this time.  Patient initiated monthly Zometa on June 18, 2020.  Continue letrozole indefinitely.  Proceed with Zometa today.  Return to clinic in 3 months with repeat laboratory work, further evaluation, and continuation of treatment.   Anxiety: Significantly improved.  Continue Xanax as needed. Genetic testing: Patient previously declined. Right arm lymphedema: Chronic  and unchanged.  Continue sleeve and wrap as per lymphedema clinic instructions.  I spent a total of 30 minutes reviewing chart data, face-to-face evaluation with the patient, counseling and coordination of care as detailed above.     Patient expressed understanding and was in agreement with this plan. She also understands that She can call clinic at any time with any questions, concerns, or complaints.    Cancer Staging  Breast cancer, right [facility]) Staging form: Breast, AJCC 8th Edition - Clinical: Stage IV (cT4b, cN2, cM1, ER+, PR+, HER2-) - Signed by Jeralyn Ruths, MD on 05/17/2020 Stage prefix: Initial diagnosis   Jeralyn Ruths, MD   08/13/2023 1:39 PM

## 2023-08-14 LAB — CANCER ANTIGEN 27.29: CA 27.29: 48.7 U/mL — ABNORMAL HIGH (ref 0.0–38.6)

## 2023-11-18 ENCOUNTER — Other Ambulatory Visit: Payer: Self-pay | Admitting: *Deleted

## 2023-11-18 DIAGNOSIS — C50911 Malignant neoplasm of unspecified site of right female breast: Secondary | ICD-10-CM

## 2023-11-19 ENCOUNTER — Encounter: Payer: Self-pay | Admitting: Oncology

## 2023-11-19 ENCOUNTER — Inpatient Hospital Stay (HOSPITAL_BASED_OUTPATIENT_CLINIC_OR_DEPARTMENT_OTHER): Payer: Medicare PPO | Admitting: Oncology

## 2023-11-19 ENCOUNTER — Inpatient Hospital Stay: Payer: Medicare PPO

## 2023-11-19 ENCOUNTER — Inpatient Hospital Stay: Payer: Medicare PPO | Attending: Oncology

## 2023-11-19 VITALS — BP 136/60 | HR 86 | Temp 97.9°F | Wt 179.0 lb

## 2023-11-19 VITALS — BP 115/59 | HR 69

## 2023-11-19 DIAGNOSIS — Z79811 Long term (current) use of aromatase inhibitors: Secondary | ICD-10-CM | POA: Diagnosis not present

## 2023-11-19 DIAGNOSIS — C50911 Malignant neoplasm of unspecified site of right female breast: Secondary | ICD-10-CM

## 2023-11-19 DIAGNOSIS — Z170 Estrogen receptor positive status [ER+]: Secondary | ICD-10-CM | POA: Insufficient documentation

## 2023-11-19 DIAGNOSIS — C7951 Secondary malignant neoplasm of bone: Secondary | ICD-10-CM | POA: Diagnosis present

## 2023-11-19 LAB — CBC WITH DIFFERENTIAL/PLATELET
Abs Immature Granulocytes: 0.03 10*3/uL (ref 0.00–0.07)
Basophils Absolute: 0 10*3/uL (ref 0.0–0.1)
Basophils Relative: 1 %
Eosinophils Absolute: 0 10*3/uL (ref 0.0–0.5)
Eosinophils Relative: 0 %
HCT: 39.7 % (ref 36.0–46.0)
Hemoglobin: 12.9 g/dL (ref 12.0–15.0)
Immature Granulocytes: 0 %
Lymphocytes Relative: 31 %
Lymphs Abs: 2.7 10*3/uL (ref 0.7–4.0)
MCH: 28 pg (ref 26.0–34.0)
MCHC: 32.5 g/dL (ref 30.0–36.0)
MCV: 86.1 fL (ref 80.0–100.0)
Monocytes Absolute: 0.5 10*3/uL (ref 0.1–1.0)
Monocytes Relative: 6 %
Neutro Abs: 5.5 10*3/uL (ref 1.7–7.7)
Neutrophils Relative %: 62 %
Platelets: 337 10*3/uL (ref 150–400)
RBC: 4.61 MIL/uL (ref 3.87–5.11)
RDW: 13.7 % (ref 11.5–15.5)
WBC: 8.7 10*3/uL (ref 4.0–10.5)
nRBC: 0 % (ref 0.0–0.2)

## 2023-11-19 LAB — CMP (CANCER CENTER ONLY)
ALT: 19 U/L (ref 0–44)
AST: 22 U/L (ref 15–41)
Albumin: 4 g/dL (ref 3.5–5.0)
Alkaline Phosphatase: 51 U/L (ref 38–126)
Anion gap: 11 (ref 5–15)
BUN: 15 mg/dL (ref 8–23)
CO2: 24 mmol/L (ref 22–32)
Calcium: 9.2 mg/dL (ref 8.9–10.3)
Chloride: 102 mmol/L (ref 98–111)
Creatinine: 0.84 mg/dL (ref 0.44–1.00)
GFR, Estimated: >60 mL/min (ref >60)
Glucose, Bld: 125 mg/dL — ABNORMAL HIGH (ref 70–99)
Potassium: 3.7 mmol/L (ref 3.5–5.1)
Sodium: 137 mmol/L (ref 135–145)
Total Bilirubin: 0.9 mg/dL (ref 0.0–1.2)
Total Protein: 7.3 g/dL (ref 6.5–8.1)

## 2023-11-19 MED ORDER — HEPARIN SOD (PORK) LOCK FLUSH 100 UNIT/ML IV SOLN
500.0000 [IU] | Freq: Once | INTRAVENOUS | Status: AC
  Administered 2023-11-19: 500 [IU] via INTRAVENOUS
  Filled 2023-11-19: qty 5

## 2023-11-19 MED ORDER — ZOLEDRONIC ACID 4 MG/100ML IV SOLN
4.0000 mg | Freq: Once | INTRAVENOUS | Status: AC
  Administered 2023-11-19: 4 mg via INTRAVENOUS
  Filled 2023-11-19: qty 100

## 2023-11-19 MED ORDER — SODIUM CHLORIDE 0.9 % IV SOLN
INTRAVENOUS | Status: DC | PRN
  Filled 2023-11-19: qty 250

## 2023-11-19 NOTE — Progress Notes (Unsigned)
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  INTERVAL HISTORY: Patient returns to clinic today for routine evaluation and continuation of Zometa.  She continues to take letrozole and is tolerating treatments without significant side effects.  She currently feels well and is asymptomatic.  She denies any pain.  She has no neurologic complaints. She denies any recent fevers or illnesses.  She has a good appetite and denies weight loss.  She has no chest pain, shortness of breath, cough, or hemoptysis.  She denies any nausea, vomiting, constipation, or diarrhea.  She has no urinary complaints.  Patient offers no specific complaints today.  REVIEW OF SYSTEMS:   Review of Systems  Constitutional: Negative.  Negative for fever, malaise/fatigue and weight loss.  Respiratory: Negative.  Negative for cough, hemoptysis and shortness of breath.   Cardiovascular: Negative.  Negative for chest pain and leg swelling.  Gastrointestinal: Negative.  Negative for abdominal pain.  Genitourinary: Negative.  Negative for dysuria.  Musculoskeletal: Negative.  Negative for back pain and joint pain.  Skin: Negative.  Negative for rash.  Neurological: Negative.  Negative for dizziness, focal weakness, weakness and headaches.  Psychiatric/Behavioral: Negative.  The patient is not nervous/anxious.     As per HPI. Otherwise, a complete review of systems is negative.  PAST MEDICAL HISTORY: Past Medical History:  Diagnosis Date  . Anxiety   . Arthritis   . Diet-controlled diabetes mellitus (HCC)     borderline  . Fatigue   . HLD (hyperlipidemia)   . Invasive carcinoma of breast (HCC) 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; ER/PR (+), HER2/neu (-)  . Metastasis to bone [facility])    Breast primary  . Murmur, cardiac   . Peripheral edema   . T wave inversion on electrocardiogram 05/17/2020   leads I, II, III, aVF, V3-V6    PAST SURGICAL HISTORY: Past Surgical History:  Procedure Laterality Date  . BREAST BIOPSY Right 05/02/2020   [place] Bx, Q clip, path pending  . BREAST BIOPSY Right 05/02/2020   [place] Axilla Bx, path pending  . CHOLECYSTECTOMY    . COLONOSCOPY    . LAPAROSCOPIC ABDOMINAL EXPLORATION    . MASTECTOMY MODIFIED RADICAL Right 05/23/2020   Procedure: MASTECTOMY MODIFIED RADICAL, WITH EXCISION OF AXILLARY CONTENTS;  Surgeon: Earline Mayotte, MD;  Location: [facility] ORS;  Service: General;  Laterality: Right;  . PORTACATH PLACEMENT Left 05/23/2020   Procedure: INSERTION PORT-A-CATH;  Surgeon: Earline Mayotte, MD;  Location: [facility] ORS;  Service: General;  Laterality: Left;    FAMILY HISTORY: Family History  Problem Relation Age of Onset  . Breast cancer Sister     ADVANCED DIRECTIVES (Y/N):  N  HEALTH MAINTENANCE: Social History   Tobacco Use  . Smoking status: Never  . Smokeless tobacco: Never  Vaping Use  . Vaping status: Never Used  Substance Use Topics  . Alcohol use: Never  . Drug use: Never     Colonoscopy:  PAP:  Bone density:  Lipid panel:  Allergies  Allergen Reactions  . Nickel     Current Outpatient Medications  Medication Sig Dispense Refill  . Calcium Carbonate-Vitamin D 600-400 MG-UNIT  tablet Take by mouth.    . letrozole (FEMARA) 2.5 MG tablet TAKE 1 TABLET(2.5 MG) BY MOUTH DAILY 90 tablet 3  . lidocaine-prilocaine (EMLA) cream Apply 1 Application topically See admin instructions. APPLY A SMALL AMOUNT TO PORT SITE AT LEAST 1 HOUR PRIOR TO IT BEING ACCESSED THEN COVER WITH PLASTIC WRAP 30 g 3  . lovastatin (MEVACOR) 40 MG tablet Take 1 tablet by mouth  at bedtime.    . metFORMIN (GLUCOPHAGE) 500 MG tablet Take 500 mg by mouth 2 (two) times daily with a meal.    . metoprolol succinate (TOPROL-XL) 25 MG 24 hr tablet Take 25 mg by mouth daily.    Marland Kitchen lisinopril (ZESTRIL) 5 MG tablet Take 1 tablet by mouth at bedtime.     No current facility-administered medications for this visit.    OBJECTIVE: Vitals:   11/19/23 1409  BP: 136/60  Pulse: 86  Temp: 97.9 F (36.6 C)  SpO2: 100%     Body mass index is 32.74 kg/m.    ECOG FS:0 - Asymptomatic  General: Well-developed, well-nourished, no acute distress. Eyes: Pink conjunctiva, anicteric sclera. HEENT: Normocephalic, moist mucous membranes. Lungs: No audible wheezing or coughing. Heart: Regular rate and rhythm. Abdomen: Soft, nontender, no obvious distention. Musculoskeletal: No edema, cyanosis, or clubbing. Neuro: Alert, answering all questions appropriately. Cranial nerves grossly intact. Skin: No rashes or petechiae noted. Psych: Normal affect.  LAB RESULTS:  Lab Results  Component Value Date   NA 137 11/19/2023   K 3.7 11/19/2023   CL 102 11/19/2023   CO2 24 11/19/2023   GLUCOSE 125 (H) 11/19/2023   BUN 15 11/19/2023   CREATININE 0.84 11/19/2023   CALCIUM 9.2 11/19/2023   PROT 7.3 11/19/2023   ALBUMIN 4.0 11/19/2023   AST 22 11/19/2023   ALT 19 11/19/2023   ALKPHOS 51 11/19/2023   BILITOT 0.9 11/19/2023   GFRNONAA >60 11/19/2023   GFRAA >60 06/18/2020    Lab Results  Component Value Date   WBC 8.7 11/19/2023   NEUTROABS 5.5 11/19/2023   HGB 12.9 11/19/2023   HCT 39.7 11/19/2023   MCV 86.1 11/19/2023   PLT 337 11/19/2023     STUDIES: No results found.   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  Patient underwent palliative mastectomy with bilateral lymph node sampling on May 23, 2020.  Patient's patient's CA 27-29 continues to fluctuate between 41.1 and 68.0.  Her most recent result was 48.1.  Today's result is pending at time of dictation.  We previously discussed repeating imaging, but ultimately agreed to only do repeat imaging if her tumor marker started trending up or she became symptomatic.  We also previously discussed the option of adding in chemotherapy with oral Ibrance or Kisqali, but patient is not interested at this time.  Patient initiated monthly Zometa on June 18, 2020.  Continue letrozole indefinitely.  Proceed with Zometa today.  Return to clinic in 3 months with repeat laboratory work, further evaluation, and continuation of treatment.   Anxiety: Significantly improved.  Continue Xanax as needed. Genetic testing: Patient previously declined. Right arm lymphedema: Chronic and unchanged.  Continue  sleeve and wrap as per lymphedema clinic instructions.  I spent a total of 30 minutes reviewing chart data, face-to-face evaluation with the patient, counseling and coordination of care as detailed above.     Patient expressed understanding and was in agreement with this plan. She also understands that She can call clinic at any time with any questions, concerns, or complaints.    Cancer Staging  Breast cancer, right [facility]) Staging form: Breast, AJCC 8th Edition - Clinical: Stage IV (cT4b, cN2, cM1, ER+, PR+, HER2-) - Signed by Jeralyn Ruths, MD on 05/17/2020 Stage prefix: Initial diagnosis   Jeralyn Ruths, MD   11/19/2023 2:54 PM

## 2023-11-20 ENCOUNTER — Encounter: Payer: Self-pay | Admitting: Oncology

## 2023-11-20 LAB — CANCER ANTIGEN 27.29: CA 27.29: 54.4 U/mL — ABNORMAL HIGH (ref 0.0–38.6)

## 2024-01-13 ENCOUNTER — Other Ambulatory Visit: Payer: Self-pay | Admitting: Oncology

## 2024-01-21 ENCOUNTER — Other Ambulatory Visit: Payer: Self-pay | Admitting: Nurse Practitioner

## 2024-01-21 DIAGNOSIS — Z95828 Presence of other vascular implants and grafts: Secondary | ICD-10-CM

## 2024-01-26 ENCOUNTER — Encounter: Payer: Self-pay | Admitting: Oncology

## 2024-02-17 ENCOUNTER — Other Ambulatory Visit: Payer: Self-pay | Admitting: *Deleted

## 2024-02-17 DIAGNOSIS — C50911 Malignant neoplasm of unspecified site of right female breast: Secondary | ICD-10-CM

## 2024-02-18 ENCOUNTER — Encounter: Payer: Self-pay | Admitting: Oncology

## 2024-02-18 ENCOUNTER — Inpatient Hospital Stay: Payer: Medicare PPO | Admitting: Oncology

## 2024-02-18 ENCOUNTER — Inpatient Hospital Stay: Payer: Medicare PPO | Attending: Oncology

## 2024-02-18 ENCOUNTER — Inpatient Hospital Stay: Payer: Medicare PPO

## 2024-02-18 VITALS — BP 121/68 | HR 72 | Temp 97.6°F | Resp 20 | Wt 166.1 lb

## 2024-02-18 DIAGNOSIS — C7951 Secondary malignant neoplasm of bone: Secondary | ICD-10-CM | POA: Insufficient documentation

## 2024-02-18 DIAGNOSIS — Z170 Estrogen receptor positive status [ER+]: Secondary | ICD-10-CM | POA: Insufficient documentation

## 2024-02-18 DIAGNOSIS — C50911 Malignant neoplasm of unspecified site of right female breast: Secondary | ICD-10-CM

## 2024-02-18 DIAGNOSIS — Z79811 Long term (current) use of aromatase inhibitors: Secondary | ICD-10-CM | POA: Diagnosis not present

## 2024-02-18 LAB — CMP (CANCER CENTER ONLY)
ALT: 17 U/L (ref 0–44)
AST: 20 U/L (ref 15–41)
Albumin: 3.8 g/dL (ref 3.5–5.0)
Alkaline Phosphatase: 49 U/L (ref 38–126)
Anion gap: 9 (ref 5–15)
BUN: 15 mg/dL (ref 8–23)
CO2: 24 mmol/L (ref 22–32)
Calcium: 9 mg/dL (ref 8.9–10.3)
Chloride: 104 mmol/L (ref 98–111)
Creatinine: 0.79 mg/dL (ref 0.44–1.00)
GFR, Estimated: >60 mL/min (ref >60)
Glucose, Bld: 118 mg/dL — ABNORMAL HIGH (ref 70–99)
Potassium: 4 mmol/L (ref 3.5–5.1)
Sodium: 137 mmol/L (ref 135–145)
Total Bilirubin: 0.7 mg/dL (ref 0.0–1.2)
Total Protein: 7.2 g/dL (ref 6.5–8.1)

## 2024-02-18 LAB — CBC WITH DIFFERENTIAL/PLATELET
Abs Immature Granulocytes: 0.02 10*3/uL (ref 0.00–0.07)
Basophils Absolute: 0 10*3/uL (ref 0.0–0.1)
Basophils Relative: 1 %
Eosinophils Absolute: 0 10*3/uL (ref 0.0–0.5)
Eosinophils Relative: 0 %
HCT: 39.7 % (ref 36.0–46.0)
Hemoglobin: 12.9 g/dL (ref 12.0–15.0)
Immature Granulocytes: 0 %
Lymphocytes Relative: 38 %
Lymphs Abs: 2.5 10*3/uL (ref 0.7–4.0)
MCH: 27.8 pg (ref 26.0–34.0)
MCHC: 32.5 g/dL (ref 30.0–36.0)
MCV: 85.6 fL (ref 80.0–100.0)
Monocytes Absolute: 0.4 10*3/uL (ref 0.1–1.0)
Monocytes Relative: 6 %
Neutro Abs: 3.5 10*3/uL (ref 1.7–7.7)
Neutrophils Relative %: 55 %
Platelets: 326 10*3/uL (ref 150–400)
RBC: 4.64 MIL/uL (ref 3.87–5.11)
RDW: 14.3 % (ref 11.5–15.5)
WBC: 6.5 10*3/uL (ref 4.0–10.5)
nRBC: 0 % (ref 0.0–0.2)

## 2024-02-18 MED ORDER — ALTEPLASE 2 MG IJ SOLR
2.0000 mg | Freq: Once | INTRAMUSCULAR | Status: DC | PRN
  Filled 2024-02-18: qty 2

## 2024-02-18 MED ORDER — HEPARIN SOD (PORK) LOCK FLUSH 100 UNIT/ML IV SOLN
250.0000 [IU] | Freq: Once | INTRAVENOUS | Status: DC | PRN
Start: 2024-02-18 — End: 2024-02-18
  Filled 2024-02-18: qty 5

## 2024-02-18 MED ORDER — ZOLEDRONIC ACID 4 MG/100ML IV SOLN
4.0000 mg | Freq: Once | INTRAVENOUS | Status: AC
  Administered 2024-02-18: 4 mg via INTRAVENOUS
  Filled 2024-02-18: qty 100

## 2024-02-18 MED ORDER — SODIUM CHLORIDE 0.9% FLUSH
10.0000 mL | Freq: Once | INTRAVENOUS | Status: DC | PRN
  Filled 2024-02-18: qty 10

## 2024-02-18 NOTE — Progress Notes (Signed)
 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  INTERVAL HISTORY: Patient returns to clinic today for routine 31-month evaluation and continuation of Zometa .  She continues to feel well and remains asymptomatic.  She is tolerating letrozole  without significant side effects.  She denies any pain.  She has no neurologic complaints. She denies any recent fevers or illnesses.  She has a good appetite and denies weight loss.  She has no chest pain, shortness of breath, cough, or hemoptysis.  She denies any nausea, vomiting, constipation, or diarrhea.  She has no urinary complaints.  Patient offers no specific complaints today.  REVIEW OF SYSTEMS:   Review of Systems  Constitutional: Negative.  Negative for fever, malaise/fatigue and weight loss.  Respiratory: Negative.  Negative for cough, hemoptysis and shortness of breath.   Cardiovascular: Negative.  Negative for chest pain and leg swelling.  Gastrointestinal: Negative.  Negative for abdominal pain.  Genitourinary: Negative.  Negative for dysuria.  Musculoskeletal: Negative.  Negative for back pain and joint pain.  Skin: Negative.  Negative for rash.  Neurological: Negative.  Negative for dizziness, focal weakness, weakness and headaches.  Psychiatric/Behavioral: Negative.  The patient is not nervous/anxious.     As per HPI. Otherwise, a complete review of systems is negative.  PAST MEDICAL HISTORY: Past Medical History:  Diagnosis Date   Anxiety    Arthritis    Diet-controlled diabetes mellitus (HCC)    borderline    Fatigue    HLD (hyperlipidemia)    Invasive carcinoma of breast (HCC) 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; ER/PR (+), HER2/neu (-)   Metastasis to bone (HCC)    Breast primary   Murmur, cardiac    Peripheral edema    T wave inversion on electrocardiogram 05/17/2020   leads I, II, III, aVF, V3-V6    PAST SURGICAL HISTORY: Past Surgical History:  Procedure Laterality Date   BREAST BIOPSY Right 05/02/2020   US  Bx, Q clip, path pending   BREAST BIOPSY Right 05/02/2020   US  Axilla Bx, path pending   CHOLECYSTECTOMY     COLONOSCOPY     LAPAROSCOPIC ABDOMINAL EXPLORATION     MASTECTOMY MODIFIED RADICAL Right 05/23/2020   Procedure: MASTECTOMY MODIFIED RADICAL, WITH EXCISION OF AXILLARY CONTENTS;  Surgeon: Marshall Skeeter, MD;  Location: [facility] ORS;  Service: General;  Laterality: Right;   PORTACATH PLACEMENT Left 05/23/2020   Procedure: INSERTION PORT-A-CATH;  Surgeon: Marshall Skeeter, MD;  Location: [facility] ORS;  Service: General;  Laterality: Left;    FAMILY HISTORY: Family History  Problem Relation Age of Onset   Breast cancer Sister     ADVANCED DIRECTIVES (Y/N):  N  HEALTH MAINTENANCE: Social History   Tobacco Use   Smoking status: Never   Smokeless tobacco: Never  Vaping Use   Vaping status: Never Used  Substance Use Topics   Alcohol use: Never   Drug use: Never     Colonoscopy:  PAP:  Bone density:  Lipid panel:  Allergies  Allergen Reactions   Nickel     Current Outpatient Medications  Medication Sig Dispense Refill   Calcium Carbonate-Vitamin D 600-400 MG-UNIT tablet Take by  mouth.     letrozole  (FEMARA ) 2.5 MG tablet TAKE 1 TABLET(2.5 MG) BY MOUTH DAILY 90 tablet 3   lidocaine -prilocaine  (EMLA ) cream APPLY A SMALL AMOUNT TO PORT SITE AT LEAST 1 HOUR PRIOR TO IT BEING ACCESSED THEN COVER WITH PLASTIC WRAP 30 g 3   lisinopril (ZESTRIL) 5 MG tablet Take 1 tablet by mouth at bedtime.     lovastatin (MEVACOR) 40 MG tablet Take 1 tablet by mouth at bedtime.      metFORMIN (GLUCOPHAGE) 500 MG tablet Take 500 mg by mouth 2 (two) times daily with a meal.     metoprolol  succinate (TOPROL -XL) 25 MG 24 hr tablet Take 25 mg by mouth daily.     No current facility-administered medications for this visit.    OBJECTIVE: Vitals:   02/18/24 1045  BP: 121/68  Pulse: 72  Resp: 20  Temp: 97.6 F (36.4 C)  SpO2: 100%     Body mass index is 30.38 kg/m.    ECOG FS:0 - Asymptomatic  General: Well-developed, well-nourished, no acute distress. Eyes: Pink conjunctiva, anicteric sclera. HEENT: Normocephalic, moist mucous membranes. Lungs: No audible wheezing or coughing. Heart: Regular rate and rhythm. Abdomen: Soft, nontender, no obvious distention. Musculoskeletal: No edema, cyanosis, or clubbing. Neuro: Alert, answering all questions appropriately. Cranial nerves grossly intact. Skin: No rashes or petechiae noted. Psych: Normal affect.  LAB RESULTS:  Lab Results  Component Value Date   NA 137 02/18/2024   K 4.0 02/18/2024   CL 104 02/18/2024   CO2 24 02/18/2024   GLUCOSE 118 (H) 02/18/2024   BUN 15 02/18/2024   CREATININE 0.79 02/18/2024   CALCIUM 9.0 02/18/2024   PROT 7.2 02/18/2024   ALBUMIN 3.8 02/18/2024   AST 20 02/18/2024   ALT 17 02/18/2024   ALKPHOS 49 02/18/2024   BILITOT 0.7 02/18/2024   GFRNONAA >60 02/18/2024   GFRAA >60 06/18/2020    Lab Results  Component Value Date   WBC 6.5 02/18/2024   NEUTROABS 3.5 02/18/2024   HGB 12.9 02/18/2024   HCT 39.7 02/18/2024   MCV 85.6 02/18/2024   PLT 326 02/18/2024     STUDIES: No results found.  ONCOLOGY HISTORY: PET scan results from May 16, 2020 reviewed independently with intensely hypermetabolic right breast mass with metastatic lesions in bilateral axillary lymph node as well as multifocal hypermetabolic bone metastasis.  Patient underwent palliative mastectomy with bilateral lymph node sampling on May 23, 2020.  We discussed initiating treatment with Ibrance, but  patient declined and only agreed to letrozole  only and Zometa . Patient initiated monthly Zometa  on June 18, 2020.   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  Her most recent result is 54.4.  Today's result is pending.  We previously discussed repeating imaging, but ultimately agreed to only do repeat imaging if her tumor marker started trending up or she became symptomatic.  We also previously discussed the option of adding in chemotherapy with oral Ibrance or Kisqali, but patient continues to not be interested in adding additional treatment.  Continue with letrozole  only indefinitely or until progression of disease.  Proceed with Zometa  today.  Return to clinic in 3 months for further evaluation and continuation of treatment.   Anxiety: Significantly improved.  Continue  Xanax  as needed. Genetic testing: Patient previously declined. Right arm lymphedema: Chronic and unchanged.  Continue sleeve and wrap as per lymphedema clinic instructions.  I spent a total of 30 minutes reviewing chart data, face-to-face evaluation with the patient, counseling and coordination of care as detailed above.   Patient expressed understanding and was in agreement with this plan. She also understands that She can call clinic at any time with any questions, concerns, or complaints.    Cancer Staging  Breast cancer, right [facility]) Staging form: Breast, AJCC 8th Edition - Clinical: Stage IV (cT4b, cN2, cM1, ER+, PR+, HER2-) - Signed by Shellie Dials, MD on 05/17/2020 Stage prefix: Initial diagnosis   Shellie Dials, MD   02/18/2024 11:12 AM

## 2024-02-18 NOTE — Patient Instructions (Signed)

## 2024-02-19 LAB — CANCER ANTIGEN 27.29: CA 27.29: 50.9 U/mL — ABNORMAL HIGH (ref 0.0–38.6)

## 2024-03-07 ENCOUNTER — Telehealth: Payer: Self-pay | Admitting: Oncology

## 2024-03-07 NOTE — Telephone Encounter (Signed)
 Spoke with pt son about the updated appts and mailed AVS. (MD will not be here on 8/15)

## 2024-05-19 ENCOUNTER — Encounter: Payer: Self-pay | Admitting: Oncology

## 2024-05-19 ENCOUNTER — Inpatient Hospital Stay: Attending: Oncology

## 2024-05-19 ENCOUNTER — Inpatient Hospital Stay (HOSPITAL_BASED_OUTPATIENT_CLINIC_OR_DEPARTMENT_OTHER): Attending: Oncology | Admitting: Oncology

## 2024-05-19 ENCOUNTER — Inpatient Hospital Stay

## 2024-05-19 VITALS — BP 122/64 | HR 71 | Temp 97.6°F | Resp 18 | Wt 165.0 lb

## 2024-05-19 DIAGNOSIS — C50911 Malignant neoplasm of unspecified site of right female breast: Secondary | ICD-10-CM | POA: Diagnosis not present

## 2024-05-19 DIAGNOSIS — Z170 Estrogen receptor positive status [ER+]: Secondary | ICD-10-CM

## 2024-05-19 DIAGNOSIS — Z7983 Long term (current) use of bisphosphonates: Secondary | ICD-10-CM | POA: Diagnosis not present

## 2024-05-19 DIAGNOSIS — Z5181 Encounter for therapeutic drug level monitoring: Secondary | ICD-10-CM

## 2024-05-19 DIAGNOSIS — Z79811 Long term (current) use of aromatase inhibitors: Secondary | ICD-10-CM

## 2024-05-19 DIAGNOSIS — C7951 Secondary malignant neoplasm of bone: Secondary | ICD-10-CM | POA: Insufficient documentation

## 2024-05-19 LAB — CBC WITH DIFFERENTIAL (CANCER CENTER ONLY)
Abs Immature Granulocytes: 0.02 K/uL (ref 0.00–0.07)
Basophils Absolute: 0 K/uL (ref 0.0–0.1)
Basophils Relative: 1 %
Eosinophils Absolute: 0.1 K/uL (ref 0.0–0.5)
Eosinophils Relative: 2 %
HCT: 38.7 % (ref 36.0–46.0)
Hemoglobin: 12.7 g/dL (ref 12.0–15.0)
Immature Granulocytes: 0 %
Lymphocytes Relative: 44 %
Lymphs Abs: 2.7 K/uL (ref 0.7–4.0)
MCH: 28.4 pg (ref 26.0–34.0)
MCHC: 32.8 g/dL (ref 30.0–36.0)
MCV: 86.6 fL (ref 80.0–100.0)
Monocytes Absolute: 0.5 K/uL (ref 0.1–1.0)
Monocytes Relative: 7 %
Neutro Abs: 2.8 K/uL (ref 1.7–7.7)
Neutrophils Relative %: 46 %
Platelet Count: 326 K/uL (ref 150–400)
RBC: 4.47 MIL/uL (ref 3.87–5.11)
RDW: 13.8 % (ref 11.5–15.5)
WBC Count: 6.2 K/uL (ref 4.0–10.5)
nRBC: 0 % (ref 0.0–0.2)

## 2024-05-19 LAB — CMP (CANCER CENTER ONLY)
ALT: 17 U/L (ref 0–44)
AST: 21 U/L (ref 15–41)
Albumin: 3.8 g/dL (ref 3.5–5.0)
Alkaline Phosphatase: 51 U/L (ref 38–126)
Anion gap: 8 (ref 5–15)
BUN: 15 mg/dL (ref 8–23)
CO2: 25 mmol/L (ref 22–32)
Calcium: 9.4 mg/dL (ref 8.9–10.3)
Chloride: 103 mmol/L (ref 98–111)
Creatinine: 0.72 mg/dL (ref 0.44–1.00)
GFR, Estimated: >60 mL/min (ref >60)
Glucose, Bld: 107 mg/dL — ABNORMAL HIGH (ref 70–99)
Potassium: 4.1 mmol/L (ref 3.5–5.1)
Sodium: 136 mmol/L (ref 135–145)
Total Bilirubin: 0.7 mg/dL (ref 0.0–1.2)
Total Protein: 7 g/dL (ref 6.5–8.1)

## 2024-05-19 MED ORDER — SODIUM CHLORIDE 0.9 % IV SOLN
INTRAVENOUS | Status: DC
  Filled 2024-05-19: qty 250

## 2024-05-19 MED ORDER — ZOLEDRONIC ACID 4 MG/100ML IV SOLN
4.0000 mg | Freq: Once | INTRAVENOUS | Status: AC
  Administered 2024-05-19: 4 mg via INTRAVENOUS
  Filled 2024-05-19: qty 100

## 2024-05-19 MED ORDER — SODIUM CHLORIDE 0.9% FLUSH
3.0000 mL | Freq: Once | INTRAVENOUS | Status: DC | PRN
  Filled 2024-05-19: qty 3

## 2024-05-19 MED ORDER — SODIUM CHLORIDE 0.9% FLUSH
10.0000 mL | Freq: Once | INTRAVENOUS | Status: DC | PRN
  Filled 2024-05-19: qty 10

## 2024-05-19 MED ORDER — ALTEPLASE 2 MG IJ SOLR
2.0000 mg | Freq: Once | INTRAMUSCULAR | Status: DC | PRN
  Filled 2024-05-19: qty 2

## 2024-05-19 NOTE — Progress Notes (Signed)
 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  INTERVAL HISTORY: Patient returns to clinic today for routine 58-month evaluation and continuation of Zometa .  She continues to feel well and remains asymptomatic.  She continues to tolerate letrozole  without significant side effects.  She has persistent lymphedema, but denies any pain.  She has no neurologic complaints. She denies any recent fevers or illnesses.  She has a good appetite and denies weight loss.  She has no chest pain, shortness of breath, cough, or hemoptysis.  She denies any nausea, vomiting, constipation, or diarrhea.  She has no urinary complaints.  [name]  REVIEW OF SYSTEMS:   Review of Systems  Constitutional: Negative.  Negative for fever, malaise/fatigue and weight loss.  Respiratory: Negative.  Negative for cough, hemoptysis and shortness of breath.   Cardiovascular: Negative.  Negative for chest pain and leg swelling.  Gastrointestinal: Negative.  Negative for abdominal pain.  Genitourinary: Negative.  Negative for dysuria.  Musculoskeletal: Negative.  Negative for back pain and joint pain.  Skin: Negative.  Negative for rash.  Neurological: Negative.  Negative for dizziness, focal weakness, weakness and headaches.  Psychiatric/Behavioral: Negative.  The patient is not nervous/anxious.     As per HPI. Otherwise, a complete review of systems is negative.  PAST MEDICAL HISTORY: Past Medical History:  Diagnosis Date   Anxiety    Arthritis    Diet-controlled diabetes  mellitus (HCC)    borderline   Fatigue    HLD (hyperlipidemia)    Invasive carcinoma of breast (HCC) 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; ER/PR (+), HER2/neu (-)   Metastasis to bone (HCC)    Breast primary   Murmur, cardiac    Peripheral edema    T wave inversion on electrocardiogram 05/17/2020   leads I, II, III, aVF, V3-V6    PAST SURGICAL HISTORY: Past Surgical History:  Procedure Laterality Date   BREAST BIOPSY Right 05/02/2020   US  Bx, Q clip, path pending   BREAST BIOPSY Right 05/02/2020   US  Axilla Bx, path pending   CHOLECYSTECTOMY     COLONOSCOPY     LAPAROSCOPIC ABDOMINAL EXPLORATION     MASTECTOMY MODIFIED RADICAL Right 05/23/2020   Procedure: MASTECTOMY MODIFIED RADICAL, WITH EXCISION OF AXILLARY CONTENTS;  Surgeon: Dessa Reyes ORN, MD;  Location: [facility] ORS;  Service: General;  Laterality: Right;   PORTACATH PLACEMENT Left 05/23/2020   Procedure: INSERTION PORT-A-CATH;  Surgeon: Dessa Reyes ORN, MD;  Location: [facility] OR[facility];  Service: General;  Laterality: Left;    FAMILY HISTORY: Family History  Problem Relation Age of Onset   Breast cancer Sister     ADVANCED DIRECTIVES (Y/N):  N  HEALTH MAINTENANCE: Social History   Tobacco Use   Smoking status: Never   Smokeless tobacco: Never  Vaping Use   Vaping status: Never Used  Substance Use Topics   Alcohol use: Never   Drug use: Never     Colonoscopy:  PAP:  Bone density:  Lipid panel:  Allergies  Allergen Reactions   Nickel     Current Outpatient Medications  Medication Sig Dispense Refill   Calcium Carbonate-Vitamin D 600-400  MG-UNIT tablet Take by mouth.     letrozole  (FEMARA ) 2.5 MG tablet TAKE 1 TABLET(2.5 MG) BY MOUTH DAILY 90 tablet 3   lidocaine -prilocaine  (EMLA ) cream APPLY A SMALL AMOUNT TO PORT SITE AT LEAST 1 HOUR PRIOR TO IT BEING ACCESSED THEN COVER WITH PLASTIC WRAP 30 g 3   lisinopril (ZESTRIL) 5 MG tablet Take 1 tablet by mouth at bedtime.     lovastatin (MEVACOR) 40 MG tablet Take 1 tablet  by mouth at bedtime.     metFORMIN (GLUCOPHAGE) 500 MG tablet Take 500 mg by mouth 2 (two) times daily with a meal.     metoprolol  succinate (TOPROL -XL) 25 MG 24 hr tablet Take 25 mg by mouth daily.     No current facility-administered medications for this visit.    OBJECTIVE: Vitals:   05/19/24 1019  BP: 122/64  Pulse: 71  Resp: 18  Temp: 97.6 F (36.4 C)  SpO2: 97%     Body mass index is 30.18 kg/m.    ECOG FS:0 - Asymptomatic  General: Well-developed, well-nourished, no acute distress. Eyes: Pink conjunctiva, anicteric sclera. HEENT: Normocephalic, moist mucous membranes. Lungs: No audible wheezing or coughing. Heart: Regular rate and rhythm. Abdomen: Soft, nontender, no obvious distention. Musculoskeletal: Right arm in lymphedema sleeve. Neuro: Alert, answering all questions appropriately. Cranial nerves grossly intact. Skin: No rashes or petechiae noted. Psych: Normal affect.  LAB RESULTS:  Lab Results  Component Value Date   NA 136 05/19/2024   K 4.1 05/19/2024   CL 103 05/19/2024   CO2 25 05/19/2024   GLUCOSE 107 (H) 05/19/2024   BUN 15 05/19/2024   CREATININE 0.72 05/19/2024   CALCIUM 9.4 05/19/2024   PROT 7.0 05/19/2024   ALBUMIN 3.8 05/19/2024   AST 21 05/19/2024   ALT 17 05/19/2024   ALKPHOS 51 05/19/2024   BILITOT 0.7 05/19/2024   GFRNONAA >60 05/19/2024   GFRAA >60 06/18/2020    Lab Results  Component Value Date   WBC 6.2 05/19/2024   NEUTROABS 2.8 05/19/2024   HGB 12.7 05/19/2024   HCT 38.7 05/19/2024   MCV 86.6 05/19/2024   PLT 326 05/19/2024     STUDIES: No results found.  ONCOLOGY HISTORY: PET scan results from May 16, 2020 reviewed independently with intensely hypermetabolic right breast mass with metastatic lesions in bilateral axillary lymph node as well as multifocal hypermetabolic bone metastasis.  Patient underwent palliative mastectomy with bilateral lymph node sampling on May 23, 2020.  We discussed initiating treatment  with Ibrance, but patient declined and only agreed to letrozole  only and Zometa . Patient initiated monthly Zometa  on June 18, 2020.   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  Her most recent result is 51.8.  We previously discussed repeating imaging, but ultimately agreed to only do repeat imaging if her tumor marker started trending up or she became symptomatic.  We also previously discussed the option of adding in chemotherapy with oral Ibrance or Kisqali, but patient continues to not be interested in adding additional treatment.  Continue with letrozole  only indefinitely or until progression of disease.  Proceed with Zometa  today.  Return to clinic in 3 months for repeat laboratory work, further evaluation, and continuation of treatment.   Anxiety: Significantly improved.  Continue Xanax  as needed. Genetic testing: Patient previously declined. Right arm lymphedema: Chronic and unchanged.  Continue sleeve and wrap as per lymphedema clinic instructions.  I spent a total of 30 minutes reviewing chart data, face-to-face evaluation with the patient, counseling and coordination of care as detailed above.    Patient expressed understanding and was in agreement with this plan. She also understands that She can call clinic at any time with any questions, concerns, or complaints.    Cancer Staging  Breast cancer, right [facility]) Staging form: Breast, AJCC 8th Edition - Clinical: Stage IV (cT4b, cN2, cM1, ER+, PR+, HER2-) - Signed by Jacobo Evalene PARAS, MD on 05/17/2020 Stage prefix: Initial diagnosis   Evalene PARAS Jacobo, MD   05/20/2024 12:25 PM

## 2024-05-19 NOTE — Progress Notes (Signed)
 Patient states that she is doing ok, no new questions for the doctor today.

## 2024-05-20 ENCOUNTER — Ambulatory Visit

## 2024-05-20 ENCOUNTER — Ambulatory Visit: Admitting: Oncology

## 2024-05-20 ENCOUNTER — Encounter: Payer: Self-pay | Admitting: Oncology

## 2024-05-20 ENCOUNTER — Other Ambulatory Visit

## 2024-05-20 LAB — CA 27.29 (SERIAL MONITOR): CA 27.29: 51.8 U/mL — ABNORMAL HIGH (ref 0.0–38.6)

## 2024-08-19 ENCOUNTER — Inpatient Hospital Stay

## 2024-08-19 ENCOUNTER — Inpatient Hospital Stay: Attending: Oncology

## 2024-08-19 ENCOUNTER — Inpatient Hospital Stay (HOSPITAL_BASED_OUTPATIENT_CLINIC_OR_DEPARTMENT_OTHER): Admitting: Oncology

## 2024-08-19 ENCOUNTER — Encounter: Payer: Self-pay | Admitting: Oncology

## 2024-08-19 VITALS — BP 138/66 | HR 79 | Temp 97.9°F | Resp 18 | Wt 162.0 lb

## 2024-08-19 DIAGNOSIS — C50911 Malignant neoplasm of unspecified site of right female breast: Secondary | ICD-10-CM | POA: Diagnosis not present

## 2024-08-19 DIAGNOSIS — Z17411 Hormone receptor positive with human epidermal growth factor receptor 2 negative status: Secondary | ICD-10-CM | POA: Insufficient documentation

## 2024-08-19 DIAGNOSIS — Z5181 Encounter for therapeutic drug level monitoring: Secondary | ICD-10-CM

## 2024-08-19 DIAGNOSIS — Z170 Estrogen receptor positive status [ER+]: Secondary | ICD-10-CM | POA: Diagnosis not present

## 2024-08-19 DIAGNOSIS — Z79811 Long term (current) use of aromatase inhibitors: Secondary | ICD-10-CM | POA: Insufficient documentation

## 2024-08-19 DIAGNOSIS — C7951 Secondary malignant neoplasm of bone: Secondary | ICD-10-CM | POA: Diagnosis present

## 2024-08-19 DIAGNOSIS — C773 Secondary and unspecified malignant neoplasm of axilla and upper limb lymph nodes: Secondary | ICD-10-CM | POA: Insufficient documentation

## 2024-08-19 LAB — CMP (CANCER CENTER ONLY)
ALT: 27 U/L (ref 0–44)
AST: 25 U/L (ref 15–41)
Albumin: 4.1 g/dL (ref 3.5–5.0)
Alkaline Phosphatase: 46 U/L (ref 38–126)
Anion gap: 9 (ref 5–15)
BUN: 12 mg/dL (ref 8–23)
CO2: 23 mmol/L (ref 22–32)
Calcium: 9.1 mg/dL (ref 8.9–10.3)
Chloride: 105 mmol/L (ref 98–111)
Creatinine: 0.91 mg/dL (ref 0.44–1.00)
GFR, Estimated: >60 mL/min (ref >60)
Glucose, Bld: 108 mg/dL — ABNORMAL HIGH (ref 70–99)
Potassium: 3.9 mmol/L (ref 3.5–5.1)
Sodium: 137 mmol/L (ref 135–145)
Total Bilirubin: 1 mg/dL (ref 0.0–1.2)
Total Protein: 7.5 g/dL (ref 6.5–8.1)

## 2024-08-19 LAB — CBC WITH DIFFERENTIAL (CANCER CENTER ONLY)
Abs Immature Granulocytes: 0.01 K/uL (ref 0.00–0.07)
Basophils Absolute: 0 K/uL (ref 0.0–0.1)
Basophils Relative: 1 %
Eosinophils Absolute: 0 K/uL (ref 0.0–0.5)
Eosinophils Relative: 0 %
HCT: 42.6 % (ref 36.0–46.0)
Hemoglobin: 13.8 g/dL (ref 12.0–15.0)
Immature Granulocytes: 0 %
Lymphocytes Relative: 43 %
Lymphs Abs: 2.5 K/uL (ref 0.7–4.0)
MCH: 27.8 pg (ref 26.0–34.0)
MCHC: 32.4 g/dL (ref 30.0–36.0)
MCV: 85.7 fL (ref 80.0–100.0)
Monocytes Absolute: 0.4 K/uL (ref 0.1–1.0)
Monocytes Relative: 7 %
Neutro Abs: 2.9 K/uL (ref 1.7–7.7)
Neutrophils Relative %: 49 %
Platelet Count: 312 K/uL (ref 150–400)
RBC: 4.97 MIL/uL (ref 3.87–5.11)
RDW: 13.8 % (ref 11.5–15.5)
WBC Count: 5.8 K/uL (ref 4.0–10.5)
nRBC: 0 % (ref 0.0–0.2)

## 2024-08-19 MED ORDER — ZOLEDRONIC ACID 4 MG/100ML IV SOLN
4.0000 mg | Freq: Once | INTRAVENOUS | Status: AC
  Administered 2024-08-19: 4 mg via INTRAVENOUS
  Filled 2024-08-19: qty 100

## 2024-08-19 MED ORDER — SODIUM CHLORIDE 0.9 % IV SOLN
INTRAVENOUS | Status: DC
  Filled 2024-08-19 (×2): qty 250

## 2024-08-19 NOTE — Progress Notes (Signed)
Patient is doing ok.

## 2024-08-19 NOTE — Progress Notes (Signed)
 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  INTERVAL HISTORY: Patient returns to clinic today for routine 44-month evaluation and continuation of Zometa .  She continues to tolerate letrozole  without significant side effects.  She currently feels well and is asymptomatic.  She has persistent left arm lymphedema which is well-controlled with sleeves.  She has no neurologic complaints. She denies any recent fevers or illnesses.  She has a good appetite and denies weight loss.  She has no chest pain, shortness of breath, cough, or hemoptysis.  She denies any nausea, vomiting, constipation, or diarrhea.  She has no urinary complaints.  [name]  REVIEW OF SYSTEMS:   Review of Systems  Constitutional: Negative.  Negative for fever, malaise/fatigue and weight loss.  Respiratory: Negative.  Negative for cough, hemoptysis and shortness of breath.   Cardiovascular: Negative.  Negative for chest pain and leg swelling.  Gastrointestinal: Negative.  Negative for abdominal pain.  Genitourinary: Negative.  Negative for dysuria.  Musculoskeletal: Negative.  Negative for back pain and joint pain.  Skin: Negative.  Negative for rash.  Neurological: Negative.  Negative for dizziness, focal weakness, weakness and headaches.  Psychiatric/Behavioral: Negative.  The patient is not nervous/anxious.     As per HPI. Otherwise, a complete review of systems is negative.  PAST MEDICAL HISTORY: Past Medical History:  Diagnosis Date   Anxiety    Arthritis     Diet-controlled diabetes mellitus (HCC)    borderline   Fatigue    HLD (hyperlipidemia)    Invasive carcinoma of breast (HCC) 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; ER/PR (+), HER2/neu (-)   Metastasis to bone (HCC)    Breast primary   Murmur, cardiac    Peripheral edema    T wave inversion on electrocardiogram 05/17/2020   leads I, II, III, aVF, V3-V6    PAST SURGICAL HISTORY: Past Surgical History:  Procedure Laterality Date   BREAST BIOPSY Right 05/02/2020   US  Bx, Q clip, path pending   BREAST BIOPSY Right 05/02/2020   US  Axilla Bx, path pending   CHOLECYSTECTOMY     COLONOSCOPY     LAPAROSCOPIC ABDOMINAL EXPLORATION     MASTECTOMY MODIFIED RADICAL Right 05/23/2020   Procedure: MASTECTOMY MODIFIED RADICAL, WITH EXCISION OF AXILLARY CONTENTS;  Surgeon: Dessa Reyes ORN, MD;  Location: [facility] ORS;  Service: General;  Laterality: Right;   PORTACATH PLACEMENT Left 05/23/2020   Procedure: INSERTION PORT-A-CATH;  Surgeon: Dessa Reyes ORN, MD;  Location: [facility] OR[facility];  Service: General;  Laterality: Left;    FAMILY HISTORY: Family History  Problem Relation Age of Onset   Breast cancer Sister     ADVANCED DIRECTIVES (Y/N):  N  HEALTH MAINTENANCE: Social History   Tobacco Use   Smoking status: Never   Smokeless tobacco: Never  Vaping Use   Vaping status: Never Used  Substance Use Topics   Alcohol use: Never   Drug use: Never     Colonoscopy:  PAP:  Bone density:  Lipid panel:  Allergies  Allergen Reactions   Nickel     Current Outpatient Medications  Medication Sig Dispense Refill   Calcium Carbonate-Vitamin  D 600-400 MG-UNIT tablet Take by mouth.     letrozole  (FEMARA ) 2.5 MG tablet TAKE 1 TABLET(2.5 MG) BY MOUTH DAILY 90 tablet 3   lidocaine -prilocaine  (EMLA ) cream APPLY A SMALL AMOUNT TO PORT SITE AT LEAST 1 HOUR PRIOR TO IT BEING ACCESSED THEN COVER WITH PLASTIC WRAP 30 g 3   lisinopril (ZESTRIL) 5 MG tablet Take 1 tablet by mouth at bedtime.     lovastatin (MEVACOR) 40  MG tablet Take 1 tablet by mouth at bedtime.     metFORMIN (GLUCOPHAGE) 500 MG tablet Take 500 mg by mouth 2 (two) times daily with a meal.     metoprolol  succinate (TOPROL -XL) 25 MG 24 hr tablet Take 25 mg by mouth daily.     No current facility-administered medications for this visit.    OBJECTIVE: Vitals:   08/19/24 0958  BP: 138/66  Pulse: 79  Resp: 18  Temp: 97.9 F (36.6 C)  SpO2: 99%     Body mass index is 29.63 kg/m.    ECOG FS:0 - Asymptomatic  General: Well-developed, well-nourished, no acute distress. Eyes: Pink conjunctiva, anicteric sclera. HEENT: Normocephalic, moist mucous membranes. Lungs: No audible wheezing or coughing. Heart: Regular rate and rhythm. Abdomen: Soft, nontender, no obvious distention. Musculoskeletal: No edema, cyanosis, or clubbing. Neuro: Alert, answering all questions appropriately. Cranial nerves grossly intact. Skin: No rashes or petechiae noted. Psych: Normal affect.  LAB RESULTS:  Lab Results  Component Value Date   NA 137 08/19/2024   K 3.9 08/19/2024   CL 105 08/19/2024   CO2 23 08/19/2024   GLUCOSE 108 (H) 08/19/2024   BUN 12 08/19/2024   CREATININE 0.91 08/19/2024   CALCIUM 9.1 08/19/2024   PROT 7.5 08/19/2024   ALBUMIN 4.1 08/19/2024   AST 25 08/19/2024   ALT 27 08/19/2024   ALKPHOS 46 08/19/2024   BILITOT 1.0 08/19/2024   GFRNONAA >60 08/19/2024   GFRAA >60 06/18/2020    Lab Results  Component Value Date   WBC 5.8 08/19/2024   NEUTROABS 2.9 08/19/2024   HGB 13.8 08/19/2024   HCT 42.6 08/19/2024   MCV 85.7 08/19/2024   PLT 312 08/19/2024     STUDIES: No results found.  ONCOLOGY HISTORY: PET scan results from May 16, 2020 reviewed independently with intensely hypermetabolic right breast mass with metastatic lesions in bilateral axillary lymph node as well as multifocal hypermetabolic bone metastasis.  Patient underwent palliative mastectomy with bilateral lymph node sampling on May 23, 2020.  We  discussed initiating treatment with Ibrance, but patient declined and only agreed to letrozole  only and Zometa . Patient initiated monthly Zometa  on June 18, 2020.   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  Her most recent result is 51.8.  Today's result is pending.  Previously, we discussed repeating imaging, but ultimately agreed to only do repeat imaging if her tumor marker started trending up or she became symptomatic.  We also previously discussed the option of adding in chemotherapy with oral Ibrance or Kisqali, but patient continues to not be interested in adding additional treatment.  Continue with letrozole  only indefinitely or until progression of disease.  Proceed with Zometa  today.  Return to clinic in 3 months with repeat laboratory, further evaluation, and continuation of  treatment.   Anxiety: Significantly improved.  Continue Xanax  as needed. Genetic testing: Patient previously declined. Right arm lymphedema: Chronic and unchanged.  Continue sleeve and wrap as per lymphedema clinic instructions.  I spent a total of 30 minutes reviewing chart data, face-to-face evaluation with the patient, counseling and coordination of care as detailed above.  Patient expressed understanding and was in agreement with this plan. She also understands that She can call clinic at any time with any questions, concerns, or complaints.    Cancer Staging  Breast cancer, right [facility]) Staging form: Breast, AJCC 8th Edition - Clinical: Stage IV (cT4b, cN2, cM1, ER+, PR+, HER2-) - Signed by Jacobo Evalene PARAS, MD on 05/17/2020 Stage prefix: Initial diagnosis   Evalene PARAS Jacobo, MD   08/19/2024 10:14 AM

## 2024-08-22 LAB — CA 27.29 (SERIAL MONITOR): CA 27.29: 55.4 U/mL — ABNORMAL HIGH (ref 0.0–38.6)

## 2024-11-10 NOTE — H&P (Signed)
 ------------------------------------------------------------------------------- Attestation signed by Cetrone, Emily Diane, MD at 11/11/24 [address] HERO Finks is currently being treated primarily for Pleural effusion on right , complicated by post thoracentesis pneumothorax, now worsening. Therefore, she is transferring to [facility] so she can be under the care of CT surgery.   I saw and evaluated the patient 2/6, participating in the key portions of the service.  I reviewed the residents note.  I agree with the residents findings and plan.  Issues Impacting Complexity of Management <redacted file path>: <redacted file path>   -Need for escalation to higher hospital-level of care from [facility] to [facility] for access to specialized services and increased monitoring -Need for intensive oxygen therapy of >/= 10L, which places the patient at high risk for oxygen toxicity -The patient is at high risk of complications from pneumothorax  Medical Decision Making : Independently interpreted serial chest x-rays notable for worsening pneumothorax.  Damien JONETTA Barrack, MD  -------------------------------------------------------------------------------  Geriatrics (MEDA) History & Physical  Assessment & Plan:  Kristy York is a 74 y.o. 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   Principal Problem:   Pleural effusion on right Active Problems:   Carcinoma of right breast metastatic to bone (CMS-HCC)   LVH (left ventricular hypertrophy)   Port-A-Cath in place   Pure hypercholesterolemia   T wave inversion on electrocardiogram   Type 2 diabetes  mellitus with hyperlipidemia (CMS-HCC)   SOB (shortness of breath)   Pneumothorax on right  Active Problems  Large Exudative Right Pleural Effusion s/p Thoracentesis  Suspicion for Re-Expansion Pulmonary Edema Presented with a few weeks of dyspnea on exertion, productive cough. CXR demonstrated complete opacification of right hemithorax, ED POCUS with large simple pleural effusion with underlying lung collapse. SOB improved following thoracentesis with removal of 2L amber-colored translucent fluid, with analysis c/w exudative effusion. Concern for malignant etiology (given metastatic breast cancer), Ddx includes infection (less likely given afebrile, no leukocytosis, RPP negative, no focal ), PE (given hx of hypercoagulable state iso cancer, though no e/o DVT on exam and tachycardia/dyspnea resolved with thoracentesis), iatrogenic (no hx radiation or trauma to pleural space), no chylothorax, no hx or sxs of autoimmune / connective tissue disease. Repeat POCUS with small simple right pleural effusion, few B-lines on the right, no pleural sliding on right (present on left). C/f re-expansion pulmonary edema vs atelectasis given new crackles/rhonchi and unable to wean O2. Effusion stable on serial POCUS. - Pleural fluid analysis of protein/LDH/glc/cholesterol/glucose indicative of exudative pleural effusion with >1 Light's criteria met: - protein 4.2 g/dL  - LDH 870 U/L - glc 886 mg/dL - triglycerides 22 mg/dL  - cholesterol 46 mg/dL - pleural fluid culture and cytology in process - Pulmonology consult pending course - Serial CXR/POCUS - CT chest - Daily CBC, BMP, Mg - Guaifenesin sch - acetaminophen  1000 mg prn - oxycodone 2.5-5mg  PRN  Small Right Iatrogenic Pneumothorax Repeat CXR following thoracentesis showed improved R lung aeration, small right pneumothorax, right neck and lateral chest wall subcutaneous emphysema, bibasilar subsegmental atelectasis. C/f iatrogenic pneumothorax vs ex vacuo  with nonexpandable lung 2/2 large-volume thora vs malignancy. Per ED, was satting  well on room air before and after thoracentesis, was placed on 4L [place] to aid lung re-expansion. On post-thora exam, new crackles bilaterally, with crepitation on palpation of right lateral chest wall. De-sat to 83% on RA with trial O2 wean. Given pneumothorax <15% of hemithorax and patient stability, continued with conservative management and monitoring. Started non-rebreather for enhanced pneumothorax resorption rate. Attempts to obtain ABG unsuccessful. Patient remains clinically stable without worsening symptoms. - CXR q6h to monitor pneumothorax, pleural effusion - VBG - NRB  Stage IV ER/PR(+), HER-2(-) Breast Cancer with Metastasis to Bone  RUE Lymphedema Follows with Dr. Evalene Reusing at [facility]. Hx of stage IV invasive carcinoma of the right breast (cT4b, cN2, cM1, ER+, PR+, HER2-) diagnosed 05/2020, with PET scan at the time showing hypermetabolic bilateral axillary lymph node metastases, multifocal sclerotic hypermetabolic bone metastases in lumbar spine/sacrum/bilateral pelvic girdle. S/p palliative right modified radical mastectomy 05/23/20, port placement, and initiation of daily letrozole  with Q12w IV Zometa  for bone mets, both of which she is still taking. Per Dr. Jerone notes, no hx of treatment with chemo or radiation, previously declined Ibrance/Kisqali or additional PET/mammogram, with preference for conservative monitoring as long as she is asymptomatic.  Last visit with Dr. Reusing in 11/25, with discussion of CA 27.29 tumor marker fluctuating between 41.1 and 68.0 and decision to hold off on repeat imaging unless CA 27.29 started trending up or she developed new sxs. Most recent CA 27.29 of 51.8 on 08/19/24. Last Zometa  infusion on 08/19/24, next due 11/22/24. Denies pain. Patient states she is open to pursuing further diagnostic evaluation and treatment such as  chemotherapy/targeted therapies, if recommended by her Oncologist. On discussion of code status, she initially states DNR/DNI, then states full code because she has her son.  - Notify Dr. Reusing in AM - Further discussion regarding imaging and treatment goals - CA 27.29  Type 2 MI  History of T Wave Inversions  Left Ventricular Hypertrophy Hx of marked diffuse TWI in leads I, II, III, aVF, V3-V6, first noted on 05/2020 EKG. Following with [facility]. 05/2020 2D echo showed normal left ventricular function with LVEF > 55% with normal wall motion with mild to moderate concentric LVH with mild mitral regurgitation. TWI felt likely early repolarization secondary to LVH. Was started on metop and lisinopril. Last echo 09/22/24 with hypercontractile LV and mild LVH, EF >70%. POCUS with hyperdynamic LV and trace pericardial effusion, no e/o elevated RA pressure. Troponin on admission 37 > 42 > 39. EKG with sinus tachycardia, premature atrial beats, septal infarct, ST wave abnormalities, TWI. Most likely demand ischemia, denies chest pain or anginal equivalents. Tachycardia resolved. - Continue metoprolol  - Continue lisonpril - TTE  AGMA Anion gap 16, bicarb 30, possibly mixed process. - VBG, lactate - Hold metformin  Decreased Functional Status Baseline independent in ADLs and most IADLs (her sister drives her), requiring more assistance with ADLs recently iso dyspnea. Grossly oriented, no hx dementia.  - PT/OT evaluation - Delirium precautions in place - Melatonin scheduled  - Miralax scheduled - senna prn    Mentation CAM:   CAM: Negative 6CIT:   Not yet completed (please touch base with nursing) Delirium Order Set: Ordered (appropriate for any patient >65) Dementia: No. Behavioral Symptoms (baseline or now): No.  Mobility Baseline functional status: Independent in ADLs, does not drive, ambulatory with cane Current functional status: Needs Assistance with ADLs PT/OT Ordered:  yes  Medications Reviewed home medications, screening for potentially inappropriate medications: Yes  Medications recommended de-prescribing: n/a  What Matters Geri Assessment complete: No, needs to be done She lives with her son and is very close with her sister. She enjoys good food, watching westerns and game shows, gardening outside.    Chronic Problems  T2DM Good glycemic control on metformin 500mg  BID. HbA1c 6.1% in 08/2024. - Hold metformin  - SSI  HLD Well-managed on lovastatin 40mg  nightly. Last lipid panel 08/2024 WNL. - pravastatin 40mg  nightly (formulary equivalent)  The patient's presentation is complicated by the following clinically significant conditions requiring additional evaluation and treatment: - Disorders of electrolytes, volume status, and acid/base status: - Hypokalemia and - Dehydration requiring further investigation, treatment, or monitoring - Hypercoagulable state requiring additional attention to DVT prophylaxis and treatment or chronic anticoagulation secondary to Malignancy  - Age related debility POA requiring additional resources: DME, PT, or OT - Metastatic cancer POA requiring further investigation, treatment, or monitoring   Issues Impacting Complexity of Management: <redacted file path> -The patient is at high risk from Hospital immobility in an elderly patient given baseline poor functional status with a high risk of causing delirium and further decline in function  Medical Decision Making: Reviewed records from the following unique sources Care Everywhere. History provided by both patient and additional information provided by son, Irelyn Perfecto. Discussed the patient's management and/or test interpretation with ED Provider and Pulmonology as summarized within this note   Checklist: Diet: Regular Diet and NPO at MN DVT PPx: Lovenox 40mg  q24h, HOLD for possible procedure Code Status: Full Code Dispo: Patient appropriate for Inpatient observation  based on expectation of ongoing need for hospitalization greater than two midnights and severity of presentation/services including monitoring right-sided pleural effusion and pneumothorax  Team Contact Information:  Primary Team: Geriatrics (MEDA) Primary Resident: Hadassah Pellet, MD Resident's Pager: [phone] (Geriatrics Intern - [place])  Chief Concern:  Pleural effusion on right  Subjective:  Kristy York is a 74 y.o. 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  History obtained by patient and her son.   HPI: History of Present Illness Kristy York is a 74 year old female with a history of breast cancer who presents with shortness of breath and cough. She is accompanied by her son, Layci Stenglein.  She has been experiencing shortness of breath for the past couple of weeks, which has worsened over the last few days. She has difficulty breathing when walking short distances, such as 10 to 15 feet, and struggles to breathe even when sitting down. She has difficulty sleeping on her right side due to breathing issues, which have been present for a couple of weeks. No chest pain, palpitations, or fever. She has experienced some coughing, mostly to clear her throat of phlegm, described as clear.  She has a history of breast cancer and underwent a mastectomy.   She has a history of a heart murmur and had an echocardiogram three weeks ago. No new swelling in her legs, though she had left ankle swelling about a month ago, which resolved after a few days with reduced sodium intake and massage. No pain in her legs or abdomen. Has daily BM. No recent prolonged immobility, road trip, or flight. No recent illness  or known sick contact.  She is currently on letrozole  2.5 mg daily for cancer treatment and receives quarterly infusions of Zometa . She has not undergone chemotherapy or radiation therapy. She also takes metformin 500 mg twice daily, lisinopril 5 mg at bedtime, lovastatin 40 mg at bedtime, and metoprolol  25 mg daily.  Her social history includes living with her son and her sister, who assist with daily activities. She uses a cane for mobility and enjoys watching Westerns and game shows. She enjoys eating and especially likes [employer]eps[employer], although she has reduced her consumption in the setting of her diabetes. She confirms full code status, states that she would want chest compressions / shocks / intubation / escalation to ICU if necessary.   In the ED: Vitals: On arrival, Temp: 36.8 C (98.2 F), BP: (!) 167/79, HR 114, RR 20, SpO2: 95 % on RA Labs: CBC with elevated RBC/Hgb/hct. CMP with K 3.3, AG 16, BUN 6, AST 45. LDH 213, Mg 1.7. hsTrop of 37 > 42.  Micro: RPP negative. Pleural fluid culture in process EKG: ABNORMAL ECG w/ SINUS TACHYCARDIA WITH PREMATURE ATRIAL BEATS. SEPTAL INFARCT. ST-T WAVE ABNORMALITIES, CONSIDER ISCHEMIA. Imaging:  CXR 1512: Complete opacification of right hemithorax with no tracheal or mediastinal shift. Visualized spine appears diffusely sclerotic, likely blastic metastasis. ED POCUS 1527: No sonographic evidence of significant cardiac dysfunction, No sonographic evidence of significant pericardial effusion, Normal RV, and No sonographic evidence of volume depletion. Large right-sided pleural effusion. CXR 1904: Bibasilar subsegmental atelectasis now noted. There is marked interval decrease in a large right pleural effusion with improved aeration of the right lung. A trace pleural effusion remains on the right. There is now a small right pneumothorax. Right neck and lateral chest wall subcutaneous emphysema.  Interventions: thoracentesis w/ 2L drained   Pertinent Surgical  Hx  CHOLECYSTECTOMY       LAPAROSCOPIC ABDOMINAL EXPLORATION       MASTECTOMY MODIFIED RADICAL Right 05/23/2020    Procedure: MASTECTOMY MODIFIED RADICAL, WITH EXCISION OF AXILLARY CONTENTS;  Surgeon: Dessa Reyes ORN, MD;  Location: [facility] ORS;  Service: General;  Laterality: Right;   PORTACATH PLACEMENT Left 05/23/2020    Procedure: INSERTION PORT-A-CATH;  Surgeon: Dessa Reyes ORN, MD;  Location: [facility] ORS;  Service: General;  Laterality: Left;   Pertinent Family Hx Breast cancer in sister  Pertinent Social Hx  Lives with her son and her sister. Hx tobacco use (8 pack-yr, quit ~2005).  Social History[1]   Allergies Nickel  I reviewed the Medication List. The current list is Accurate Prior to Admission medications  Medication Dose, Route, Frequency  letrozole  (FEMARA ) 2.5 mg tablet 2.5 mg, Daily (standard)  lisinopril (PRINIVIL,ZESTRIL) 5 MG tablet 5 mg, Nightly  lovastatin (MEVACOR) 40 MG tablet 40 mg, Nightly  metFORMIN (GLUCOPHAGE) 500 MG tablet 500 mg, 2 times a day with meals  metoPROLOL  succinate (TOPROL -XL) 25 MG 24 hr tablet 25 mg, Daily (standard)    Designated Healthcare Decision Maker: Ms. Kjos currently has decisional capacity for healthcare decision-making and is able to designate a surrogate healthcare decision maker. Ms. Pfefferkorn designated healthcare decision maker(s) is [name] DUVELL Wellman (the patient's adult child) as denoted by stated patient preference.  Objective:  Physical Exam: Temp:  [36 C (96.8 F)-37.2 C (98.9 F)] 36 C (96.8 F) Pulse:  [82-114] 82 SpO2 Pulse:  [77-93] 77 Resp:  [18-41] 41 BP: (116-167)/(45-90) 116/45 SpO2:  [83 %-98 %] 98 %  Gen: NAD, converses  Eyes: Sclera anicteric, EOMI grossly normal  HENT:  Atraumatic, normocephalic Neck: Trachea midline Heart: RRR, no m/r/g Lungs: no increased WOB, + crackles and rhonchi in all lung fields, upper airway secretions Abdomen: Soft, distended, nontender Extremities: Right arm  compression sleeve. Upper extremities are warm and well-perfused. Bilateral lower extremities cool to the touch, dry, symmetric, nontender, no swelling. Neuro: Grossly symmetric, non-focal   Skin:  No rashes, lesions on clothed exam. Crepitation on palpation of right lateral chest wall Psych: Alert, oriented   Ronnald Hoops, MS3  Hadassah LITTIE Pellet, MD Internal Medicine, PGY-2 11/11/24  I attest that I have reviewed the medical student note and that the components of the history of the present illness, the physical exam, and the assessment and plan documented were performed by me or were performed in my presence by the student where I verified the documentation and performed (or re-performed) the exam and medical decision making. Hadassah LITTIE Pellet, MD       [1] Social History Socioeconomic History   Marital status: Widowed    Spouse name: None   Number of children: None   Years of education: None   Highest education level: None   Social Drivers of Health   Food Insecurity: No Food Insecurity (03/03/2024)   Received [facility]   Hunger Vital Sign    Within the past 12 months, you worried that your food would run out before you got the money to buy more.: Never true    Within the past 12 months, the food you bought just didn't last and you didn't have money to get more.: Never true  Tobacco Use: Low Risk (08/19/2024)   Received from [facility]   Patient History    Smoking Tobacco Use: Never    Smokeless Tobacco Use: Never  Recent Concern: Tobacco Use - Medium Risk (08/18/2024)   Received from [facility]   Patient History    Smoking Tobacco Use: Former    Smokeless Tobacco Use: Never    Passive Exposure: Past  Transportation Needs: No Transportation Needs (03/03/2024)   Received from [facility]   PRAPARE - Transportation    In the past 12 months, has lack of transportation kept you from medical appointments or  from getting medications?: No    Lack of Transportation (Non-Medical): No  Housing: Low Risk  (03/03/2024)   Received from [facility]   Housing Stability Vital Sign    In the last 12 months, was there a time when you were not able to pay the mortgage or rent on time?: No    In the past 12 months, how many times have you moved where you were living?: 0    At any time in the past 12 months, were you homeless or living in a shelter (including now)?: No  Utilities: Not At Risk (03/03/2024)   Received from [facility]    Threatened with loss of utilities: No  Financial Resource Strain: Low Risk  (03/03/2024)   Received from [facility]   Overall Financial Resource Strain (CARDIA)    Difficulty of Paying Living Expenses: Not hard at all

## 2024-11-11 ENCOUNTER — Telehealth: Payer: Self-pay | Admitting: *Deleted

## 2024-11-11 NOTE — Telephone Encounter (Signed)
 Per Dr. Jacobo. Md spoke with Dr. Darra at [facility]. Pt has a large pleural effusion. Patient is admitted to [facility]. If patient calls, plan is to keep her follow-up appointment already scheduled on February 17.

## 2024-11-22 ENCOUNTER — Inpatient Hospital Stay

## 2024-11-22 ENCOUNTER — Inpatient Hospital Stay: Admitting: Oncology
